# Patient Record
Sex: Male | Born: 1950 | Race: Black or African American | Hispanic: No | Marital: Single | Smoking: Current every day smoker
Health system: Southern US, Community
[De-identification: ages and names within clinical notes are randomized; demographics above are authoritative.]

## PROBLEM LIST (undated history)

## (undated) DIAGNOSIS — J189 Pneumonia, unspecified organism: Secondary | ICD-10-CM

## (undated) DIAGNOSIS — I10 Essential (primary) hypertension: Secondary | ICD-10-CM

## (undated) DIAGNOSIS — N400 Enlarged prostate without lower urinary tract symptoms: Secondary | ICD-10-CM

## (undated) DIAGNOSIS — F1010 Alcohol abuse, uncomplicated: Secondary | ICD-10-CM

## (undated) DIAGNOSIS — B351 Tinea unguium: Secondary | ICD-10-CM

## (undated) DIAGNOSIS — E782 Mixed hyperlipidemia: Secondary | ICD-10-CM

## (undated) HISTORY — DX: Alcohol abuse, uncomplicated: F10.10

## (undated) HISTORY — PX: TUBAL LIGATION: SHX77

---

## 2004-04-02 ENCOUNTER — Encounter: Payer: Self-pay | Admitting: Emergency Medicine

## 2004-04-03 ENCOUNTER — Inpatient Hospital Stay (HOSPITAL_COMMUNITY): Admission: EM | Admit: 2004-04-03 | Discharge: 2004-04-06 | Payer: Self-pay | Admitting: Emergency Medicine

## 2004-04-03 ENCOUNTER — Ambulatory Visit: Payer: Self-pay | Admitting: Internal Medicine

## 2004-04-21 ENCOUNTER — Ambulatory Visit (HOSPITAL_COMMUNITY): Admission: RE | Admit: 2004-04-21 | Discharge: 2004-04-21 | Payer: Self-pay | Admitting: Internal Medicine

## 2004-04-21 ENCOUNTER — Ambulatory Visit: Payer: Self-pay | Admitting: Internal Medicine

## 2004-05-10 ENCOUNTER — Emergency Department (HOSPITAL_COMMUNITY): Admission: EM | Admit: 2004-05-10 | Discharge: 2004-05-10 | Payer: Self-pay | Admitting: Emergency Medicine

## 2004-05-14 ENCOUNTER — Emergency Department (HOSPITAL_COMMUNITY): Admission: EM | Admit: 2004-05-14 | Discharge: 2004-05-14 | Payer: Self-pay | Admitting: Emergency Medicine

## 2004-06-09 ENCOUNTER — Ambulatory Visit: Payer: Self-pay | Admitting: Internal Medicine

## 2004-06-26 ENCOUNTER — Ambulatory Visit (HOSPITAL_COMMUNITY): Admission: RE | Admit: 2004-06-26 | Discharge: 2004-06-26 | Payer: Self-pay | Admitting: Internal Medicine

## 2004-06-26 ENCOUNTER — Ambulatory Visit: Payer: Self-pay | Admitting: Internal Medicine

## 2013-04-10 ENCOUNTER — Emergency Department (HOSPITAL_COMMUNITY): Payer: Self-pay

## 2013-04-10 ENCOUNTER — Emergency Department (HOSPITAL_COMMUNITY)
Admission: EM | Admit: 2013-04-10 | Discharge: 2013-04-10 | Disposition: A | Discharge status: Home / Self Care | Payer: Self-pay | Attending: Emergency Medicine | Admitting: Emergency Medicine

## 2013-04-10 ENCOUNTER — Encounter (HOSPITAL_COMMUNITY): Payer: Self-pay | Admitting: Emergency Medicine

## 2013-04-10 DIAGNOSIS — K118 Other diseases of salivary glands: Secondary | ICD-10-CM | POA: Insufficient documentation

## 2013-04-10 DIAGNOSIS — R911 Solitary pulmonary nodule: Secondary | ICD-10-CM | POA: Insufficient documentation

## 2013-04-10 DIAGNOSIS — F17200 Nicotine dependence, unspecified, uncomplicated: Secondary | ICD-10-CM | POA: Insufficient documentation

## 2013-04-10 LAB — COMPREHENSIVE METABOLIC PANEL
ALT: 8 U/L (ref 0–53)
AST: 15 U/L (ref 0–37)
Albumin: 3.8 g/dL (ref 3.5–5.2)
Alkaline Phosphatase: 74 U/L (ref 39–117)
BUN: 13 mg/dL (ref 6–23)
CO2: 24 mEq/L (ref 19–32)
CREATININE: 0.84 mg/dL (ref 0.50–1.35)
Calcium: 8.7 mg/dL (ref 8.4–10.5)
Chloride: 104 mEq/L (ref 96–112)
GFR calc Af Amer: >90 mL/min (ref >90)
Glucose, Bld: 75 mg/dL (ref 70–99)
Potassium: 4.1 mEq/L (ref 3.7–5.3)
Sodium: 143 mEq/L (ref 137–147)
Total Bilirubin: 0.3 mg/dL (ref 0.3–1.2)
Total Protein: 7.9 g/dL (ref 6.0–8.3)

## 2013-04-10 LAB — CBC WITH DIFFERENTIAL/PLATELET
BASOS PCT: 0 % (ref 0–1)
Basophils Absolute: 0 10*3/uL (ref 0.0–0.1)
Eosinophils Absolute: 0.2 10*3/uL (ref 0.0–0.7)
Eosinophils Relative: 3 % (ref 0–5)
HCT: 40 % (ref 39.0–52.0)
Hemoglobin: 13.6 g/dL (ref 13.0–17.0)
Lymphocytes Relative: 32 % (ref 12–46)
Lymphs Abs: 2.1 10*3/uL (ref 0.7–4.0)
MCH: 30.7 pg (ref 26.0–34.0)
MCHC: 34 g/dL (ref 30.0–36.0)
MCV: 90.3 fL (ref 78.0–100.0)
Monocytes Absolute: 0.7 10*3/uL (ref 0.1–1.0)
Monocytes Relative: 10 % (ref 3–12)
Neutro Abs: 3.6 10*3/uL (ref 1.7–7.7)
Neutrophils Relative %: 55 % (ref 43–77)
Platelets: 201 10*3/uL (ref 150–400)
RBC: 4.43 MIL/uL (ref 4.22–5.81)
RDW: 14.8 % (ref 11.5–15.5)
WBC: 6.5 10*3/uL (ref 4.0–10.5)

## 2013-04-10 MED ORDER — IOHEXOL 300 MG/ML  SOLN
75.0000 mL | Freq: Once | INTRAMUSCULAR | Status: AC | PRN
  Administered 2013-04-10: 75 mL via INTRAVENOUS

## 2013-04-10 NOTE — ED Provider Notes (Signed)
Medical screening examination/treatment/procedure(s) were conducted as a shared visit with non-physician practitioner(s) and myself.  I personally evaluated the patient during the encounter.  EKG Interpretation   None       Patient seen by me. Patient with mass inferior and anterior to the right ear. Not painful nontender no redness concerning for tumor. We'll get CT scan with contrast to further evaluate. Patient states it's been there for only a couple months but is suspicious for having been there for a longer period of time. Patient nontoxic no acute distress. Vital signs normal except for a mild tachycardia.  Mervin Kung, MD 04/10/13 1247

## 2013-04-10 NOTE — ED Provider Notes (Signed)
CSN: [id number]     Arrival date & time 04/10/13  1119 History  This chart was scribed for non-physician practitioner Margarita Mail, PA-C  working with Mervin Kung, MD by Mercy Moore, ED Scribe. This patient was seen in room TR09C/TR09C and the patient's care was started at 12:17 AM.    Chief Complaint  Patient presents with  . Cyst    The history is provided by the patient. No language interpreter was used.   HPI Comments: Robert Molina is a 63 y.o. male who presents to the Emergency Department complaining of constant mass inferior to the right ear that began about 3 months ago. Pt states the area is not painful but has gradually increased in size with time. He denies fever, chills, diaphoresis, sudden weight loss, or sore throat. Pt is a current everyday smoker and occasional alcohol user.   History reviewed. No pertinent past medical history. History reviewed. No pertinent past surgical history. History reviewed. No pertinent family history. History  Substance Use Topics  . Smoking status: Current Every Day Smoker  . Smokeless tobacco: Not on file  . Alcohol Use: Yes    Review of Systems  Constitutional: Negative for fever, chills, diaphoresis and unexpected weight change.  HENT: Negative for sore throat.     Allergies  Review of patient's allergies indicates no known allergies.  Home Medications  No current outpatient prescriptions on file.  Triage Vitals: BP 130/73  Pulse 105  Temp(Src) 98.9 F (37.2 C) (Oral)  Resp 20  Ht 5\' 11"  (1.803 m)  Wt 141 lb 9 oz (64.212 kg)  BMI 19.75 kg/m2  SpO2 96% Physical Exam  Nursing note and vitals reviewed. Constitutional: He is oriented to person, place, and time. He appears well-developed and well-nourished. No distress.  HENT:  Head: Normocephalic and atraumatic.  Approximately 7 cm firm mass inferior to the right ear. It is non-fluctant.   Eyes: Conjunctivae are normal. Right eye exhibits no discharge. Left eye  exhibits no discharge.  Neck: Normal range of motion.  Cardiovascular: Normal rate, regular rhythm and normal heart sounds.   Pulmonary/Chest: Effort normal and breath sounds normal. No respiratory distress. He has no wheezes. He has no rales.  Musculoskeletal: Normal range of motion. He exhibits no edema.  Lymphadenopathy:       Right: Supraclavicular adenopathy present.  Neurological: He is alert and oriented to person, place, and time.  Skin: Skin is warm and dry.  Psychiatric: He has a normal mood and affect. Thought content normal.    ED Course  Procedures (including critical care time) DIAGNOSTIC STUDIES: Oxygen Saturation is 96% on room air, adequate by my interpretation.    COORDINATION OF CARE: 12:21 AM- Pt advised of plan for treatment and pt agrees.   Labs Review Labs Reviewed - No data to display Imaging Review Dg Chest 2 View  04/10/2013   CLINICAL DATA:  Swelling to the right side of the neck, hypertension, history of smoking  EXAM: CHEST  2 VIEW  COMPARISON:  04/21/2004; 04/05/2004; 04/02/2004  FINDINGS: Grossly unchanged cardiac silhouette and mediastinal contours. The lungs are hyperexpanded with flattening of the bilateral hemidiaphragms. Interval resolution of previously noted heterogeneous airspace opacities within the peripheral aspect of the right mid lung. No new focal airspace opacities. No pleural effusion or pneumothorax. No definite evidence of edema. Grossly unchanged bones.  IMPRESSION: Hyperexpanded lungs without acute cardiopulmonary disease.   Electronically Signed   By: Sandi Mariscal M.D.   On: 04/10/2013 15:02  Ct Soft Tissue Neck W Contrast  04/10/2013   CLINICAL DATA:  Patient complains of increasing swelling below right ear that began 3 months ago.  EXAM: CT NECK WITH CONTRAST  TECHNIQUE: Multidetector CT imaging of the neck was performed using the standard protocol following the bolus administration of intravenous contrast.  CONTRAST:  48mL OMNIPAQUE  IOHEXOL 300 MG/ML  SOLN  COMPARISON:  None.  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 Hounsfield attenuation approximately 30 HU. There is nodular enhancement at the inferior and medial aspect of the mass seen best on coronal series 6 images 43-47. There is stranding of the subcutaneous fat inferior to the lesion. This appears to represent edema rather than an infiltrating type tumor. Parapharyngeal fat is preserved. No visible skull base lesion or stylomastoid foramen enlargement. No other similar lesions in the right parotid gland or on the left. Normal-appearing submandibular glands.  Pathologic adenopathy seen in the right neck including, see for instance series 3 enlarged level IIA (12 mm image 40), non-enlarged but enhancing level IIB and enlarged, right level IV supraclavicular nodes (17 mm image 94, 15 mm image 84). Concern raised for regional metastatic disease.  Moderate atheromatous change at the great vessel origins and carotid bifurcations. Cervical spondylosis. No thyroid lesion. Severe COPD.  4 mm noncalcified pulmonary nodule right lung apex unlikely to represent a metastatic lesion; given the patient is at high risk for bronchogenic carcinoma, follow-up chest CT at 1 year is recommended. This recommendation follows the consensus statement: Guidelines for Management of Small Pulmonary Nodules Detected on CT Scans: A Statement from the [employer] as published in Radiology 2005; 237:395-400.  IMPRESSION: 2.3 x 2.4 x 3.3 cm tail of parotid lesion on the right. Leading differential considerations in this older male include mucoepidermoid carcinoma versus solitary Warthin tumor. Given the regional adenopathy and inferior stranding, malignancy is favored. Less likely considerations would include malignant mixed tumor or adenoid cystic carcinoma.  If further investigation is desired, elective MRI of the Face/Neck  without and with contrast could provide additional information, with particular regard to detection of perineural tumor spread.  4 mm noncalcified nodule right lung apex.  See discussion above.  ENT consultation with tissue sampling is warranted.   Electronically Signed   By: Rolla Flatten M.D.   On: 04/10/2013 15:35    EKG Interpretation   None       MDM   1. Mass of parotid gland   2. Lung nodule    This patient comes in with complaint of neck mass. Palpable supraclavicular nodes. Patient is seen in shared visit with Dr. Rogene Houston. Awaiting imaging of neck mass as it is concerning for possible cancer.  CT shows parotid mass. Extension into lymphatic chain. I have discussed the findings with Dr. Rogene Houston, and Dr. Loura Pardon who is on for ENT>  The patient is to follow up with [facility]NT for film review and biopsy. I have discussed the findings of the CT scan with the patient and the differential ranging form possible cancer to benign lesion. Biopsy will  Be done to discern the mass more clearly. i have also discussed the lung nodule and need for follow up CT scan in 1 yr, Patient expresses understanding and agrees with poc. I personally performed the services described in this documentation, which was scribed in my presence. The recorded information has been reviewed and is accurate.  Margarita Mail, PA-C 04/11/13 1815

## 2013-04-10 NOTE — ED Notes (Signed)
Pt c/o right ear cyst x several months that is not painful per pt; pt sts has gotten some larger

## 2013-04-10 NOTE — Discharge Instructions (Signed)
Please follow up closely with ENT for a biopsy. Regarding your lung nodule, you will need to have a repeat chest CT in one year to monitor this and make sure it is not growing. If your have any difficulty getting your appointment, please call back and speak with the flow manager in the ED. Pulmonary Nodule A pulmonary nodule is a small, round growth of tissue in the lung. Pulmonary nodules can range in size from less than 1/5 inch (4 mm) to a little bigger than an inch (25 mm). Most pulmonary nodules are detected when imaging tests of the lung are being performed for a different problem. Pulmonary nodules are usually not cancerous (benign). However, some pulmonary nodules are cancerous (malignant). Follow-up treatment or testing is based on the size of the pulmonary nodule and your risk of getting lung cancer.  CAUSES Benign pulmonary nodules can be caused by various things. Some of the causes include:   Bacterial, fungal, or viral infections. This is usually an old infection that is no longer active, but it can sometimes be a current, active infection.  A benign mass of tissue.  Inflammation from conditions such as rheumatoid arthritis.   Abnormal blood vessels in the lungs. Malignant pulmonary nodules can result from lung cancer or from cancers that spread to the lung from other places in the body. SIGNS AND SYMPTOMS Pulmonary nodules usually do not cause symptoms. DIAGNOSIS Most often, pulmonary nodules are found incidentally when an X-ray or CT scan is performed to look for some other problem in the lung area. To help determine whether a pulmonary nodule is benign or malignant, your health care provider will take a medical history and order a variety of tests. Tests done may include:   Blood tests.  A skin test called a tuberculin test. This test is used to determine if you have been exposed to the germ that causes tuberculosis.   Chest X-rays. If possible, a new X-ray may be compared  with X-rays you have had in the past.   CT scan. This test shows smaller pulmonary nodules more clearly than an X-ray.   Positron emission tomography (PET) scan. In this test, a safe amount of a radioactive substance is injected into the bloodstream. Then, the scan takes a picture of the pulmonary nodule. The radioactive substance is eliminated from your body in your urine.   Biopsy. A tiny piece of the pulmonary nodule is removed so it can be checked under a microscope. TREATMENT  Pulmonary nodules that are benign normally do not require any treatment because they usually do not cause symptoms or breathing problems. Your health care provider may want to monitor the pulmonary nodule through follow-up CT scans. The frequency of these CT scans will vary based on the size of the nodule and the risk factors for lung cancer. For example, CT scans will need to be done more frequently if the pulmonary nodule is larger and if you have a history of smoking and a family history of cancer. Further testing or biopsies may be done if any follow-up CT scan shows that the size of the pulmonary nodule has increased. HOME CARE INSTRUCTIONS  Only take over-the-counter or prescription medicines as directed by your health care provider.  Keep all follow-up appointments with your health care provider. SEEK MEDICAL CARE IF:  You have trouble breathing when you are active.   You feel sick or unusually tired.   You do not feel like eating.   You lose weight without  trying to.   You develop chills or night sweats.  SEEK IMMEDIATE MEDICAL CARE IF:  You cannot catch your breath, or you begin wheezing.   You cannot stop coughing.   You cough up blood.   You become dizzy or feel like you are going to pass out.   You have sudden chest pain.   You have a fever or persistent symptoms for more than 2 3 days.   You have a fever and your symptoms suddenly get worse. MAKE SURE YOU:  Understand  these instructions.  Will watch your condition.  Will get help right away if you are not doing well or get worse. Document Released: 01/21/2009 Document Revised: 11/26/2012 Document Reviewed: 09/15/2012 [facility] Patient Information 2014 [place].

## 2013-04-16 ENCOUNTER — Other Ambulatory Visit (HOSPITAL_COMMUNITY)
Admission: RE | Admit: 2013-04-16 | Discharge: 2013-04-16 | Disposition: A | Discharge status: Home / Self Care | Payer: Self-pay | Source: Ambulatory Visit | Attending: Otolaryngology | Admitting: Otolaryngology

## 2013-04-16 ENCOUNTER — Other Ambulatory Visit: Payer: Self-pay | Admitting: Otolaryngology

## 2013-04-16 ENCOUNTER — Other Ambulatory Visit (HOSPITAL_COMMUNITY): Payer: Self-pay | Admitting: Otolaryngology

## 2013-04-16 DIAGNOSIS — K119 Disease of salivary gland, unspecified: Secondary | ICD-10-CM | POA: Insufficient documentation

## 2013-04-30 ENCOUNTER — Encounter (HOSPITAL_COMMUNITY)
Admission: RE | Admit: 2013-04-30 | Discharge: 2013-04-30 | Disposition: A | Discharge status: Home / Self Care | Payer: Self-pay | Source: Ambulatory Visit | Attending: Otolaryngology | Admitting: Otolaryngology

## 2013-04-30 ENCOUNTER — Encounter (HOSPITAL_COMMUNITY): Payer: Self-pay

## 2013-04-30 DIAGNOSIS — Z01818 Encounter for other preprocedural examination: Secondary | ICD-10-CM | POA: Insufficient documentation

## 2013-04-30 DIAGNOSIS — Z01812 Encounter for preprocedural laboratory examination: Secondary | ICD-10-CM | POA: Insufficient documentation

## 2013-04-30 HISTORY — DX: Pneumonia, unspecified organism: J18.9

## 2013-04-30 LAB — CBC
HCT: 41 % (ref 39.0–52.0)
Hemoglobin: 13.8 g/dL (ref 13.0–17.0)
MCH: 30.5 pg (ref 26.0–34.0)
MCHC: 33.7 g/dL (ref 30.0–36.0)
MCV: 90.7 fL (ref 78.0–100.0)
Platelets: 305 10*3/uL (ref 150–400)
RBC: 4.52 MIL/uL (ref 4.22–5.81)
RDW: 14.9 % (ref 11.5–15.5)
WBC: 3.9 10*3/uL — ABNORMAL LOW (ref 4.0–10.5)

## 2013-04-30 NOTE — Progress Notes (Addendum)
Pt doesn't have a cardiologist  Denies ever having an echo/stress test/heart cath  Pt doesn't have a medical md   CXR in epic from 04-10-13

## 2013-04-30 NOTE — Pre-Procedure Instructions (Signed)
Robert Molina  04/30/2013   Your procedure is scheduled on:  Wed, Jan 28 @ 1:00 PM  Report to [facility]  at 11:00 AM.  Call this number if you have problems the morning of surgery: [phone]   Remember:   Do not eat food or drink liquids after midnight.      Do not wear jewelry  Do not wear lotions, powders, or colognes. You may wear deodorant.  Men may shave face and neck.  Do not bring valuables to the hospital.  [facility] is not responsible                  for any belongings or valuables.               Contacts, dentures or bridgework may not be worn into surgery.  Leave suitcase in the car. After surgery it may be brought to your room.  For patients admitted to the hospital, discharge time is determined by your                treatment team.               Patients discharged the day of surgery will not be allowed to drive  home.    Special Instructions: Shower using CHG 2 nights before surgery and the night before surgery.  If you shower the day of surgery use CHG.  Use special wash - you have one bottle of CHG for all showers.  You should use approximately 1/3 of the bottle for each shower.   Please read over the following fact sheets that you were given: Pain Booklet, Coughing and Deep Breathing and Surgical Site Infection Prevention

## 2013-05-02 NOTE — H&P (Signed)
05/06/13 12:37 PM  Ruby Cola  PREOPERATIVE HISTORY AND PHYSICAL  CHIEF COMPLAINT: right parotid mass, lymphadenopathy, laryngeal masses  HISTORY: This is a 63yo AAM who presented with a right parotid mass that had been enlarging for several weeks. CT neck showed a right ~ 3cm tail of parotid mass and right cervical adenopathy. FNA was inconclusive and showed cyst contents.  He now presents for right superficial parotidectomy with facial nerve monitoring, and possible right selective neck dissection and direct laryngoscopy with possible biopsy.  Dr. Simeon Craft, Alroy Dust has discussed the risks (bleeding, infection, injury to CN VII, IX, X, XI, XII, facial paralysis or weakness, dental injury, need for revision surgery, mass recurrence, etc. Risks of general anesthesia), benefits, and alternatives of this procedure. The patient understands the risks and would like to proceed with the procedure. The chances of success of the procedure are >50% and the patient understands this. I personally performed an examination of the patient within 24 hours of the procedure.  PAST MEDICAL HISTORY: Past Medical History  Diagnosis Date  . Pneumonia     hx of about  41yrs ago    PAST SURGICAL HISTORY: No past surgical history on file.  MEDICATIONS: No current facility-administered medications on file prior to encounter.   No current outpatient prescriptions on file prior to encounter.    ALLERGIES: No Known Allergies  SOCIAL HISTORY: History   Social History  . Marital Status: Single    Spouse Name: N/A    Number of Children: N/A  . Years of Education: N/A   Occupational History  . Not on file.   Social History Main Topics  . Smoking status: Current Every Day Smoker -- 1.00 packs/day for 44 years    Types: Cigarettes  . Smokeless tobacco: Not on file  . Alcohol Use: Yes     Comment: daily  1-2 beers  . Drug Use: No  . Sexual Activity: Not on file   Other Topics Concern  . Not on file    Social History Narrative  . No narrative on file   FAMILY HISTORY: No family history on file.  REVIEW OF SYSTEMS:  HEENT: right neck mass/swelling, occasional hoarseness, otherwise negative x 12 systems except per HPI.   PHYSICAL EXAM:  GENERAL:  NAD VITAL SIGNS:  There were no vitals filed for this visit. SKIN:  Warm, dry HEENT:  Laryngoscopy in office showed laryngeal mucosal changes NECK/LYMPH: palpable right tail of parotid mass with possible right lymphadenopathy  LUNGS:  Grossly clear CARDIOVASCULAR:  RRR ABDOMEN:  Soft, NT MUSCULOSKELETAL: normal strength PSYCH:  Normal affect NEUROLOGIC: CN 2-12 intact and symmetric, facial nerve House-Brackman 1/6 bilaterally in all divisions.   DIAGNOSTIC STUDIES: CT shows right parotid mass with possible right cervical adenopathy  ASSESSMENT AND PLAN: Plan to proceed with right superficial parotidectomy with facial nerve dissection and monitoring, possible right selective neck dissection, possible direct laryngoscopy with biopsy. Right neck marked by Dr. Simeon Craft. Patient understands the risks, benefits, and alternatives.  Ruby Cola 05/06/2013 12:38 PM

## 2013-05-06 ENCOUNTER — Encounter (HOSPITAL_COMMUNITY): Payer: Self-pay | Admitting: Certified Registered Nurse Anesthetist

## 2013-05-06 ENCOUNTER — Encounter (HOSPITAL_COMMUNITY)
Admission: RE | Disposition: A | Discharge status: Home / Self Care | Payer: Self-pay | Source: Ambulatory Visit | Attending: Otolaryngology

## 2013-05-06 ENCOUNTER — Inpatient Hospital Stay (HOSPITAL_COMMUNITY): Payer: Self-pay | Admitting: Certified Registered Nurse Anesthetist

## 2013-05-06 ENCOUNTER — Inpatient Hospital Stay (HOSPITAL_COMMUNITY)
Admission: RE | Admit: 2013-05-06 | Discharge: 2013-05-08 | DRG: 134 | Disposition: A | Discharge status: Home / Self Care | Payer: Self-pay | Source: Ambulatory Visit | Attending: Otolaryngology | Admitting: Otolaryngology

## 2013-05-06 ENCOUNTER — Encounter (HOSPITAL_COMMUNITY): Payer: Self-pay | Admitting: *Deleted

## 2013-05-06 DIAGNOSIS — Z23 Encounter for immunization: Secondary | ICD-10-CM

## 2013-05-06 DIAGNOSIS — F17200 Nicotine dependence, unspecified, uncomplicated: Secondary | ICD-10-CM | POA: Diagnosis present

## 2013-05-06 DIAGNOSIS — D141 Benign neoplasm of larynx: Secondary | ICD-10-CM | POA: Diagnosis present

## 2013-05-06 DIAGNOSIS — D119 Benign neoplasm of major salivary gland, unspecified: Principal | ICD-10-CM | POA: Diagnosis present

## 2013-05-06 DIAGNOSIS — R599 Enlarged lymph nodes, unspecified: Secondary | ICD-10-CM | POA: Diagnosis present

## 2013-05-06 DIAGNOSIS — K118 Other diseases of salivary glands: Secondary | ICD-10-CM | POA: Diagnosis present

## 2013-05-06 HISTORY — PX: PAROTIDECTOMY: SHX2163

## 2013-05-06 HISTORY — PX: RADICAL NECK DISSECTION: SHX2284

## 2013-05-06 SURGERY — EXCISION, PAROTID GLAND
Anesthesia: General | Site: Neck | Laterality: Right

## 2013-05-06 MED ORDER — FENTANYL CITRATE 0.05 MG/ML IJ SOLN
INTRAMUSCULAR | Status: DC | PRN
  Administered 2013-05-06 (×4): 50 ug via INTRAVENOUS
  Administered 2013-05-06: 100 ug via INTRAVENOUS
  Administered 2013-05-06: 50 ug via INTRAVENOUS

## 2013-05-06 MED ORDER — HYDROMORPHONE HCL PF 1 MG/ML IJ SOLN: 0.2500 mg | INTRAMUSCULAR | Status: DC | PRN

## 2013-05-06 MED ORDER — BACITRACIN ZINC 500 UNIT/GM EX OINT
TOPICAL_OINTMENT | CUTANEOUS | Status: AC
  Filled 2013-05-06: qty 15

## 2013-05-06 MED ORDER — DIPHENHYDRAMINE HCL 50 MG/ML IJ SOLN: 12.5000 mg | Freq: Four times a day (QID) | INTRAMUSCULAR | Status: DC | PRN

## 2013-05-06 MED ORDER — GLYCOPYRROLATE 0.2 MG/ML IJ SOLN
INTRAMUSCULAR | Status: AC
  Filled 2013-05-06: qty 1

## 2013-05-06 MED ORDER — DEXAMETHASONE SODIUM PHOSPHATE 10 MG/ML IJ SOLN
INTRAMUSCULAR | Status: AC
  Filled 2013-05-06: qty 1

## 2013-05-06 MED ORDER — ACETAMINOPHEN 650 MG RE SUPP: 650.0000 mg | Freq: Four times a day (QID) | RECTAL | Status: DC | PRN

## 2013-05-06 MED ORDER — CEFAZOLIN SODIUM 1-5 GM-% IV SOLN
1.0000 g | Freq: Three times a day (TID) | INTRAVENOUS | Status: DC
  Administered 2013-05-06 – 2013-05-08 (×5): 1 g via INTRAVENOUS
  Filled 2013-05-06 (×7): qty 50

## 2013-05-06 MED ORDER — LIDOCAINE-EPINEPHRINE 1 %-1:100000 IJ SOLN
INTRAMUSCULAR | Status: DC | PRN
  Administered 2013-05-06: 10 mL

## 2013-05-06 MED ORDER — DOCUSATE SODIUM 100 MG PO CAPS: 100.0000 mg | ORAL_CAPSULE | Freq: Two times a day (BID) | ORAL | Status: DC | PRN

## 2013-05-06 MED ORDER — OXYCODONE HCL 5 MG PO TABS: 5.0000 mg | ORAL_TABLET | Freq: Once | ORAL | Status: DC | PRN

## 2013-05-06 MED ORDER — DIPHENHYDRAMINE HCL 12.5 MG/5ML PO ELIX: 12.5000 mg | ORAL_SOLUTION | Freq: Four times a day (QID) | ORAL | Status: DC | PRN

## 2013-05-06 MED ORDER — PROPOFOL 10 MG/ML IV BOLUS
INTRAVENOUS | Status: DC | PRN
  Administered 2013-05-06: 40 mg via INTRAVENOUS
  Administered 2013-05-06: 170 mg via INTRAVENOUS

## 2013-05-06 MED ORDER — FENTANYL CITRATE 0.05 MG/ML IJ SOLN
INTRAMUSCULAR | Status: AC
  Filled 2013-05-06: qty 5

## 2013-05-06 MED ORDER — DEXTROSE 5 % IV SOLN
10.0000 mg | INTRAVENOUS | Status: DC | PRN
  Administered 2013-05-06: 15 ug/min via INTRAVENOUS

## 2013-05-06 MED ORDER — PROPOFOL 10 MG/ML IV BOLUS
INTRAVENOUS | Status: AC
  Filled 2013-05-06: qty 20

## 2013-05-06 MED ORDER — SUCCINYLCHOLINE CHLORIDE 20 MG/ML IJ SOLN
INTRAMUSCULAR | Status: DC | PRN
  Administered 2013-05-06: 100 mg via INTRAVENOUS
  Administered 2013-05-06: 60 mg via INTRAVENOUS

## 2013-05-06 MED ORDER — LIDOCAINE HCL (CARDIAC) 20 MG/ML IV SOLN
INTRAVENOUS | Status: AC
  Filled 2013-05-06: qty 5

## 2013-05-06 MED ORDER — 0.9 % SODIUM CHLORIDE (POUR BTL) OPTIME
TOPICAL | Status: DC | PRN
  Administered 2013-05-06: 1000 mL

## 2013-05-06 MED ORDER — LIDOCAINE HCL (CARDIAC) 20 MG/ML IV SOLN
INTRAVENOUS | Status: DC | PRN
  Administered 2013-05-06: 40 mg via INTRAVENOUS

## 2013-05-06 MED ORDER — DEXAMETHASONE SODIUM PHOSPHATE 4 MG/ML IJ SOLN
INTRAMUSCULAR | Status: DC | PRN
  Administered 2013-05-06: 10 mg via INTRAVENOUS

## 2013-05-06 MED ORDER — ONDANSETRON HCL 4 MG/2ML IJ SOLN
INTRAMUSCULAR | Status: AC
Start: 2013-05-06 — End: 2013-05-06
  Filled 2013-05-06: qty 2

## 2013-05-06 MED ORDER — CEFAZOLIN SODIUM 1-5 GM-% IV SOLN
1.0000 g | Freq: Once | INTRAVENOUS | Status: AC
  Administered 2013-05-06: 1 g via INTRAVENOUS

## 2013-05-06 MED ORDER — SUCCINYLCHOLINE CHLORIDE 20 MG/ML IJ SOLN
INTRAMUSCULAR | Status: AC
  Filled 2013-05-06: qty 1

## 2013-05-06 MED ORDER — HYDROCODONE-ACETAMINOPHEN 5-325 MG PO TABS
1.0000 | ORAL_TABLET | ORAL | Status: DC | PRN
  Administered 2013-05-07: 2 via ORAL
  Filled 2013-05-06: qty 2

## 2013-05-06 MED ORDER — DEXAMETHASONE SODIUM PHOSPHATE 10 MG/ML IJ SOLN: 10.0000 mg | Freq: Once | INTRAMUSCULAR | Status: DC

## 2013-05-06 MED ORDER — OXYMETAZOLINE HCL 0.05 % NA SOLN
NASAL | Status: AC
  Filled 2013-05-06: qty 15

## 2013-05-06 MED ORDER — MIDAZOLAM HCL 5 MG/5ML IJ SOLN
INTRAMUSCULAR | Status: DC | PRN
  Administered 2013-05-06: 2 mg via INTRAVENOUS

## 2013-05-06 MED ORDER — MIDAZOLAM HCL 2 MG/2ML IJ SOLN
INTRAMUSCULAR | Status: AC
  Filled 2013-05-06: qty 2

## 2013-05-06 MED ORDER — EPHEDRINE SULFATE 50 MG/ML IJ SOLN
INTRAMUSCULAR | Status: DC | PRN
  Administered 2013-05-06 (×2): 5 mg via INTRAVENOUS

## 2013-05-06 MED ORDER — EPINEPHRINE HCL (NASAL) 0.1 % NA SOLN
NASAL | Status: AC
  Filled 2013-05-06: qty 30

## 2013-05-06 MED ORDER — OXYCODONE HCL 5 MG/5ML PO SOLN: 5.0000 mg | Freq: Once | ORAL | Status: DC | PRN

## 2013-05-06 MED ORDER — KCL IN DEXTROSE-NACL 10-5-0.45 MEQ/L-%-% IV SOLN
INTRAVENOUS | Status: DC
  Administered 2013-05-06 – 2013-05-08 (×3): via INTRAVENOUS
  Filled 2013-05-06 (×5): qty 1000

## 2013-05-06 MED ORDER — MORPHINE SULFATE 2 MG/ML IJ SOLN: 1.0000 mg | INTRAMUSCULAR | Status: DC | PRN

## 2013-05-06 MED ORDER — LACTATED RINGERS IV SOLN
INTRAVENOUS | Status: DC
  Administered 2013-05-06 (×2): via INTRAVENOUS

## 2013-05-06 MED ORDER — PROPOFOL 10 MG/ML IV BOLUS
INTRAVENOUS | Status: AC
Start: 2013-05-06 — End: 2013-05-06
  Filled 2013-05-06: qty 20

## 2013-05-06 MED ORDER — ONDANSETRON HCL 4 MG/2ML IJ SOLN: 4.0000 mg | Freq: Four times a day (QID) | INTRAMUSCULAR | Status: DC | PRN

## 2013-05-06 MED ORDER — ROCURONIUM BROMIDE 50 MG/5ML IV SOLN
INTRAVENOUS | Status: AC
  Filled 2013-05-06: qty 1

## 2013-05-06 MED ORDER — ONDANSETRON HCL 4 MG/2ML IJ SOLN: 4.0000 mg | Freq: Once | INTRAMUSCULAR | Status: DC | PRN

## 2013-05-06 MED ORDER — LIDOCAINE-EPINEPHRINE 1 %-1:100000 IJ SOLN
INTRAMUSCULAR | Status: AC
  Filled 2013-05-06: qty 1

## 2013-05-06 MED ORDER — PHENOL 1.4 % MT LIQD: 2.0000 | Freq: Three times a day (TID) | OROMUCOSAL | Status: DC | PRN

## 2013-05-06 MED ORDER — GLYCOPYRROLATE 0.2 MG/ML IJ SOLN
INTRAMUSCULAR | Status: DC | PRN
  Administered 2013-05-06: 0.2 mg via INTRAVENOUS

## 2013-05-06 MED ORDER — ACETAMINOPHEN 325 MG PO TABS: 650.0000 mg | ORAL_TABLET | Freq: Four times a day (QID) | ORAL | Status: DC | PRN

## 2013-05-06 MED ORDER — LIDOCAINE-EPINEPHRINE 1 %-1:100000 IJ SOLN
INTRAMUSCULAR | Status: AC
  Filled 2013-05-06: qty 2

## 2013-05-06 MED ORDER — CEFAZOLIN SODIUM 1-5 GM-% IV SOLN
INTRAVENOUS | Status: AC
  Filled 2013-05-06: qty 50

## 2013-05-06 SURGICAL SUPPLY — 75 items
ADH SKN CLS APL DERMABOND .7 (GAUZE/BANDAGES/DRESSINGS) ×2
APPLIER CLIP 9.375 SM OPEN (CLIP) ×8
APR CLP SM 9.3 20 MLT OPN (CLIP) ×4
ATTRACTOMAT 16X20 MAGNETIC DRP (DRAPES) IMPLANT
BLADE SURG 12 STRL SS (BLADE) IMPLANT
BLADE SURG 15 STRL LF DISP TIS (BLADE) ×2 IMPLANT
BLADE SURG 15 STRL SS (BLADE) ×4
BLADE SURG ROTATE 9660 (MISCELLANEOUS) IMPLANT
CANISTER SUCTION 2500CC (MISCELLANEOUS) ×4 IMPLANT
CLEANER TIP ELECTROSURG 2X2 (MISCELLANEOUS) ×4 IMPLANT
CLIP APPLIE 9.375 SM OPEN (CLIP) ×4 IMPLANT
CLOTH BEACON ORANGE TIMEOUT ST (SAFETY) ×2 IMPLANT
CONT SPEC 4OZ CLIKSEAL STRL BL (MISCELLANEOUS) ×8 IMPLANT
CONT SPEC STER OR (MISCELLANEOUS) ×2 IMPLANT
CORDS BIPOLAR (ELECTRODE) ×4 IMPLANT
COVER SURGICAL LIGHT HANDLE (MISCELLANEOUS) ×4 IMPLANT
CRADLE DONUT ADULT HEAD (MISCELLANEOUS) IMPLANT
DERMABOND ADVANCED (GAUZE/BANDAGES/DRESSINGS) ×2
DERMABOND ADVANCED .7 DNX12 (GAUZE/BANDAGES/DRESSINGS) IMPLANT
DRAIN CHANNEL 15F RND FF W/TCR (WOUND CARE) IMPLANT
DRAIN HEMOVAC 1/8 X 5 (WOUND CARE) IMPLANT
DRAIN JACKSON PRATT 10MM FLAT (MISCELLANEOUS) ×2 IMPLANT
DRAIN JACKSON RD 7FR 3/32 (WOUND CARE) IMPLANT
DRAIN PENROSE 1/4X12 LTX STRL (WOUND CARE) IMPLANT
DRAIN SNY 10 ROU (WOUND CARE) IMPLANT
ELECT COATED BLADE 2.86 ST (ELECTRODE) ×4 IMPLANT
ELECT REM PT RETURN 9FT ADLT (ELECTROSURGICAL) ×4
ELECTRODE REM PT RTRN 9FT ADLT (ELECTROSURGICAL) ×2 IMPLANT
EVACUATOR SILICONE 100CC (DRAIN) ×4 IMPLANT
GAUZE SPONGE 4X4 16PLY XRAY LF (GAUZE/BANDAGES/DRESSINGS) ×6 IMPLANT
GLOVE BIO SURGEON STRL SZ 6.5 (GLOVE) ×1 IMPLANT
GLOVE BIO SURGEONS STRL SZ 6.5 (GLOVE) ×1
GLOVE SURG SS PI 7.5 STRL IVOR (GLOVE) ×4 IMPLANT
GLOVE SURG SS PI 8.0 STRL IVOR (GLOVE) ×2 IMPLANT
GOWN STRL NON-REIN LRG LVL3 (GOWN DISPOSABLE) ×8 IMPLANT
KIT BASIN OR (CUSTOM PROCEDURE TRAY) ×4 IMPLANT
KIT ROOM TURNOVER OR (KITS) ×4 IMPLANT
LOCATOR NERVE 3 VOLT (DISPOSABLE) IMPLANT
NDL HYPO 25GX1X1/2 BEV (NEEDLE) ×2 IMPLANT
NEEDLE HYPO 25GX1X1/2 BEV (NEEDLE) ×4 IMPLANT
NS IRRIG 1000ML POUR BTL (IV SOLUTION) ×4 IMPLANT
PAD ARMBOARD 7.5X6 YLW CONV (MISCELLANEOUS) ×8 IMPLANT
PENCIL BUTTON HOLSTER BLD 10FT (ELECTRODE) ×4 IMPLANT
PROBE NERVBE PRASS .33 (MISCELLANEOUS) ×2 IMPLANT
SHEARS HARMONIC 9CM CVD (BLADE) ×4 IMPLANT
SPONGE INTESTINAL PEANUT (DISPOSABLE) ×2 IMPLANT
STAPLER VISISTAT 35W (STAPLE) ×2 IMPLANT
SUT CHROMIC 3 0 SH 27 (SUTURE) IMPLANT
SUT CHROMIC 4 0 PS 2 18 (SUTURE) ×6 IMPLANT
SUT ETHILON 2 0 FS 18 (SUTURE) ×2 IMPLANT
SUT ETHILON 3 0 PS 1 (SUTURE) ×2 IMPLANT
SUT MNCRL AB 4-0 PS2 18 (SUTURE) IMPLANT
SUT PLAIN 5 0 P 3 18 (SUTURE) ×2 IMPLANT
SUT SILK 2 0 (SUTURE) ×4
SUT SILK 2 0 FS (SUTURE) IMPLANT
SUT SILK 2 0 SH CR/8 (SUTURE) ×4 IMPLANT
SUT SILK 2-0 18XBRD TIE 12 (SUTURE) ×2 IMPLANT
SUT SILK 3 0 SH CR/8 (SUTURE) IMPLANT
SUT VIC AB 3-0 SH 18 (SUTURE) IMPLANT
SUT VIC AB 3-0 SH 8-18 (SUTURE) ×2 IMPLANT
SUT VICRYL 3 0 8 18 UR 6 D8304 (SUTURE) IMPLANT
SUT VICRYL 4 0 EN S 48 D7796 (SUTURE) IMPLANT
SYR BULB 3OZ (MISCELLANEOUS) IMPLANT
SYRINGE 10CC LL (SYRINGE) ×4 IMPLANT
TOWEL OR 17X24 6PK STRL BLUE (TOWEL DISPOSABLE) ×4 IMPLANT
TOWEL OR 17X26 10 PK STRL BLUE (TOWEL DISPOSABLE) ×6 IMPLANT
TRAY ENT MC OR (CUSTOM PROCEDURE TRAY) ×4 IMPLANT
TRAY FOLEY CATH 14FRSI W/METER (CATHETERS) ×2 IMPLANT
TRAY FOLEY CATH 16FR SILVER (SET/KITS/TRAYS/PACK) ×2 IMPLANT
TUBE ENDOTRAC EMG 7X10.2 (MISCELLANEOUS) IMPLANT
TUBE ENDOTRAC EMG 8X11.3 (MISCELLANEOUS) IMPLANT
TUBE ENDOTRACH  EMG 6MMTUBE EN (MISCELLANEOUS)
TUBE ENDOTRACH EMG 6MMTUBE EN (MISCELLANEOUS) IMPLANT
TUBING BULK SUCTION (MISCELLANEOUS) ×2 IMPLANT
WATER STERILE IRR 1000ML POUR (IV SOLUTION) ×4 IMPLANT

## 2013-05-06 NOTE — Anesthesia Preprocedure Evaluation (Signed)
Anesthesia Evaluation  Patient identified by MRN, date of birth, ID band Patient awake    Reviewed: Allergy & Precautions, H&P , NPO status , Patient's Chart, lab work & pertinent test results  Airway Mallampati: II TM Distance: >3 FB     Dental  (+) Poor Dentition, Loose and Dental Advisory Given,    Pulmonary Current Smoker,  + rhonchi         Cardiovascular Rhythm:Regular Rate:Normal     Neuro/Psych    GI/Hepatic   Endo/Other    Renal/GU      Musculoskeletal   Abdominal   Peds  Hematology   Anesthesia Other Findings   Reproductive/Obstetrics                           Anesthesia Physical Anesthesia Plan  ASA: III  Anesthesia Plan: General   Post-op Pain Management:    Induction: Intravenous  Airway Management Planned: Oral ETT  Additional Equipment:   Intra-op Plan:   Post-operative Plan: Extubation in OR  Informed Consent: I have reviewed the patients History and Physical, chart, labs and discussed the procedure including the risks, benefits and alternatives for the proposed anesthesia with the patient or authorized representative who has indicated his/her understanding and acceptance.   Dental advisory given  Plan Discussed with: CRNA and Anesthesiologist  Anesthesia Plan Comments: (R. Parotid mass Smoker/COPD  Plan GA with oral ETT  Roberts Gaudy, MD )        Anesthesia Quick Evaluation

## 2013-05-06 NOTE — Op Note (Signed)
05/06/2013 4:37 PM Surgeon: Ruby Cola Assistant:Louise Sallee Provencal, PA-C Preoperative Diagnosis: right  parotid mass, right cervical lymphadenopathy, right vocal fold mucosal lesion Postoperative Diagnosis: right  parotid mass, right cervical lymphadenopathy, right vocal fold mucosal lesion Procedures Performed: direct laryngoscopy with biopsy 31535, right open cervical lymph node biopsy 38510-right, right superficial parotidectomy with facial nerve dissection and preservation 42415-right EBL: less then 154mL Anesthesia: General Via Endotracheal Complications: none Operative Findings: identified and preserve the right facial nerve in all divisions, enlarged right level II lymph node removed and sent for pathology, right vocal fold mucosal lesion, otherwise normal appearing vocal folds. Specimens: right superficial parotidectomy with parotid mass, right cervical lymph node, right vocal fold biopsy. Drains: right 25mm JP drain  Operative Details: The patient was brought to the operating room, placed supine on the OR table, and intubated without difficulty by anesthesia. The patient was prepped and draped and the facial nerve electrodes and monitor were placed on the right in the standard fashion. A standard modified Blair incision was made on the right side with the incision extending behind the earlobe and down slightly into the neck. Anterior and posterior supraplatysmal skin flaps were developed. The parotidomasseteric fascia overlying the parotid gland was left intact.  Posteriorly, the mastoid process and the anterior edge of the sternocleidomastoid were identified. With blunt dissection, a small curved clamp was used to separate the gland from the mastoid process and the sternocleidomastoid muscle. The cartilage of the external auditory canal and the tragal pointer were identified. Bleeding was treated with bipolar cautery. Using a mosquito clamp the temporoparotid fascia was carefully elevated  and transsected. Then the main trunk of the right facial nerve was visualized coming out of the stylomastoid foramen and dissected out. The frontotemporal and zygomatic branches were dissected out using the McCabe and bipolar. Next the buccal, marginal mandibular, and cervical branches were sequentially dissected out using the McCabe and bipolar through the gland. Once all five branches of the right facial nerve were dissected out and noted to be intact anatomically and with the nerve stimulator, the superficial lobe of the right parotid gland with the inferior right parotid mass was removed. Following removal of the mass the facial nerve branches were tested with the nerve stimulator and noted to be stimulating normally and intact. The nerves were also anatomically intact.  Next, inferiorly, just anterior to the right sternocleidomastoid muscle and adjacent to the right jugular vein, I identified the enlarged right level II node seen on the CT scan. This was enlarged and firm. I dissected the node out circumferentially and removed it and sent it for pathology as a separate specimen.  A 41mm suction drain was inserted into the depth of the wound and brought out through the skin posteriorly. Subcutaneous sutures of 3-0 Vicryl were used for closure of the muscles and subcutaneous layer. The skin was closed with dermabond. Suction was applied to the drain.  Direct laryngoscopy was then performed using the anterior commisure laryngoscope after the teeth were protected with the tooth guard. He was a grade I view. The tongue base and posterior pharynx were normal. 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 This was biopsied using the small upbiting biopsy forceps. Hemostasis was obtained with topical Afrin pledgets which were removed after 2 minutes. Hemostasis was noted.  The patient was turned back to anesthesia and  awakened from anesthesia and extubated without difficulty after the  facial nerve electrodes were removed. The patient tolerated the procedure well with no immediate complications and was taken to the postoperative recovery area in good condition.   Dr. Ruby Cola was present and performed the entire procedure.   Ruby Cola 05/06/2013  4:37 PM

## 2013-05-06 NOTE — Anesthesia Procedure Notes (Signed)
Procedure Name: Intubation Date/Time: 05/06/2013 1:36 PM Performed by: Melina Copa, Alysen Smylie R Pre-anesthesia Checklist: Patient identified, Emergency Drugs available, Suction available, Patient being monitored and Timeout performed Patient Re-evaluated:Patient Re-evaluated prior to inductionOxygen Delivery Method: Circle system utilized Preoxygenation: Pre-oxygenation with 100% oxygen Intubation Type: IV induction Ventilation: Mask ventilation without difficulty Laryngoscope Size: Mac and 4 Grade View: Grade I Tube type: Oral Tube size: 7.5 mm Number of attempts: 1 Airway Equipment and Method: Stylet Placement Confirmation: ETT inserted through vocal cords under direct vision,  breath sounds checked- equal and bilateral and positive ETCO2 Secured at: 22 cm Tube secured with: Tape Dental Injury: Teeth and Oropharynx as per pre-operative assessment

## 2013-05-06 NOTE — Progress Notes (Signed)
Subjective: POD#0 from Dl with biopsy, right superficial parotidectomy, and right cervical node biopsy. Doing well, strong voice, facial nerve intact.  Objective: Vital signs in last 24 hours: Temp:  [98.2 F (36.8 C)-99 F (37.2 C)] 99 F (37.2 C) (01/28 1656) Pulse Rate:  [88] 88 (01/28 1656) Resp:  [10-18] 10 (01/28 1656) BP: (145-158)/(83-91) 145/83 mmHg (01/28 1656) SpO2:  [97 %-100 %] 100 % (01/28 1656)  Facial nerve is House Brackmann 1/6 bilaterally in all divisions, right neck incision clean dry and intact with Dermabond, right JP holding suction with ~31mL of sanguinous drainage, strong voice with no hoarseness or stridor.  @LABLAST2 (wbc:2,hgb:2,hct:2,plt:2) No results found for this basename: NA, K, CL, CO2, GLUCOSE, BUN, CREATININE, CALCIUM,  in the last 72 hours  Medications:  Scheduled Meds: . ceFAZolin      .  ceFAZolin (ANCEF) IV  1 g Intravenous Q8H  . dexamethasone      . dexamethasone  10 mg Intravenous Once   Continuous Infusions: . dextrose 5 % and 0.45 % NaCl with KCl 10 mEq/L    . lactated ringers 20 mL/hr at 05/06/13 1143   PRN Meds:.acetaminophen, acetaminophen, diphenhydrAMINE, diphenhydrAMINE, docusate sodium, HYDROcodone-acetaminophen, HYDROmorphone (DILAUDID) injection, morphine injection, ondansetron, ondansetron (ZOFRAN) IV, oxyCODONE, oxyCODONE, phenol  Assessment/Plan: Doing well s/p DL/Biopsy, right parotidectomy and node biopsy. Will monitor drain output and advance diet as tolerated.   LOS: 0 days   Ruby Cola 05/06/2013, 5:14 PM

## 2013-05-06 NOTE — Anesthesia Postprocedure Evaluation (Signed)
Anesthesia Post Note  Patient: Robert Molina  Procedure(s) Performed: Procedure(s) (LRB): PAROTIDECTOMY (Right) RADICAL NECK DISSECTION (Right)  Anesthesia type: General  Patient location: PACU  Post pain: Pain level controlled and Adequate analgesia  Post assessment: Post-op Vital signs reviewed, Patient's Cardiovascular Status Stable, Respiratory Function Stable, Patent Airway and Pain level controlled  Last Vitals:  Filed Vitals:   05/06/13 1656  BP: 145/83  Pulse: 88  Temp: 37.2 C  Resp: 10    Post vital signs: Reviewed and stable  Level of consciousness: awake, alert  and oriented  Complications: No apparent anesthesia complications

## 2013-05-06 NOTE — Transfer of Care (Signed)
Immediate Anesthesia Transfer of Care Note  Patient: Robert Molina  Procedure(s) Performed: Procedure(s) with comments: PAROTIDECTOMY (Right) - With Nerve Monitor RADICAL NECK DISSECTION (Right)  Patient Location: PACU  Anesthesia Type:General  Level of Consciousness: awake, alert , sedated and patient cooperative  Airway & Oxygen Therapy: Patient Spontanous Breathing and Patient connected to face mask oxygen  Post-op Assessment: Report given to PACU RN, Post -op Vital signs reviewed and stable and Patient moving all extremities  Post vital signs: Reviewed and stable  Complications: No apparent anesthesia complications

## 2013-05-06 NOTE — Discharge Summary (Addendum)
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), benign right cervical adenopathy (785.6)  Labs:see EPIC  Procedure(s) performed: direct laryngoscopy with biopsy, right open cervical node biopsy, right superficial parotidectomy with facial nerve dissection and preservation 05/06/13  Discharge Condition: good  Discharge Exam: right face with some mild/moderate post-op weakness with incomplete eye closure on right, had completely intact right facial nerve post-op so this is likely post-surgical nerve edema which should improve with time as nerve is completely anatomically intact, JP output decreased appropriately so removed on POD#2, right neck incision clean dry and intact with dermabond. Patient with no hoarseness or stridor, strong voice.  Discharge Instructions: Your prescriptions were called in to the [facility] outpatient pharmacy-patient may pick up after discharge. Keep incision dry x 72 hours post-op then may gently clean with soap and water. Up ad lib, no strenuous activity or heavy lifting x 1 week. Regular diet as tolerated. Follow up with Dr. Simeon Craft in one week. You should apply over the counter artificial tears and/or Lacrilube to your right eye every 4 hours and before bed, and you should gently tape your right eyelid closed while you are sleeping until your right facial weakness resolves.  Hospital Course: did well after parotidectomy/node biopsy/DL with biopsy, drain removed on POD#2 and discharged home on POD#2 in good condition.  Ruby Cola 05/08/2013 8:08 AM

## 2013-05-06 NOTE — Discharge Instructions (Addendum)
Your prescriptions were called in to the [facility] outpatient pharmacy-patient may pick up after discharge. Keep incision dry x 72 hours post-op then may gently clean with soap and water. Up ad lib, no strenuous activity or heavy lifting x 1 week. Regular diet as tolerated. Follow up with Dr. Simeon Craft in one week. You should apply over the counter artificial tears and/or Lacrilube to your right eye every 4 hours and before bed, and you should gently tape your right eyelid closed while you are sleeping until your right facial weakness resolves.

## 2013-05-07 ENCOUNTER — Encounter (HOSPITAL_COMMUNITY): Payer: Self-pay | Admitting: *Deleted

## 2013-05-07 LAB — COMPREHENSIVE METABOLIC PANEL
ALT: 7 U/L (ref 0–53)
AST: 14 U/L (ref 0–37)
Albumin: 3.2 g/dL — ABNORMAL LOW (ref 3.5–5.2)
Alkaline Phosphatase: 53 U/L (ref 39–117)
BILIRUBIN TOTAL: 0.5 mg/dL (ref 0.3–1.2)
BUN: 10 mg/dL (ref 6–23)
CHLORIDE: 101 meq/L (ref 96–112)
CO2: 26 meq/L (ref 19–32)
CREATININE: 0.81 mg/dL (ref 0.50–1.35)
Calcium: 8.8 mg/dL (ref 8.4–10.5)
GFR calc Af Amer: >90 mL/min (ref >90)
GFR calc non Af Amer: >90 mL/min (ref >90)
Glucose, Bld: 140 mg/dL — ABNORMAL HIGH (ref 70–99)
Potassium: 4.1 mEq/L (ref 3.7–5.3)
SODIUM: 139 meq/L (ref 137–147)
Total Protein: 6.5 g/dL (ref 6.0–8.3)

## 2013-05-07 LAB — CBC
HEMATOCRIT: 38.5 % — AB (ref 39.0–52.0)
Hemoglobin: 12.9 g/dL — ABNORMAL LOW (ref 13.0–17.0)
MCH: 30.2 pg (ref 26.0–34.0)
MCHC: 33.5 g/dL (ref 30.0–36.0)
MCV: 90.2 fL (ref 78.0–100.0)
Platelets: 268 10*3/uL (ref 150–400)
RBC: 4.27 MIL/uL (ref 4.22–5.81)
RDW: 14.9 % (ref 11.5–15.5)
WBC: 9.8 10*3/uL (ref 4.0–10.5)

## 2013-05-07 LAB — MAGNESIUM: Magnesium: 1.9 mg/dL (ref 1.5–2.5)

## 2013-05-07 LAB — PHOSPHORUS: Phosphorus: 3 mg/dL (ref 2.3–4.6)

## 2013-05-07 MED ORDER — INFLUENZA VAC SPLIT QUAD 0.5 ML IM SUSP
0.5000 mL | INTRAMUSCULAR | Status: AC
  Administered 2013-05-08: 0.5 mL via INTRAMUSCULAR
  Filled 2013-05-07: qty 0.5

## 2013-05-07 MED ORDER — DEXAMETHASONE SODIUM PHOSPHATE 10 MG/ML IJ SOLN
10.0000 mg | Freq: Three times a day (TID) | INTRAMUSCULAR | Status: AC
  Administered 2013-05-07 (×3): 10 mg via INTRAVENOUS
  Filled 2013-05-07 (×4): qty 1

## 2013-05-07 MED ORDER — PNEUMOCOCCAL VAC POLYVALENT 25 MCG/0.5ML IJ INJ
0.5000 mL | INJECTION | INTRAMUSCULAR | Status: AC
  Administered 2013-05-08: 0.5 mL via INTRAMUSCULAR
  Filled 2013-05-07: qty 0.5

## 2013-05-07 NOTE — Progress Notes (Signed)
Subjective: POD#1 from right superficial parotidectomy, direct laryngoscopy with right vocal fold biopsy, and right cervical lymph node biopsy. Did well overnight, has some mild right lower division facial weakness but full eye closure, drain put out 46mL overnight. Pain well controlled.  Objective: Vital signs in last 24 hours: Temp:  [97.6 F (36.4 C)-99 F (37.2 C)] 98.6 F (37 C) (01/29 0601) Pulse Rate:  [80-91] 82 (01/29 0601) Resp:  [10-18] 18 (01/29 0601) BP: (126-158)/(66-91) 150/81 mmHg (01/29 0601) SpO2:  [95 %-100 %] 97 % (01/29 0601) Weight:  [64.4 kg (141 lb 15.6 oz)] 64.4 kg (141 lb 15.6 oz) (01/28 1830)  Right facial nerve is House-Brackmann 2/6 with full right eye closure and some mild right lip and chin weakness with volitional movement, left facial nerve is House-Brackmann 1/6. The right neck JP is holding bulb suction with serosanguinous drainage and a right modified blair incision that is clean, dry, and intact with dermabond and no fluctuance or erythema. Shoulder shrug is 5/5 bilaterally and patient's voice is strong with no hoarseness or stridor.  @LABLAST2 (wbc:2,hgb:2,hct:2,plt:2)  Recent Labs  05/07/13 0620  NA 139  K 4.1  CL 101  CO2 26  GLUCOSE 140*  BUN 10  CREATININE 0.81  CALCIUM 8.8    Medications:  Scheduled Meds: .  ceFAZolin (ANCEF) IV  1 g Intravenous Q8H  . dexamethasone  10 mg Intravenous Q8H   Continuous Infusions: . dextrose 5 % and 0.45 % NaCl with KCl 10 mEq/L 75 mL/hr at 05/06/13 1950  . lactated ringers 20 mL/hr at 05/06/13 1143   PRN Meds:.acetaminophen, acetaminophen, diphenhydrAMINE, diphenhydrAMINE, docusate sodium, HYDROcodone-acetaminophen, morphine injection, ondansetron, phenol  Assessment/Plan: Will monitor the JP at least one more day since the output was 68mL overnight, and will treat with 3 doses of Decadron for the mild right lower division facial weakness. I reassured the patient that the nerve is anatomically  intact and should recover nicely once th acute swelling and post-op changes subside. We should have his pathology results when we see him back next week, and his prescriptions were all called in to [facility] outpatient pharmacy. Will monitor.   LOS: 1 day   Ruby Cola 05/07/2013, 8:10 AM

## 2013-05-07 NOTE — Progress Notes (Signed)
Utilization review completed. Kadrian Partch, RN, BSN. 

## 2013-05-08 NOTE — Plan of Care (Signed)
Problem: Discharge Progression Outcomes Goal: Tubes and drains discontinued if indicated Outcome: Completed/Met Date Met:  05/08/13 Done by MD

## 2013-05-08 NOTE — Progress Notes (Signed)
Discharge instructions reviewed with pt and instructed on where to pick up prescriptions.  Pt verbalized understanding and had no questions.  Pt discharged in stable condition via wheelchair.  Malashia Kamaka Lindsay   

## 2019-08-05 ENCOUNTER — Inpatient Hospital Stay (HOSPITAL_COMMUNITY)
Admission: EM | Admit: 2019-08-05 | Discharge: 2019-08-11 | DRG: 177 | Disposition: A | Discharge status: Skilled Nursing Facility | Payer: Medicare Other | Attending: Internal Medicine | Admitting: Internal Medicine

## 2019-08-05 ENCOUNTER — Other Ambulatory Visit: Payer: Self-pay

## 2019-08-05 ENCOUNTER — Emergency Department (HOSPITAL_COMMUNITY): Payer: Medicare Other

## 2019-08-05 ENCOUNTER — Encounter (HOSPITAL_COMMUNITY): Payer: Self-pay | Admitting: Emergency Medicine

## 2019-08-05 DIAGNOSIS — E871 Hypo-osmolality and hyponatremia: Secondary | ICD-10-CM | POA: Diagnosis present

## 2019-08-05 DIAGNOSIS — G9341 Metabolic encephalopathy: Secondary | ICD-10-CM | POA: Diagnosis present

## 2019-08-05 DIAGNOSIS — R7989 Other specified abnormal findings of blood chemistry: Secondary | ICD-10-CM | POA: Diagnosis not present

## 2019-08-05 DIAGNOSIS — J690 Pneumonitis due to inhalation of food and vomit: Secondary | ICD-10-CM | POA: Diagnosis present

## 2019-08-05 DIAGNOSIS — J9601 Acute respiratory failure with hypoxia: Secondary | ICD-10-CM | POA: Diagnosis present

## 2019-08-05 DIAGNOSIS — R4182 Altered mental status, unspecified: Secondary | ICD-10-CM | POA: Diagnosis present

## 2019-08-05 DIAGNOSIS — U071 COVID-19: Secondary | ICD-10-CM | POA: Diagnosis present

## 2019-08-05 DIAGNOSIS — X58XXXA Exposure to other specified factors, initial encounter: Secondary | ICD-10-CM | POA: Diagnosis present

## 2019-08-05 DIAGNOSIS — R339 Retention of urine, unspecified: Secondary | ICD-10-CM | POA: Diagnosis present

## 2019-08-05 DIAGNOSIS — E860 Dehydration: Secondary | ICD-10-CM | POA: Diagnosis present

## 2019-08-05 DIAGNOSIS — J1282 Pneumonia due to coronavirus disease 2019: Secondary | ICD-10-CM | POA: Diagnosis not present

## 2019-08-05 DIAGNOSIS — R066 Hiccough: Secondary | ICD-10-CM | POA: Diagnosis not present

## 2019-08-05 DIAGNOSIS — J159 Unspecified bacterial pneumonia: Secondary | ICD-10-CM | POA: Diagnosis present

## 2019-08-05 DIAGNOSIS — T17890A Other foreign object in other parts of respiratory tract causing asphyxiation, initial encounter: Secondary | ICD-10-CM | POA: Diagnosis present

## 2019-08-05 DIAGNOSIS — K209 Esophagitis, unspecified without bleeding: Secondary | ICD-10-CM | POA: Diagnosis present

## 2019-08-05 DIAGNOSIS — F1010 Alcohol abuse, uncomplicated: Secondary | ICD-10-CM | POA: Diagnosis present

## 2019-08-05 DIAGNOSIS — R0602 Shortness of breath: Secondary | ICD-10-CM

## 2019-08-05 DIAGNOSIS — F17210 Nicotine dependence, cigarettes, uncomplicated: Secondary | ICD-10-CM | POA: Diagnosis present

## 2019-08-05 LAB — COMPREHENSIVE METABOLIC PANEL
ALT: 21 U/L (ref 0–44)
AST: 36 U/L (ref 15–41)
Albumin: 3.1 g/dL — ABNORMAL LOW (ref 3.5–5.0)
Alkaline Phosphatase: 41 U/L (ref 38–126)
Anion gap: 11 (ref 5–15)
BUN: 10 mg/dL (ref 8–23)
CO2: 23 mmol/L (ref 22–32)
Calcium: 8.4 mg/dL — ABNORMAL LOW (ref 8.9–10.3)
Chloride: 89 mmol/L — ABNORMAL LOW (ref 98–111)
Creatinine, Ser: 0.92 mg/dL (ref 0.61–1.24)
GFR calc Af Amer: >60 mL/min (ref >60)
GFR calc non Af Amer: >60 mL/min (ref >60)
Glucose, Bld: 114 mg/dL — ABNORMAL HIGH (ref 70–99)
Potassium: 4.1 mmol/L (ref 3.5–5.1)
Sodium: 123 mmol/L — ABNORMAL LOW (ref 135–145)
Total Bilirubin: 1 mg/dL (ref 0.3–1.2)
Total Protein: 7 g/dL (ref 6.5–8.1)

## 2019-08-05 LAB — CBC WITH DIFFERENTIAL/PLATELET
Abs Immature Granulocytes: 0.05 10*3/uL (ref 0.00–0.07)
Basophils Absolute: 0 10*3/uL (ref 0.0–0.1)
Basophils Relative: 0 %
Eosinophils Absolute: 0 10*3/uL (ref 0.0–0.5)
Eosinophils Relative: 0 %
HCT: 39.4 % (ref 39.0–52.0)
Hemoglobin: 13.6 g/dL (ref 13.0–17.0)
Immature Granulocytes: 1 %
Lymphocytes Relative: 9 %
Lymphs Abs: 0.5 10*3/uL — ABNORMAL LOW (ref 0.7–4.0)
MCH: 30.8 pg (ref 26.0–34.0)
MCHC: 34.5 g/dL (ref 30.0–36.0)
MCV: 89.3 fL (ref 80.0–100.0)
Monocytes Absolute: 1.6 10*3/uL — ABNORMAL HIGH (ref 0.1–1.0)
Monocytes Relative: 29 %
Neutro Abs: 3.4 10*3/uL (ref 1.7–7.7)
Neutrophils Relative %: 61 %
Platelets: 213 10*3/uL (ref 150–400)
RBC: 4.41 MIL/uL (ref 4.22–5.81)
RDW: 14.6 % (ref 11.5–15.5)
WBC: 5.5 10*3/uL (ref 4.0–10.5)
nRBC: 0 % (ref 0.0–0.2)

## 2019-08-05 LAB — URINALYSIS, ROUTINE W REFLEX MICROSCOPIC
Bilirubin Urine: NEGATIVE
Glucose, UA: NEGATIVE mg/dL
Ketones, ur: 5 mg/dL — AB
Nitrite: NEGATIVE
Protein, ur: 30 mg/dL — AB
Specific Gravity, Urine: 1.024 (ref 1.005–1.030)
pH: 5 (ref 5.0–8.0)

## 2019-08-05 LAB — RESPIRATORY PANEL BY RT PCR (FLU A&B, COVID)
Influenza A by PCR: NEGATIVE
Influenza B by PCR: NEGATIVE
SARS Coronavirus 2 by RT PCR: POSITIVE — AB

## 2019-08-05 LAB — LACTIC ACID, PLASMA: Lactic Acid, Venous: 1.4 mmol/L (ref 0.5–1.9)

## 2019-08-05 LAB — POC SARS CORONAVIRUS 2 AG -  ED: SARS Coronavirus 2 Ag: NEGATIVE

## 2019-08-05 MED ORDER — DEXAMETHASONE SODIUM PHOSPHATE 10 MG/ML IJ SOLN
10.0000 mg | Freq: Once | INTRAMUSCULAR | Status: AC
  Administered 2019-08-05: 10 mg via INTRAVENOUS
  Filled 2019-08-05: qty 1

## 2019-08-05 MED ORDER — IOHEXOL 300 MG/ML  SOLN
75.0000 mL | Freq: Once | INTRAMUSCULAR | Status: AC | PRN
  Administered 2019-08-05: 75 mL via INTRAVENOUS

## 2019-08-05 MED ORDER — ALUM & MAG HYDROXIDE-SIMETH 200-200-20 MG/5ML PO SUSP
30.0000 mL | Freq: Once | ORAL | Status: AC
  Administered 2019-08-05: 30 mL via ORAL
  Filled 2019-08-05: qty 30

## 2019-08-05 MED ORDER — LIDOCAINE VISCOUS HCL 2 % MT SOLN
15.0000 mL | Freq: Once | OROMUCOSAL | Status: AC
  Administered 2019-08-05: 15 mL via ORAL
  Filled 2019-08-05: qty 15

## 2019-08-05 MED ORDER — ACETAMINOPHEN 500 MG PO TABS
1000.0000 mg | ORAL_TABLET | Freq: Once | ORAL | Status: AC
  Administered 2019-08-05: 1000 mg via ORAL
  Filled 2019-08-05: qty 2

## 2019-08-05 MED ORDER — SODIUM CHLORIDE 0.9 % IV SOLN
1.0000 g | Freq: Once | INTRAVENOUS | Status: AC
  Administered 2019-08-05: 1 g via INTRAVENOUS
  Filled 2019-08-05: qty 10

## 2019-08-05 MED ORDER — SODIUM CHLORIDE 0.9 % IV SOLN
500.0000 mg | Freq: Once | INTRAVENOUS | Status: AC
  Administered 2019-08-05: 22:00:00 500 mg via INTRAVENOUS
  Filled 2019-08-05: qty 500

## 2019-08-05 NOTE — ED Provider Notes (Signed)
[facility] EMERGENCY DEPARTMENT Provider Note   CSN: [id number] Arrival date & time: 08/05/19  1929     History Chief Complaint  Patient presents with  . Altered Mental Status    Robert Molina is a 69 y.o. male.  Pt presents to the ED today with confusion and hiccups.  Pt lives alone and neighbors have noticed that he has not been acting right.  They called EMS.  Pt does have a fever.  He has not taken anything for his fever.  He has no known covid contacts.  He has not had the vaccine.  He denies any pain.  He's had hiccups for about a week.  He's been able to swallow and to eat.        Past Medical History:  Diagnosis Date  . Pneumonia    hx of about  59yrs ago    Patient Active Problem List   Diagnosis Date Noted  . 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    Past Surgical History:  Procedure Laterality Date  . PAROTIDECTOMY Right 05/06/2013   Procedure: PAROTIDECTOMY;  Surgeon: Ruby Cola, MD;  Location: [place];  Service: ENT;  Laterality: Right;  With Nerve Monitor  . RADICAL NECK DISSECTION Right 05/06/2013   Procedure: RADICAL NECK DISSECTION;  Surgeon: Ruby Cola, MD;  Location: [place];  Service: ENT;  Laterality: Right;  . TUBAL LIGATION         History reviewed. No pertinent family history.  Social History   Tobacco Use  . Smoking status: Current Every Day Smoker    Packs/day: 1.00    Years: 44.00    Pack years: 44.00    Types: Cigarettes  . Smokeless tobacco: Never Used  Substance Use Topics  . Alcohol use: Yes    Comment: daily  1-2 beers  . Drug use: No    Home Medications Prior to Admission medications   Not on File    Allergies    Patient has no known allergies.  Review of Systems   Review of Systems  Constitutional: Positive for fever.  Gastrointestinal:       Hiccups  All other systems reviewed and are negative.   Physical Exam Updated Vital Signs BP 125/70   Pulse 87   Temp 99.6 F (37.6 C)   Resp 12   SpO2  91%   Physical Exam Vitals and nursing note reviewed.  Constitutional:      Appearance: Normal appearance.  HENT:     Head: Normocephalic and atraumatic.     Right Ear: External ear normal.     Left Ear: External ear normal.     Nose: Nose normal.     Mouth/Throat:     Mouth: Mucous membranes are dry.  Eyes:     Extraocular Movements: Extraocular movements intact.     Conjunctiva/sclera: Conjunctivae normal.     Pupils: Pupils are equal, round, and reactive to light.  Cardiovascular:     Rate and Rhythm: Normal rate and regular rhythm.     Pulses: Normal pulses.     Heart sounds: Normal heart sounds.  Pulmonary:     Effort: Pulmonary effort is normal.     Breath sounds: Normal breath sounds.  Abdominal:     General: Abdomen is flat. Bowel sounds are normal.     Palpations: Abdomen is soft.     Comments: Hiccups on exam  Musculoskeletal:        General: Normal range of motion.     Cervical  back: Normal range of motion and neck supple.  Skin:    General: Skin is warm.     Capillary Refill: Capillary refill takes less than 2 seconds.  Neurological:     General: No focal deficit present.     Mental Status: He is alert and oriented to person, place, and time.  Psychiatric:        Mood and Affect: Mood normal.        Behavior: Behavior normal.     ED Results / Procedures / Treatments   Labs (all labs ordered are listed, but only abnormal results are displayed) Labs Reviewed  RESPIRATORY PANEL BY RT PCR (FLU A&B, COVID) - Abnormal; Notable for the following components:      Result Value   SARS Coronavirus 2 by RT PCR POSITIVE (*)    All other components within normal limits  CBC WITH DIFFERENTIAL/PLATELET - Abnormal; Notable for the following components:   Lymphs Abs 0.5 (*)    Monocytes Absolute 1.6 (*)    All other components within normal limits  COMPREHENSIVE METABOLIC PANEL - Abnormal; Notable for the following components:   Sodium 123 (*)    Chloride 89 (*)     Glucose, Bld 114 (*)    Calcium 8.4 (*)    Albumin 3.1 (*)    All other components within normal limits  URINE CULTURE  CULTURE, BLOOD (ROUTINE X 2)  CULTURE, BLOOD (ROUTINE X 2)  LACTIC ACID, PLASMA  URINALYSIS, ROUTINE W REFLEX MICROSCOPIC  CBG MONITORING, ED  POC SARS CORONAVIRUS 2 AG -  ED    EKG EKG Interpretation  Date/Time:  Wednesday August 05 2019 20:14:36 EDT Ventricular Rate:  95 PR Interval:    QRS Duration: 91 QT Interval:  357 QTC Calculation: 449 R Axis:   79 Text Interpretation: nsr Anteroseptal infarct, age indeterminate Confirmed by Isla Pence [id number]) on 08/05/2019 8:25:14 PM   Radiology CT Chest W Contrast  Result Date: 08/05/2019 CLINICAL DATA:  Shortness of breath. Hiccups. Fever. EXAM: CT CHEST WITH CONTRAST TECHNIQUE: Multidetector CT imaging of the chest was performed during intravenous contrast administration. CONTRAST:  2mL OMNIPAQUE IOHEXOL 300 MG/ML  SOLN COMPARISON:  Radiograph earlier this day. FINDINGS: Cardiovascular: Aortic atherosclerosis. No dissection. No filling defects in the central most pulmonary arteries to the proximal lobar level. Heart is normal in size. There are coronary artery calcifications. No pericardial effusion. Mediastinum/Nodes: Prominent lower paratracheal node measures 9 mm. No hilar adenopathy. Patulous upper esophagus with wall thickening distally. Lower most chest including the gastroesophageal junction are obscured by motion artifact. Lungs/Pleura: Moderate apical predominant emphysema. Bronchial thickening in the mid lower lung zones with areas of mucous plugging involving the right middle and both lower lobes. Breathing motion artifact significantly limits assessment of the lung bases. There is patchy airspace disease in the right middle lobe and lower lobes. Patchy and consolidative opacity at the left lung base, with significant mucous plugging in this region. No significant pleural effusion small perifissural area of  scarring in the right upper lobe. Minimal retained mucus in the dependent trachea. There is a pleural based 7 mm left lower lobe nodule, series 4, image 137. Upper Abdomen: 9 mm low-density indeterminate lesion in the posterior right kidney. Hepatic steatosis. Motion artifact in the upper abdomen. Musculoskeletal: There are no acute or suspicious osseous abnormalities. IMPRESSION: 1. Bronchial thickening with areas of mucous plugging. Patchy consolidation in the right middle and right lower lobe in the areas of mucous plugging which may be  postobstructive atelectasis/pneumonia. 2. Dense consolidation in the left lower lobe posteriorly, with likely significant mucous plugging in this region, however motion artifact limits detailed assessment. Favor pneumonia. Recommend radiographic follow-up. 3. Patulous upper esophagus with wall thickening distally, can be seen with reflux or esophagitis. 4. Emphysema. 5. Coronary artery calcifications. 6. A 9 mm low-density lesion in the posterior right kidney is too small to accurately characterize, likely a minimally complex cyst. Aortic Atherosclerosis (ICD10-I70.0) and Emphysema (ICD10-J43.9). Electronically Signed   By: Keith Rake M.D.   On: 08/05/2019 23:17   DG Chest Portable 1 View  Result Date: 08/05/2019 CLINICAL DATA:  Fever. EXAM: PORTABLE CHEST 1 VIEW COMPARISON:  Chest radiograph 04/10/2013 FINDINGS: Monitoring leads overlie the patient. Normal cardiac and mediastinal contours. Bibasilar heterogeneous opacities, left greater than right. No pleural effusion or pneumothorax. IMPRESSION: Left-greater-than-right basilar heterogeneous opacities may represent pneumonia in the appropriate clinical setting. Followup PA and lateral chest X-ray is recommended in 3-4 weeks following trial of antibiotic therapy to ensure resolution and exclude underlying malignancy. Electronically Signed   By: Lovey Newcomer M.D.   On: 08/05/2019 20:28    Procedures Procedures  (including critical care time)  Medications Ordered in ED Medications  dexamethasone (DECADRON) injection 10 mg (has no administration in time range)  acetaminophen (TYLENOL) tablet 1,000 mg (1,000 mg Oral Given 08/05/19 2000)  alum & mag hydroxide-simeth (MAALOX/MYLANTA) 200-200-20 MG/5ML suspension 30 mL (30 mLs Oral Given 08/05/19 2119)    And  lidocaine (XYLOCAINE) 2 % viscous mouth solution 15 mL (15 mLs Oral Given 08/05/19 2119)  cefTRIAXone (ROCEPHIN) 1 g in sodium chloride 0.9 % 100 mL IVPB (0 g Intravenous Stopped 08/05/19 2150)  azithromycin (ZITHROMAX) 500 mg in sodium chloride 0.9 % 250 mL IVPB (0 mg Intravenous Stopped 08/05/19 2314)  iohexol (OMNIPAQUE) 300 MG/ML solution 75 mL (75 mLs Intravenous Contrast Given 08/05/19 2244)    ED Course  I have reviewed the triage vital signs and the nursing notes.  Pertinent labs & imaging results that were available during my care of the patient were reviewed by me and considered in my medical decision making (see chart for details).    MDM Rules/Calculators/A&P                     Hiccups are gone with gi cocktail.  Pt is confused intermittently.  He tore out his 2 IVs and climbed out of bed.  Pt's Covid POC Ag neg, but PCR is positive.   Pt does have significant pna, but he is oxygenating ok.  He is given rocephin and zithromax.  Pt d/w Dr. Hal Hope (triad) for admission.  Verdon Grimaud was evaluated in Emergency Department on 08/05/2019 for the symptoms described in the history of present illness. He was evaluated in the context of the global COVID-19 pandemic, which necessitated consideration that the patient might be at risk for infection with the SARS-CoV-2 virus that causes COVID-19. Institutional protocols and algorithms that pertain to the evaluation of patients at risk for COVID-19 are in a state of rapid change based on information released by regulatory bodies including the CDC and federal and state organizations. These  policies and algorithms were followed during the patient's care in the ED.  Final Clinical Impression(s) / ED Diagnoses Final diagnoses:  Metabolic encephalopathy  Hyponatremia  Hiccups  Pneumonia due to COVID-19 virus  Esophagitis    Rx / DC Orders ED Discharge Orders    None       Isla Pence,  MD 08/05/19 2327

## 2019-08-05 NOTE — ED Triage Notes (Signed)
Pt BIB [facility] from home. Pt has been confused for x7 days with hiccups for same. Pt has fever 102 other vitals WNL. 18 L forearm. 567mL NS given in route

## 2019-08-06 ENCOUNTER — Encounter (HOSPITAL_COMMUNITY): Payer: Self-pay | Admitting: Internal Medicine

## 2019-08-06 ENCOUNTER — Inpatient Hospital Stay (HOSPITAL_COMMUNITY): Payer: Medicare Other

## 2019-08-06 DIAGNOSIS — E871 Hypo-osmolality and hyponatremia: Secondary | ICD-10-CM

## 2019-08-06 DIAGNOSIS — R066 Hiccough: Secondary | ICD-10-CM | POA: Insufficient documentation

## 2019-08-06 DIAGNOSIS — J1282 Pneumonia due to coronavirus disease 2019: Secondary | ICD-10-CM

## 2019-08-06 DIAGNOSIS — U071 COVID-19: Principal | ICD-10-CM

## 2019-08-06 DIAGNOSIS — G9341 Metabolic encephalopathy: Secondary | ICD-10-CM | POA: Insufficient documentation

## 2019-08-06 DIAGNOSIS — K209 Esophagitis, unspecified without bleeding: Secondary | ICD-10-CM

## 2019-08-06 LAB — BLOOD CULTURE ID PANEL (REFLEXED)

## 2019-08-06 LAB — ABO/RH: ABO/RH(D): O POS

## 2019-08-06 LAB — BASIC METABOLIC PANEL
Anion gap: 11 (ref 5–15)
Anion gap: 12 (ref 5–15)
BUN: 9 mg/dL (ref 8–23)
BUN: 12 mg/dL (ref 8–23)
CO2: 24 mmol/L (ref 22–32)
CO2: 25 mmol/L (ref 22–32)
Calcium: 8.5 mg/dL — ABNORMAL LOW (ref 8.9–10.3)
Calcium: 8.5 mg/dL — ABNORMAL LOW (ref 8.9–10.3)
Chloride: 93 mmol/L — ABNORMAL LOW (ref 98–111)
Chloride: 93 mmol/L — ABNORMAL LOW (ref 98–111)
Creatinine, Ser: 0.85 mg/dL (ref 0.61–1.24)
Creatinine, Ser: 0.87 mg/dL (ref 0.61–1.24)
GFR calc Af Amer: >60 mL/min (ref >60)
GFR calc Af Amer: >60 mL/min (ref >60)
GFR calc non Af Amer: >60 mL/min (ref >60)
GFR calc non Af Amer: >60 mL/min (ref >60)
Glucose, Bld: 153 mg/dL — ABNORMAL HIGH (ref 70–99)
Glucose, Bld: 147 mg/dL — ABNORMAL HIGH (ref 70–99)
Potassium: 4.3 mmol/L (ref 3.5–5.1)
Potassium: 4.4 mmol/L (ref 3.5–5.1)
Sodium: 128 mmol/L — ABNORMAL LOW (ref 135–145)
Sodium: 130 mmol/L — ABNORMAL LOW (ref 135–145)

## 2019-08-06 LAB — RETICULOCYTES
Immature Retic Fract: 8.1 % (ref 2.3–15.9)
RBC.: 4.69 MIL/uL (ref 4.22–5.81)
Retic Count, Absolute: 33.8 10*3/uL (ref 19.0–186.0)
Retic Ct Pct: 0.7 % (ref 0.4–3.1)

## 2019-08-06 LAB — CREATININE, SERUM
Creatinine, Ser: 0.91 mg/dL (ref 0.61–1.24)
GFR calc Af Amer: >60 mL/min (ref >60)
GFR calc non Af Amer: >60 mL/min (ref >60)

## 2019-08-06 LAB — CBC
HCT: 38.9 % — ABNORMAL LOW (ref 39.0–52.0)
HCT: 41.6 % (ref 39.0–52.0)
Hemoglobin: 13.2 g/dL (ref 13.0–17.0)
Hemoglobin: 14.2 g/dL (ref 13.0–17.0)
MCH: 30.3 pg (ref 26.0–34.0)
MCH: 30.2 pg (ref 26.0–34.0)
MCHC: 33.9 g/dL (ref 30.0–36.0)
MCHC: 34.1 g/dL (ref 30.0–36.0)
MCV: 89.2 fL (ref 80.0–100.0)
MCV: 88.5 fL (ref 80.0–100.0)
Platelets: 204 10*3/uL (ref 150–400)
Platelets: 219 10*3/uL (ref 150–400)
RBC: 4.36 MIL/uL (ref 4.22–5.81)
RBC: 4.7 MIL/uL (ref 4.22–5.81)
RDW: 14.7 % (ref 11.5–15.5)
RDW: 14.6 % (ref 11.5–15.5)
WBC: 5 10*3/uL (ref 4.0–10.5)
WBC: 4.9 10*3/uL (ref 4.0–10.5)
nRBC: 0 % (ref 0.0–0.2)
nRBC: 0 % (ref 0.0–0.2)

## 2019-08-06 LAB — HEPATIC FUNCTION PANEL
ALT: 20 U/L (ref 0–44)
AST: 30 U/L (ref 15–41)
Albumin: 2.7 g/dL — ABNORMAL LOW (ref 3.5–5.0)
Alkaline Phosphatase: 37 U/L — ABNORMAL LOW (ref 38–126)
Bilirubin, Direct: 0.1 mg/dL (ref 0.0–0.2)
Indirect Bilirubin: 0.4 mg/dL (ref 0.3–0.9)
Total Bilirubin: 0.5 mg/dL (ref 0.3–1.2)
Total Protein: 6.5 g/dL (ref 6.5–8.1)

## 2019-08-06 LAB — SODIUM, URINE, RANDOM: Sodium, Ur: 12 mmol/L

## 2019-08-06 LAB — MAGNESIUM
Magnesium: 1.9 mg/dL (ref 1.7–2.4)
Magnesium: 1.9 mg/dL (ref 1.7–2.4)

## 2019-08-06 LAB — HIV ANTIBODY (ROUTINE TESTING W REFLEX): HIV Screen 4th Generation wRfx: NONREACTIVE

## 2019-08-06 LAB — IRON AND TIBC
Iron: 16 ug/dL — ABNORMAL LOW (ref 45–182)
Saturation Ratios: 6 % — ABNORMAL LOW (ref 17.9–39.5)
TIBC: 266 ug/dL (ref 250–450)
UIBC: 250 ug/dL

## 2019-08-06 LAB — PROCALCITONIN: Procalcitonin: 1.14 ng/mL

## 2019-08-06 LAB — BRAIN NATRIURETIC PEPTIDE: B Natriuretic Peptide: 134.8 pg/mL — ABNORMAL HIGH (ref 0.0–100.0)

## 2019-08-06 LAB — FOLATE: Folate: 18.3 ng/mL (ref >5.9)

## 2019-08-06 LAB — OSMOLALITY, URINE: Osmolality, Ur: 272 mOsm/kg — ABNORMAL LOW (ref 300–900)

## 2019-08-06 LAB — CORTISOL: Cortisol, Plasma: 12.3 ug/dL

## 2019-08-06 LAB — AMMONIA: Ammonia: 25 umol/L (ref 9–35)

## 2019-08-06 LAB — C-REACTIVE PROTEIN
CRP: 11.1 mg/dL — ABNORMAL HIGH (ref <1.0)
CRP: 12.2 mg/dL — ABNORMAL HIGH (ref <1.0)

## 2019-08-06 LAB — D-DIMER, QUANTITATIVE
D-Dimer, Quant: 2.89 ug/mL-FEU — ABNORMAL HIGH (ref 0.00–0.50)
D-Dimer, Quant: 2.88 ug/mL-FEU — ABNORMAL HIGH (ref 0.00–0.50)

## 2019-08-06 LAB — TROPONIN I (HIGH SENSITIVITY)
Troponin I (High Sensitivity): 11 ng/L (ref <18)
Troponin I (High Sensitivity): 11 ng/L (ref <18)

## 2019-08-06 LAB — TSH: TSH: 0.168 u[IU]/mL — ABNORMAL LOW (ref 0.350–4.500)

## 2019-08-06 LAB — FERRITIN: Ferritin: 45 ng/mL (ref 24–336)

## 2019-08-06 LAB — OSMOLALITY: Osmolality: 270 mOsm/kg — ABNORMAL LOW (ref 275–295)

## 2019-08-06 LAB — VITAMIN B12: Vitamin B-12: 1108 pg/mL — ABNORMAL HIGH (ref 180–914)

## 2019-08-06 MED ORDER — SODIUM CHLORIDE 0.9 % IV SOLN
500.0000 mg | INTRAVENOUS | Status: DC
  Filled 2019-08-06: qty 500

## 2019-08-06 MED ORDER — THIAMINE HCL 100 MG/ML IJ SOLN: 100.0000 mg | Freq: Every day | INTRAMUSCULAR | Status: DC

## 2019-08-06 MED ORDER — THIAMINE HCL 100 MG PO TABS
100.0000 mg | ORAL_TABLET | Freq: Every day | ORAL | Status: DC
  Administered 2019-08-06 – 2019-08-11 (×6): 100 mg via ORAL
  Filled 2019-08-06 (×6): qty 1

## 2019-08-06 MED ORDER — LORAZEPAM 1 MG PO TABS: 1.0000 mg | ORAL_TABLET | ORAL | Status: AC | PRN

## 2019-08-06 MED ORDER — SODIUM CHLORIDE 0.9 % IV SOLN
2.0000 g | INTRAVENOUS | Status: AC
  Administered 2019-08-06 – 2019-08-10 (×5): 2 g via INTRAVENOUS
  Filled 2019-08-06 (×5): qty 20

## 2019-08-06 MED ORDER — ONDANSETRON HCL 4 MG/2ML IJ SOLN: 4.0000 mg | Freq: Four times a day (QID) | INTRAMUSCULAR | Status: DC | PRN

## 2019-08-06 MED ORDER — ADULT MULTIVITAMIN W/MINERALS CH
1.0000 | ORAL_TABLET | Freq: Every day | ORAL | Status: DC
  Administered 2019-08-06 – 2019-08-11 (×6): 1 via ORAL
  Filled 2019-08-06 (×6): qty 1

## 2019-08-06 MED ORDER — PANTOPRAZOLE SODIUM 40 MG PO TBEC
40.0000 mg | DELAYED_RELEASE_TABLET | Freq: Every day | ORAL | Status: DC
  Administered 2019-08-06 – 2019-08-11 (×6): 40 mg via ORAL
  Filled 2019-08-06 (×5): qty 1

## 2019-08-06 MED ORDER — SODIUM CHLORIDE 0.9 % IV SOLN
100.0000 mg | Freq: Every day | INTRAVENOUS | Status: AC
  Administered 2019-08-07 – 2019-08-10 (×4): 100 mg via INTRAVENOUS
  Filled 2019-08-06 (×4): qty 20

## 2019-08-06 MED ORDER — DEXAMETHASONE SODIUM PHOSPHATE 10 MG/ML IJ SOLN
6.0000 mg | INTRAMUSCULAR | Status: DC
  Administered 2019-08-06 – 2019-08-07 (×2): 6 mg via INTRAVENOUS
  Filled 2019-08-06 (×3): qty 1

## 2019-08-06 MED ORDER — SODIUM CHLORIDE 0.9 % IV SOLN
25.0000 mg | Freq: Three times a day (TID) | INTRAVENOUS | Status: DC | PRN
  Filled 2019-08-06: qty 1

## 2019-08-06 MED ORDER — SODIUM CHLORIDE 0.9 % IV SOLN
200.0000 mg | Freq: Once | INTRAVENOUS | Status: AC
  Administered 2019-08-06: 200 mg via INTRAVENOUS
  Filled 2019-08-06: qty 40

## 2019-08-06 MED ORDER — SODIUM CHLORIDE 0.9 % IV SOLN
500.0000 mg | INTRAVENOUS | Status: AC
  Administered 2019-08-06 – 2019-08-10 (×5): 500 mg via INTRAVENOUS
  Filled 2019-08-06 (×6): qty 500

## 2019-08-06 MED ORDER — FOLIC ACID 1 MG PO TABS
1.0000 mg | ORAL_TABLET | Freq: Every day | ORAL | Status: DC
  Administered 2019-08-06 – 2019-08-11 (×6): 1 mg via ORAL
  Filled 2019-08-06 (×6): qty 1

## 2019-08-06 MED ORDER — SODIUM CHLORIDE 0.9 % IV SOLN: INTRAVENOUS | Status: AC

## 2019-08-06 MED ORDER — TAMSULOSIN HCL 0.4 MG PO CAPS
0.4000 mg | ORAL_CAPSULE | Freq: Every day | ORAL | Status: DC
  Administered 2019-08-06 – 2019-08-11 (×6): 0.4 mg via ORAL
  Filled 2019-08-06 (×6): qty 1

## 2019-08-06 MED ORDER — ENOXAPARIN SODIUM 40 MG/0.4ML ~~LOC~~ SOLN
40.0000 mg | Freq: Every day | SUBCUTANEOUS | Status: DC
  Administered 2019-08-06 – 2019-08-09 (×4): 40 mg via SUBCUTANEOUS
  Filled 2019-08-06 (×4): qty 0.4

## 2019-08-06 MED ORDER — ONDANSETRON HCL 4 MG PO TABS: 4.0000 mg | ORAL_TABLET | Freq: Four times a day (QID) | ORAL | Status: DC | PRN

## 2019-08-06 MED ORDER — LORAZEPAM 2 MG/ML IJ SOLN
1.0000 mg | INTRAMUSCULAR | Status: AC | PRN
  Administered 2019-08-06 – 2019-08-07 (×2): 2 mg via INTRAVENOUS
  Filled 2019-08-06 (×2): qty 1

## 2019-08-06 NOTE — Progress Notes (Addendum)
PROGRESS NOTE                                                                                                                                                                                                             Patient Demographics:    Robert Molina, is a 69 y.o. male, DOB - 1951-02-15, PJK:[id number]  Outpatient Primary MD for the patient is Patient, No Pcp Per    LOS - 1  Admit date - 08/05/2019    Chief Complaint  Patient presents with  . Altered Mental Status       Brief Narrative - Robert Molina is a 69 y.o. male with no significant past medical history with chronic tobacco and alcohol abuse was found to be increasingly confused by patient's neighbors and EMS was alerted and was brought to the ER.  Patient states he has been having hiccups for the last few days denies any chest pain shortness of breath headache nausea vomiting or diarrhea.  Denies any productive cough.  Admits to drinking alcohol about 2 beers every day, in the ER he was diagnosed with COVID-19 pneumonia along with signs of bacterial pneumonia versus chronic aspiration.  Admitted to the hospital.   Subjective:    Robert Molina today has, No headache, No chest pain, No abdominal pain - No Nausea, No new weakness tingling or numbness, mild cough but denies any shortness of breath.   Assessment  & Plan :     1. Acute Hypoxic Resp. Failure due to Acute Covid 19 Viral Pneumonitis during the ongoing 2020 Covid 19 Pandemic - he has mild to moderate disease, suspicion for having both COVID-19 pneumonia along with possible aspiration pneumonia.  New combination of steroids, remdesivir and IV antibiotics.  Will have speech evaluate and continue aspiration precautions, flutter valve and I-S added for pulmonary toiletry.  Encouraged the patient to sit up in chair in the daytime use I-S and flutter valve for pulmonary toiletry and then prone in bed when  at night.  Will advance activity and titrate down oxygen as possible.    SpO2: 91 % O2 Flow Rate (L/min): 0 L/min FiO2 (%): 21 %  Recent Labs  Lab 08/05/19 2118 08/06/19 0242 08/06/19 0735  CRP  --  11.1* 12.2*  DDIMER  --  2.89* 2.88*  BNP  --   --  134.8*  PROCALCITON  --  1.14  --   SARSCOV2NAA POSITIVE*  --   --     Hepatic Function Latest Ref Rng & Units 08/06/2019 08/05/2019 05/07/2013  Total Protein 6.5 - 8.1 g/dL 6.5 7.0 6.5  Albumin 3.5 - 5.0 g/dL 2.7(L) 3.1(L) 3.2(L)  AST 15 - 41 U/L 30 36 14  ALT 0 - 44 U/L 20 21 7   Alk Phosphatase 38 - 126 U/L 37(L) 41 53  Total Bilirubin 0.3 - 1.2 mg/dL 0.5 1.0 0.5  Bilirubin, Direct 0.0 - 0.2 mg/dL 0.1 - -    2.  Ongoing hiccups.  Thorazine added.  If they continue CT abdomen pelvis will be done to rule out any issues with his diaphragm.  3.  Alcohol intake.  Denies any abuse.  Monitor on CIWA protocol.  On folic acid and thiamine.  4.  Generalized weakness.  PT OT consult and monitor.  5.  Dehydration with hyponatremia.  Check urine electrolytes, serum and urine osmolality, hydrate with normal saline and monitor.  6.  Possible esophagitis.  PPI with outpatient GI follow-up.    Condition - Fair  Family Communication  Annia Friendly [phone] on 08/06/2019 at 11 AM.  No voicemail set.  Code Status :  Full  Diet :   Diet Order            Diet regular Room service appropriate? Yes; Fluid consistency: Thin  Diet effective now               Disposition Plan  :    Status is: Inpatient  Remains inpatient appropriate because:IV treatments appropriate due to intensity of illness or inability to take PO   Dispo: The patient is from: Home              Anticipated d/c is to: Home              Anticipated d/c date is: > 3 days              Patient currently is not medically stable to d/c.  Consults  :  None  Procedures  :    CT - 1. Bronchial thickening with areas of mucous plugging. Patchy consolidation in the  right middle and right lower lobe in the areas of mucous plugging which may be postobstructive atelectasis/pneumonia. 2. Dense consolidation in the left lower lobe posteriorly, with likely significant mucous plugging in this region, however motion artifact limits detailed assessment. Favor pneumonia. Recommend radiographic follow-up. 3. Patulous upper esophagus with wall thickening distally, can be seen with reflux or esophagitis. 4. Emphysema. 5. Coronary artery calcifications. 6. A 9 mm low-density lesion in the posterior right kidney is too small to accurately characterize, likely a minimally complex cyst. Aortic Atherosclerosis (ICD10-I70.0) and Emphysema  PUD Prophylaxis : PPI  DVT Prophylaxis  :  Lovenox   Lab Results  Component Value Date   PLT 219 08/06/2019    Inpatient Medications  Scheduled Meds: . dexamethasone (DECADRON) injection  6 mg Intravenous Q24H  . enoxaparin (LOVENOX) injection  40 mg Subcutaneous Daily  . folic acid  1 mg Oral Daily  . multivitamin with minerals  1 tablet Oral Daily  . tamsulosin  0.4 mg Oral Daily  . thiamine  100 mg Oral Daily   Or  . thiamine  100 mg Intravenous Daily   Continuous Infusions: . sodium chloride    . azithromycin    . cefTRIAXone (ROCEPHIN)  IV    . [  START ON 08/07/2019] remdesivir 100 mg in NS 100 mL     PRN Meds:.LORazepam **OR** LORazepam, [DISCONTINUED] ondansetron **OR** ondansetron (ZOFRAN) IV  Antibiotics  :    Anti-infectives (From admission, onward)   Start     Dose/Rate Route Frequency Ordered Stop   08/07/19 1000  remdesivir 100 mg in sodium chloride 0.9 % 100 mL IVPB     100 mg 200 mL/hr over 30 Minutes Intravenous Daily 08/06/19 0009 08/11/19 0959   08/06/19 2200  azithromycin (ZITHROMAX) 500 mg in sodium chloride 0.9 % 250 mL IVPB  Status:  Discontinued     500 mg 250 mL/hr over 60 Minutes Intravenous Every 24 hours 08/06/19 0636 08/06/19 0734   08/06/19 2100  cefTRIAXone (ROCEPHIN) 2 g in sodium chloride  0.9 % 100 mL IVPB     2 g 200 mL/hr over 30 Minutes Intravenous Every 24 hours 08/06/19 0636 08/11/19 2059   08/06/19 0800  azithromycin (ZITHROMAX) 500 mg in sodium chloride 0.9 % 250 mL IVPB     500 mg 250 mL/hr over 60 Minutes Intravenous Every 24 hours 08/06/19 0737     08/06/19 0100  remdesivir 200 mg in sodium chloride 0.9% 250 mL IVPB     200 mg 580 mL/hr over 30 Minutes Intravenous Once 08/06/19 0009 08/06/19 0241   08/05/19 2100  cefTRIAXone (ROCEPHIN) 1 g in sodium chloride 0.9 % 100 mL IVPB     1 g 200 mL/hr over 30 Minutes Intravenous  Once 08/05/19 2034 08/05/19 2150   08/05/19 2100  azithromycin (ZITHROMAX) 500 mg in sodium chloride 0.9 % 250 mL IVPB     500 mg 250 mL/hr over 60 Minutes Intravenous  Once 08/05/19 2034 08/05/19 2314       Time Spent in minutes  30   Lala Lund M.D on 08/06/2019 at 10:59 AM  To page go to [email or web address] - password [facility]  [facility] -  Office  [phone]     See all Orders from today for further details    Objective:   Vitals:   08/06/19 0004 08/06/19 0100 08/06/19 0245 08/06/19 0400  BP:  115/70 130/74 131/73  Pulse:  81 75 73  Resp:   20 20  Temp:    99.5 F (37.5 C)  TempSrc:    Oral  SpO2: 92% 92% (!) 88% 91%  Weight:    61.6 kg  Height:    5' 11"  (1.803 m)    Wt Readings from Last 3 Encounters:  08/06/19 61.6 kg  05/06/13 64.4 kg  04/30/13 64.4 kg     Intake/Output Summary (Last 24 hours) at 08/06/2019 1059 Last data filed at 08/06/2019 0445 Gross per 24 hour  Intake 353.33 ml  Output 150 ml  Net 203.33 ml     Physical Exam  Awake Alert, No new F.N deficits, Normal affect [place].AT,PERRAL Supple Neck,No JVD, No cervical lymphadenopathy appriciated.  Symmetrical Chest wall movement, Good air movement bilaterally, CTAB RRR,No Gallops,Rubs or new Murmurs, No Parasternal Heave +ve B.Sounds, Abd Soft, No tenderness, No organomegaly appriciated, No rebound - guarding or rigidity. No Cyanosis,  Clubbing or edema, No new Rash or bruise       Data Review:    CBC Recent Labs  Lab 08/05/19 2020 08/06/19 0242 08/06/19 0735  WBC 5.5 5.0 4.9  HGB 13.6 13.2 14.2  HCT 39.4 38.9* 41.6  PLT 213 204 219  MCV 89.3 89.2 88.5  MCH 30.8 30.3 30.2  MCHC 34.5 33.9 34.1  RDW  14.6 14.7 14.6  LYMPHSABS 0.5*  --   --   MONOABS 1.6*  --   --   EOSABS 0.0  --   --   BASOSABS 0.0  --   --     Chemistries  Recent Labs  Lab 08/05/19 2020 08/06/19 0242 08/06/19 0704 08/06/19 0735  NA 123*  --  128*  --   K 4.1  --  4.3  --   CL 89*  --  93*  --   CO2 23  --  24  --   GLUCOSE 114*  --  153*  --   BUN 10  --  9  --   CREATININE 0.92 0.91 0.85  --   CALCIUM 8.4*  --  8.5*  --   AST 36  --  30  --   ALT 21  --  20  --   ALKPHOS 41  --  37*  --   BILITOT 1.0  --  0.5  --   MG  --   --  1.9 1.9  AMMONIA  --   --  25  --   TSH  --   --  0.168*  --      ------------------------------------------------------------------------------------------------------------------ No results for input(s): CHOL, HDL, LDLCALC, TRIG, CHOLHDL, LDLDIRECT in the last 72 hours.  No results found for: HGBA1C ------------------------------------------------------------------------------------------------------------------ Recent Labs    08/06/19 0704  TSH 0.168*    Cardiac Enzymes No results for input(s): CKMB, TROPONINI, MYOGLOBIN in the last 168 hours.  Invalid input(s): CK ------------------------------------------------------------------------------------------------------------------    Component Value Date/Time   BNP 134.8 (H) 08/06/2019 0735    Micro Results Recent Results (from the past 240 hour(s))  Respiratory Panel by RT PCR (Flu A&B, Covid) - Nasopharyngeal Swab     Status: Abnormal   Collection Time: 08/05/19  9:18 PM   Specimen: Nasopharyngeal Swab  Result Value Ref Range Status   SARS Coronavirus 2 by RT PCR POSITIVE (A) NEGATIVE Final    Comment: RESULT CALLED TO, READ  BACK BY AND VERIFIED WITH: O RAGNER RN 08/05/19 2319 JDW (NOTE) SARS-CoV-2 target nucleic acids are DETECTED. SARS-CoV-2 RNA is generally detectable in upper respiratory specimens  during the acute phase of infection. Positive results are indicative of the presence of the identified virus, but do not rule out bacterial infection or co-infection with other pathogens not detected by the test. Clinical correlation with patient history and other diagnostic information is necessary to determine patient infection status. The expected result is Negative. Fact Sheet for Patients:  PinkCheek.be Fact Sheet for Healthcare Providers: GravelBags.it This test is not yet approved or cleared by the [place] FDA and  has been authorized for detection and/or diagnosis of SARS-CoV-2 by FDA under an Emergency Use Authorization (EUA).  This EUA will remain in effect (meaning this test can be used) for the  duration of  the COVID-19 declaration under Section 564(b)(1) of the Act, 21 U.S.C. section 360bbb-3(b)(1), unless the authorization is terminated or revoked sooner.    Influenza A by PCR NEGATIVE NEGATIVE Final   Influenza B by PCR NEGATIVE NEGATIVE Final    Comment: (NOTE) The Xpert Xpress SARS-CoV-2/FLU/RSV assay is intended as an aid in  the diagnosis of influenza from Nasopharyngeal swab specimens and  should not be used as a sole basis for treatment. Nasal washings and  aspirates are unacceptable for Xpert Xpress SARS-CoV-2/FLU/RSV  testing. Fact Sheet for Patients: PinkCheek.be Fact Sheet for Healthcare Providers: GravelBags.it This test is  not yet approved or cleared by the [place] and  has been authorized for detection and/or diagnosis of SARS-CoV-2 by  FDA under an Emergency Use Authorization (EUA). This EUA will remain  in effect (meaning this test can be used)  for the duration of the  Covid-19 declaration under Section 564(b)(1) of the Act, 21  U.S.C. section 360bbb-3(b)(1), unless the authorization is  terminated or revoked. Performed at [facility], [place] [address], [place], [place] [address]   Culture, blood (routine x 2)     Status: None (Preliminary result)   Collection Time: 08/05/19  9:32 PM   Specimen: BLOOD RIGHT FOREARM  Result Value Ref Range Status   Specimen Description BLOOD RIGHT FOREARM  Final   Special Requests   Final    BOTTLES DRAWN AEROBIC AND ANAEROBIC Blood Culture adequate volume   Culture   Final    NO GROWTH < 12 HOURS Performed at [facility], [place] [address] [place], [place] [address]    Report Status PENDING  Incomplete  Culture, blood (routine x 2)     Status: None (Preliminary result)   Collection Time: 08/05/19  9:33 PM   Specimen: BLOOD LEFT FOREARM  Result Value Ref Range Status   Specimen Description BLOOD LEFT FOREARM  Final   Special Requests   Final    BOTTLES DRAWN AEROBIC AND ANAEROBIC Blood Culture adequate volume   Culture   Final    NO GROWTH < 12 HOURS Performed at [facility], [place] [address] [place], [place] [address]    Report Status PENDING  Incomplete    Radiology Reports CT Chest W Contrast  Result Date: 08/05/2019 CLINICAL DATA:  Shortness of breath. Hiccups. Fever. EXAM: CT CHEST WITH CONTRAST TECHNIQUE: Multidetector CT imaging of the chest was performed during intravenous contrast administration. CONTRAST:  2m OMNIPAQUE IOHEXOL 300 MG/ML  SOLN COMPARISON:  Radiograph earlier this day. FINDINGS: Cardiovascular: Aortic atherosclerosis. No dissection. No filling defects in the central most pulmonary arteries to the proximal lobar level. Heart is normal in size. There are coronary artery calcifications. No pericardial effusion. Mediastinum/Nodes: Prominent lower paratracheal node measures 9 mm. No hilar adenopathy. Patulous upper esophagus with wall thickening  distally. Lower most chest including the gastroesophageal junction are obscured by motion artifact. Lungs/Pleura: Moderate apical predominant emphysema. Bronchial thickening in the mid lower lung zones with areas of mucous plugging involving the right middle and both lower lobes. Breathing motion artifact significantly limits assessment of the lung bases. There is patchy airspace disease in the right middle lobe and lower lobes. Patchy and consolidative opacity at the left lung base, with significant mucous plugging in this region. No significant pleural effusion small perifissural area of scarring in the right upper lobe. Minimal retained mucus in the dependent trachea. There is a pleural based 7 mm left lower lobe nodule, series 4, image 137. Upper Abdomen: 9 mm low-density indeterminate lesion in the posterior right kidney. Hepatic steatosis. Motion artifact in the upper abdomen. Musculoskeletal: There are no acute or suspicious osseous abnormalities. IMPRESSION: 1. Bronchial thickening with areas of mucous plugging. Patchy consolidation in the right middle and right lower lobe in the areas of mucous plugging which may be postobstructive atelectasis/pneumonia. 2. Dense consolidation in the left lower lobe posteriorly, with likely significant mucous plugging in this region, however motion artifact limits detailed assessment. Favor pneumonia. Recommend radiographic follow-up. 3. Patulous upper esophagus with wall thickening distally, can be seen with reflux or esophagitis.  4. Emphysema. 5. Coronary artery calcifications. 6. A 9 mm low-density lesion in the posterior right kidney is too small to accurately characterize, likely a minimally complex cyst. Aortic Atherosclerosis (ICD10-I70.0) and Emphysema (ICD10-J43.9). Electronically Signed   By: Keith Rake M.D.   On: 08/05/2019 23:17   DG Chest Port 1 View  Result Date: 08/06/2019 CLINICAL DATA:  Shortness of breath. EXAM: PORTABLE CHEST 1 VIEW COMPARISON:   08/05/2019 and CT 08/05/2018 FINDINGS: Lungs are adequately inflated demonstrate stable hazy opacification over the left base/retrocardiac region and medial right base. No definite effusion. Cardiomediastinal silhouette and remainder the exam is unchanged. IMPRESSION: Persistent mild bibasilar opacification most prominent in the left base/retrocardiac region compatible recent CT findings suggesting pneumonia. Electronically Signed   By: Marin Olp M.D.   On: 08/06/2019 08:20   DG Chest Portable 1 View  Result Date: 08/05/2019 CLINICAL DATA:  Fever. EXAM: PORTABLE CHEST 1 VIEW COMPARISON:  Chest radiograph 04/10/2013 FINDINGS: Monitoring leads overlie the patient. Normal cardiac and mediastinal contours. Bibasilar heterogeneous opacities, left greater than right. No pleural effusion or pneumothorax. IMPRESSION: Left-greater-than-right basilar heterogeneous opacities may represent pneumonia in the appropriate clinical setting. Followup PA and lateral chest X-ray is recommended in 3-4 weeks following trial of antibiotic therapy to ensure resolution and exclude underlying malignancy. Electronically Signed   By: Lovey Newcomer M.D.   On: 08/05/2019 20:28

## 2019-08-06 NOTE — H&P (Signed)
History and Physical    Robert Molina [id number] DOB: 07-29-1950 DOA: 08/05/2019  PCP: Patient, No Pcp Per  Patient coming from: Home.  Chief Complaint: Confusion.  HPI: Robert Molina is a 69 y.o. male with no significant past medical history with chronic tobacco and alcohol abuse was found to be increasingly confused by patient's neighbors and EMS was alerted and was brought to the ER.  Patient states he has been having hiccups for the last few days denies any chest pain shortness of breath headache nausea vomiting or diarrhea.  Denies any productive cough.  Admits to drinking alcohol about 2 beers every day.  ED Course: In the ER patient was confused initially with fever of 102.7 F tachycardic and chest x-ray showing features concerning for pneumonia.  Patient's confusion improved after fevers improved with Tylenol.  CT chest with contrast shows bilateral consolidation with mucous plugging and postobstructive pneumonia features.  Patient was started on empiric antibiotics initially for community-acquired pneumonia.  Later when patient's Covid test came back positive was started on Decadron by ER physician along with remdesivir.  Lactic acid was normal.  On exam patient is alert awake oriented to his name and place moving all extremities.  EKG shows normal sinus rhythm with nonspecific ST changes in anteroseptal leads.  UA was concerning for UTI.  Review of Systems: As per HPI, rest all negative.   Past Medical History:  Diagnosis Date  . Pneumonia    hx of about  72yrs ago    Past Surgical History:  Procedure Laterality Date  . PAROTIDECTOMY Right 05/06/2013   Procedure: PAROTIDECTOMY;  Surgeon: Ruby Cola, MD;  Location: [facility];  Service: ENT;  Laterality: Right;  With Nerve Monitor  . RADICAL NECK DISSECTION Right 05/06/2013   Procedure: RADICAL NECK DISSECTION;  Surgeon: Ruby Cola, MD;  Location: [facility];  Service: ENT;  Laterality: Right;     reports that he has been  smoking cigarettes. He has a 44.00 pack-year smoking history. He has never used smokeless tobacco. He reports current alcohol use. He reports that he does not use drugs.  No Known Allergies  Family History  Problem Relation Age of Onset  . Diabetes type II Neg Hx     Prior to Admission medications   Not on File    Physical Exam: Constitutional: Moderately built and nourished. Vitals:   08/05/19 2111 08/05/19 2113 08/05/19 2200 08/05/19 2300  BP: 125/70  125/70 126/78  Pulse:  95 87 86  Resp:      Temp:      TempSrc:      SpO2:  91% 91% 94%   Eyes: Anicteric no pallor. ENMT: No discharge from the ears eyes nose or mouth. Neck: No mass felt.  No neck rigidity. Respiratory: No rhonchi or crepitations. Cardiovascular: S1-S2 heard. Abdomen: Soft nontender bowel sounds present. Musculoskeletal: No edema. Skin: No rash. Neurologic: Alert awake oriented to place and person moves all extremities. Psychiatric: Oriented to place and person.   Labs on Admission: I have personally reviewed following labs and imaging studies  CBC: Recent Labs  Lab 08/05/19 2020  WBC 5.5  NEUTROABS 3.4  HGB 13.6  HCT 39.4  MCV 89.3  PLT 123456   Basic Metabolic Panel: Recent Labs  Lab 08/05/19 2020  NA 123*  K 4.1  CL 89*  CO2 23  GLUCOSE 114*  BUN 10  CREATININE 0.92  CALCIUM 8.4*   GFR: CrCl cannot be calculated (Unknown ideal weight.). Liver Function Tests:  Recent Labs  Lab 08/05/19 2020  AST 36  ALT 21  ALKPHOS 41  BILITOT 1.0  PROT 7.0  ALBUMIN 3.1*   No results for input(s): LIPASE, AMYLASE in the last 168 hours. No results for input(s): AMMONIA in the last 168 hours. Coagulation Profile: No results for input(s): INR, PROTIME in the last 168 hours. Cardiac Enzymes: No results for input(s): CKTOTAL, CKMB, CKMBINDEX, TROPONINI in the last 168 hours. BNP (last 3 results) No results for input(s): PROBNP in the last 8760 hours. HbA1C: No results for input(s): HGBA1C  in the last 72 hours. CBG: No results for input(s): GLUCAP in the last 168 hours. Lipid Profile: No results for input(s): CHOL, HDL, LDLCALC, TRIG, CHOLHDL, LDLDIRECT in the last 72 hours. Thyroid Function Tests: No results for input(s): TSH, T4TOTAL, FREET4, T3FREE, THYROIDAB in the last 72 hours. Anemia Panel: No results for input(s): VITAMINB12, FOLATE, FERRITIN, TIBC, IRON, RETICCTPCT in the last 72 hours. Urine analysis:    Component Value Date/Time   COLORURINE YELLOW 08/05/2019 2330   APPEARANCEUR HAZY (A) 08/05/2019 2330   LABSPEC 1.024 08/05/2019 2330   PHURINE 5.0 08/05/2019 2330   GLUCOSEU NEGATIVE 08/05/2019 2330   HGBUR SMALL (A) 08/05/2019 2330   BILIRUBINUR NEGATIVE 08/05/2019 2330   KETONESUR 5 (A) 08/05/2019 2330   PROTEINUR 30 (A) 08/05/2019 2330   NITRITE NEGATIVE 08/05/2019 2330   LEUKOCYTESUR SMALL (A) 08/05/2019 2330   Sepsis Labs: @LABRCNTIP (procalcitonin:4,lacticidven:4) ) Recent Results (from the past 240 hour(s))  Respiratory Panel by RT PCR (Flu A&B, Covid) - Nasopharyngeal Swab     Status: Abnormal   Collection Time: 08/05/19  9:18 PM   Specimen: Nasopharyngeal Swab  Result Value Ref Range Status   SARS Coronavirus 2 by RT PCR POSITIVE (A) NEGATIVE Final    Comment: RESULT CALLED TO, READ BACK BY AND VERIFIED WITH: O RAGNER RN 08/05/19 2319 JDW (NOTE) SARS-CoV-2 target nucleic acids are DETECTED. SARS-CoV-2 RNA is generally detectable in upper respiratory specimens  during the acute phase of infection. Positive results are indicative of the presence of the identified virus, but do not rule out bacterial infection or co-infection with other pathogens not detected by the test. Clinical correlation with patient history and other diagnostic information is necessary to determine patient infection status. The expected result is Negative. Fact Sheet for Patients:  PinkCheek.be Fact Sheet for Healthcare  Providers: GravelBags.it This test is not yet approved or cleared by the [place] FDA and  has been authorized for detection and/or diagnosis of SARS-CoV-2 by FDA under an Emergency Use Authorization (EUA).  This EUA will remain in effect (meaning this test can be used) for the  duration of  the COVID-19 declaration under Section 564(b)(1) of the Act, 21 U.S.C. section 360bbb-3(b)(1), unless the authorization is terminated or revoked sooner.    Influenza A by PCR NEGATIVE NEGATIVE Final   Influenza B by PCR NEGATIVE NEGATIVE Final    Comment: (NOTE) The Xpert Xpress SARS-CoV-2/FLU/RSV assay is intended as an aid in  the diagnosis of influenza from Nasopharyngeal swab specimens and  should not be used as a sole basis for treatment. Nasal washings and  aspirates are unacceptable for Xpert Xpress SARS-CoV-2/FLU/RSV  testing. Fact Sheet for Patients: PinkCheek.be Fact Sheet for Healthcare Providers: GravelBags.it This test is not yet approved or cleared by the [place] FDA and  has been authorized for detection and/or diagnosis of SARS-CoV-2 by  FDA under an Emergency Use Authorization (EUA). This EUA will remain  in effect (  meaning this test can be used) for the duration of the  Covid-19 declaration under Section 564(b)(1) of the Act, 21  U.S.C. section 360bbb-3(b)(1), unless the authorization is  terminated or revoked. Performed at [facility], [place] [address] [place], [place] [address]      Radiological Exams on Admission: CT Chest W Contrast  Result Date: 08/05/2019 CLINICAL DATA:  Shortness of breath. Hiccups. Fever. EXAM: CT CHEST WITH CONTRAST TECHNIQUE: Multidetector CT imaging of the chest was performed during intravenous contrast administration. CONTRAST:  37mL OMNIPAQUE IOHEXOL 300 MG/ML  SOLN COMPARISON:  Radiograph earlier this day. FINDINGS: Cardiovascular: Aortic  atherosclerosis. No dissection. No filling defects in the central most pulmonary arteries to the proximal lobar level. Heart is normal in size. There are coronary artery calcifications. No pericardial effusion. Mediastinum/Nodes: Prominent lower paratracheal node measures 9 mm. No hilar adenopathy. Patulous upper esophagus with wall thickening distally. Lower most chest including the gastroesophageal junction are obscured by motion artifact. Lungs/Pleura: Moderate apical predominant emphysema. Bronchial thickening in the mid lower lung zones with areas of mucous plugging involving the right middle and both lower lobes. Breathing motion artifact significantly limits assessment of the lung bases. There is patchy airspace disease in the right middle lobe and lower lobes. Patchy and consolidative opacity at the left lung base, with significant mucous plugging in this region. No significant pleural effusion small perifissural area of scarring in the right upper lobe. Minimal retained mucus in the dependent trachea. There is a pleural based 7 mm left lower lobe nodule, series 4, image 137. Upper Abdomen: 9 mm low-density indeterminate lesion in the posterior right kidney. Hepatic steatosis. Motion artifact in the upper abdomen. Musculoskeletal: There are no acute or suspicious osseous abnormalities. IMPRESSION: 1. Bronchial thickening with areas of mucous plugging. Patchy consolidation in the right middle and right lower lobe in the areas of mucous plugging which may be postobstructive atelectasis/pneumonia. 2. Dense consolidation in the left lower lobe posteriorly, with likely significant mucous plugging in this region, however motion artifact limits detailed assessment. Favor pneumonia. Recommend radiographic follow-up. 3. Patulous upper esophagus with wall thickening distally, can be seen with reflux or esophagitis. 4. Emphysema. 5. Coronary artery calcifications. 6. A 9 mm low-density lesion in the posterior right  kidney is too small to accurately characterize, likely a minimally complex cyst. Aortic Atherosclerosis (ICD10-I70.0) and Emphysema (ICD10-J43.9). Electronically Signed   By: Keith Rake M.D.   On: 08/05/2019 23:17   DG Chest Portable 1 View  Result Date: 08/05/2019 CLINICAL DATA:  Fever. EXAM: PORTABLE CHEST 1 VIEW COMPARISON:  Chest radiograph 04/10/2013 FINDINGS: Monitoring leads overlie the patient. Normal cardiac and mediastinal contours. Bibasilar heterogeneous opacities, left greater than right. No pleural effusion or pneumothorax. IMPRESSION: Left-greater-than-right basilar heterogeneous opacities may represent pneumonia in the appropriate clinical setting. Followup PA and lateral chest X-ray is recommended in 3-4 weeks following trial of antibiotic therapy to ensure resolution and exclude underlying malignancy. Electronically Signed   By: Lovey Newcomer M.D.   On: 08/05/2019 20:28    EKG: Independently reviewed.  Normal sinus rhythm.  Nonspecific ST changes in anteroseptal leads.  Assessment/Plan Active Problems:   Pneumonia due to COVID-19 virus    1. Pneumonia due to Covid and possible postobstructive which changes for which patient will be on remdesivir and Decadron was already started in the ER.  Check CRP D-dimer ferritin.  Follow cultures.  For now I will continue empiric antibiotics until get procalcitonin levels. 2. Acute encephalopathy likely from fever  essentially resolved at this time.  Always in the patient has history of alcoholism we will get a CT head.  Check ammonia levels. 3. Possible UTI on empiric antibiotics follow urine cultures. 4. Possible postobstructive pneumonia with history of tobacco abuse eventually will need bronchoscopy and also close follow-up with repeat scanning to rule out any eventual mass.  5. Hyponatremia could be from alcohol abuse.  Follow metabolic panel closely check serum osmolality urine osmolality urine sodium TSH and cortisol levels.  Based  on which we will have further plans.  Does not look fluid overloaded or dehydrated. 6. History of alcohol abuse and tobacco abuse advised about quitting social work consult. 7. Hiccups -cause not clear.  Closely observe.  Given that patient has pneumonia with Covid infection and postobstructive features will need close monitoring for any further deterioration in inpatient status.    DVT prophylaxis: Lovenox. Code Status: Full code. Family Communication: I tried to reach patient's significant other than mentioned in the chart was unable to reach. Disposition Plan: To be determined. Consults called: None. Admission status: Inpatient.   Rise Patience MD [facility] Pager [phone].  If 7PM-7AM, please contact night-coverage [email or web address] Password [facility]  08/06/2019, 12:04 AM

## 2019-08-06 NOTE — Plan of Care (Signed)
Patient admitted for Covid=19 Pneumoinia. Arrived otp the imot at 0350 . Patient is alert and disoriented to time, able to answer questions appropriately.  Skin assessment done with second RN. Skin intact with dry flaky legs/feet.  Vital sigs stable. Patient on room air saturating in 88-90.  Patient was placed on 2L O2 nasal canula. Telemetry set in place and verified.  Patient was oriented to room and equipment. Will continue to monitor

## 2019-08-06 NOTE — Progress Notes (Signed)
PHARMACY - PHYSICIAN COMMUNICATION CRITICAL VALUE ALERT - BLOOD CULTURE IDENTIFICATION (BCID)  Robert Molina is an 69 y.o. male who presented to [facility] on 08/05/2019 with a chief complaint of altered mental status and COVID-19 pneumonia.   Assessment:  Patient with known COVID19 pneumonia. 1 out of 4 blood cultures (anaerobic bottle only) growing GPC clusters. BCID showing Staph species, mec a not detected. No history of resistant organisms found. NKDA. WBC within normal limits. PCT 1.14. CRP 14.4. LA within normal limits. Tmax 99.6.   Name of physician (or Provider) ContactedAlger Memos, NP  Current antibiotics:  Ceftriaxone, Azithromycin  Changes to prescribed antibiotics recommended:  Patient is on recommended antibiotics - No changes needed. Blood culture likely contaminant.   No results found for this or any previous visit.  Sloan Leiter, PharmD, BCPS, BCCCP Clinical Pharmacist Please refer to [facility] for [place] numbers 08/06/2019  7:57 PM

## 2019-08-07 ENCOUNTER — Inpatient Hospital Stay (HOSPITAL_COMMUNITY): Payer: Medicare Other

## 2019-08-07 LAB — BASIC METABOLIC PANEL
Anion gap: 11 (ref 5–15)
BUN: 13 mg/dL (ref 8–23)
CO2: 23 mmol/L (ref 22–32)
Calcium: 8.4 mg/dL — ABNORMAL LOW (ref 8.9–10.3)
Chloride: 97 mmol/L — ABNORMAL LOW (ref 98–111)
Creatinine, Ser: 0.79 mg/dL (ref 0.61–1.24)
GFR calc Af Amer: >60 mL/min (ref >60)
GFR calc non Af Amer: >60 mL/min (ref >60)
Glucose, Bld: 151 mg/dL — ABNORMAL HIGH (ref 70–99)
Potassium: 4.1 mmol/L (ref 3.5–5.1)
Sodium: 131 mmol/L — ABNORMAL LOW (ref 135–145)

## 2019-08-07 LAB — COMPREHENSIVE METABOLIC PANEL
ALT: 16 U/L (ref 0–44)
AST: 33 U/L (ref 15–41)
Albumin: 2.6 g/dL — ABNORMAL LOW (ref 3.5–5.0)
Alkaline Phosphatase: 36 U/L — ABNORMAL LOW (ref 38–126)
Anion gap: 9 (ref 5–15)
BUN: 13 mg/dL (ref 8–23)
CO2: 25 mmol/L (ref 22–32)
Calcium: 8.5 mg/dL — ABNORMAL LOW (ref 8.9–10.3)
Chloride: 97 mmol/L — ABNORMAL LOW (ref 98–111)
Creatinine, Ser: 0.81 mg/dL (ref 0.61–1.24)
GFR calc Af Amer: >60 mL/min (ref >60)
GFR calc non Af Amer: >60 mL/min (ref >60)
Glucose, Bld: 142 mg/dL — ABNORMAL HIGH (ref 70–99)
Potassium: 4.4 mmol/L (ref 3.5–5.1)
Sodium: 131 mmol/L — ABNORMAL LOW (ref 135–145)
Total Bilirubin: 1 mg/dL (ref 0.3–1.2)
Total Protein: 6.3 g/dL — ABNORMAL LOW (ref 6.5–8.1)

## 2019-08-07 LAB — CBC WITH DIFFERENTIAL/PLATELET
Abs Immature Granulocytes: 0.14 10*3/uL — ABNORMAL HIGH (ref 0.00–0.07)
Basophils Absolute: 0 10*3/uL (ref 0.0–0.1)
Basophils Relative: 1 %
Eosinophils Absolute: 0 10*3/uL (ref 0.0–0.5)
Eosinophils Relative: 0 %
HCT: 44.5 % (ref 39.0–52.0)
Hemoglobin: 15 g/dL (ref 13.0–17.0)
Immature Granulocytes: 2 %
Lymphocytes Relative: 10 %
Lymphs Abs: 0.6 10*3/uL — ABNORMAL LOW (ref 0.7–4.0)
MCH: 30.7 pg (ref 26.0–34.0)
MCHC: 33.7 g/dL (ref 30.0–36.0)
MCV: 91.2 fL (ref 80.0–100.0)
Monocytes Absolute: 1.2 10*3/uL — ABNORMAL HIGH (ref 0.1–1.0)
Monocytes Relative: 18 %
Neutro Abs: 4.6 10*3/uL (ref 1.7–7.7)
Neutrophils Relative %: 69 %
Platelets: 241 10*3/uL (ref 150–400)
RBC: 4.88 MIL/uL (ref 4.22–5.81)
RDW: 15.1 % (ref 11.5–15.5)
WBC: 6.6 10*3/uL (ref 4.0–10.5)
nRBC: 0 % (ref 0.0–0.2)

## 2019-08-07 LAB — C-REACTIVE PROTEIN: CRP: 9.7 mg/dL — ABNORMAL HIGH (ref <1.0)

## 2019-08-07 LAB — PROCALCITONIN: Procalcitonin: 1.07 ng/mL

## 2019-08-07 LAB — BRAIN NATRIURETIC PEPTIDE: B Natriuretic Peptide: 186.4 pg/mL — ABNORMAL HIGH (ref 0.0–100.0)

## 2019-08-07 LAB — D-DIMER, QUANTITATIVE: D-Dimer, Quant: 2.14 ug/mL-FEU — ABNORMAL HIGH (ref 0.00–0.50)

## 2019-08-07 LAB — MAGNESIUM: Magnesium: 2 mg/dL (ref 1.7–2.4)

## 2019-08-07 LAB — URIC ACID: Uric Acid, Serum: 4.6 mg/dL (ref 3.7–8.6)

## 2019-08-07 MED ORDER — SODIUM CHLORIDE 0.9 % IV SOLN: INTRAVENOUS | Status: DC

## 2019-08-07 MED ORDER — CHLORHEXIDINE GLUCONATE CLOTH 2 % EX PADS
6.0000 | MEDICATED_PAD | Freq: Every day | CUTANEOUS | Status: DC
  Administered 2019-08-07 – 2019-08-09 (×2): 6 via TOPICAL

## 2019-08-07 NOTE — Progress Notes (Signed)
PROGRESS NOTE                                                                                                                                                                                                             Patient Demographics:    Robert Molina, is a 69 y.o. male, DOB - [birth year], TGY:[id number]  Outpatient Primary MD for the patient is Patient, No Pcp Per    LOS - 2  Admit date - 08/05/2019    Chief Complaint  Patient presents with  . Altered Mental Status       Brief Narrative - Robert Molina is a 69 y.o. male with no significant past medical history with chronic tobacco and alcohol abuse was found to be increasingly confused by patient's neighbors and EMS was alerted and was brought to the ER.  Patient states he has been having hiccups for the last few days denies any chest pain shortness of breath headache nausea vomiting or diarrhea.  Denies any productive cough.  Admits to drinking alcohol about 2 beers every day, in the ER he was diagnosed with COVID-19 pneumonia along with signs of bacterial pneumonia versus chronic aspiration.  Admitted to the hospital.   Subjective:   Patient in bed, appears comfortable, denies any headache, no fever, no chest pain or pressure, no shortness of breath , no abdominal pain. No focal weakness.   Assessment  & Plan :     1. Acute Hypoxic Resp. Failure due to Acute Covid 19 Viral Pneumonitis during the ongoing 2020 Covid 19 Pandemic - he has mild to moderate disease, suspicion for having both COVID-19 pneumonia along with possible aspiration pneumonia.  New combination of steroids, remdesivir and IV antibiotics.  Will have speech evaluate and continue aspiration precautions, flutter valve and I-S added for pulmonary toiletry.  Encouraged the patient to sit up in chair in the daytime use I-S and flutter valve for pulmonary toiletry and then prone in bed when at night.  Will  advance activity and titrate down oxygen as possible.    SpO2: 90 % O2 Flow Rate (L/min): 0 L/min FiO2 (%): 21 %  Recent Labs  Lab 08/05/19 2118 08/06/19 0242 08/06/19 0735 08/07/19 0514  CRP  --  11.1* 12.2* 9.7*  DDIMER  --  2.89* 2.88* 2.14*  BNP  --   --  134.8* 186.4*  PROCALCITON  --  1.14  --  1.07  SARSCOV2NAA POSITIVE*  --   --   --     Hepatic Function Latest Ref Rng & Units 08/07/2019 08/06/2019 08/05/2019  Total Protein 6.5 - 8.1 g/dL 6.3(L) 6.5 7.0  Albumin 3.5 - 5.0 g/dL 2.6(L) 2.7(L) 3.1(L)  AST 15 - 41 U/L 33 30 36  ALT 0 - 44 U/L _0 Alk Phosphatase 38 - 126 U/L 36(L) 37(L) 41  Total Bilirubin 0.3 - 1.2 mg/dL 1.0 0.5 1.0  Bilirubin, Direct 0.0 - 0.2 mg/dL - 0.1 -    2.  Ongoing hiccups.  Thorazine added.  If they continue CT abdomen pelvis will be done to rule out any issues with his diaphragm.  3.  Alcohol intake.  Denies any abuse.  Monitor on CIWA protocol.  On folic acid and thiamine.  4.  Generalized weakness.  PT OT consult and monitor.  5.  Dehydration with hyponatremia.  Improving with IV fluids continue.  6.  Possible esophagitis.  PPI with outpatient GI follow-up.  7.  Urinary retention.  Placed on Flomax, continues to retain, Foley will be placed on 08/07/2019.  Outpatient urology follow-up post discharge.    Condition - Fair  Family Communication  Annia Friendly [phone] on 08/06/2019 at 11 AM.  No voicemail set.  Code Status :  Full  Diet :   Diet Order            Diet regular Room service appropriate? Yes; Fluid consistency: Thin  Diet effective now               Disposition Plan  :    Status is: Inpatient  Remains inpatient appropriate because:IV treatments appropriate due to intensity of illness or inability to take PO   Dispo: The patient is from: Home              Anticipated d/c is to: Home              Anticipated d/c date is: > 3 days              Patient currently is not medically stable to  d/c.  Consults  :  None  Procedures  :    CT - 1. Bronchial thickening with areas of mucous plugging. Patchy consolidation in the right middle and right lower lobe in the areas of mucous plugging which may be postobstructive atelectasis/pneumonia. 2. Dense consolidation in the left lower lobe posteriorly, with likely significant mucous plugging in this region, however motion artifact limits detailed assessment. Favor pneumonia. Recommend radiographic follow-up. 3. Patulous upper esophagus with wall thickening distally, can be seen with reflux or esophagitis. 4. Emphysema. 5. Coronary artery calcifications. 6. A 9 mm low-density lesion in the posterior right kidney is too small to accurately characterize, likely a minimally complex cyst. Aortic Atherosclerosis (ICD10-I70.0) and Emphysema  PUD Prophylaxis : PPI  DVT Prophylaxis  :  Lovenox   Lab Results  Component Value Date   PLT 241 08/07/2019    Inpatient Medications  Scheduled Meds: . dexamethasone (DECADRON) injection  6 mg Intravenous Q24H  . enoxaparin (LOVENOX) injection  40 mg Subcutaneous Daily  . folic acid  1 mg Oral Daily  . multivitamin with minerals  1 tablet Oral Daily  . pantoprazole  40 mg Oral Daily  . tamsulosin  0.4 mg Oral Daily  . thiamine  100 mg Oral Daily   Or  .  thiamine  100 mg Intravenous Daily   Continuous Infusions: . sodium chloride    . azithromycin 500 mg (08/07/19 1000)  . cefTRIAXone (ROCEPHIN)  IV 2 g (08/06/19 2122)  . chlorproMAZINE (THORAZINE) IV    . remdesivir 100 mg in NS 100 mL 100 mg (08/07/19 1001)   PRN Meds:.chlorproMAZINE (THORAZINE) IV, LORazepam **OR** LORazepam, [DISCONTINUED] ondansetron **OR** ondansetron (ZOFRAN) IV  Antibiotics  :    Anti-infectives (From admission, onward)   Start     Dose/Rate Route Frequency Ordered Stop   08/07/19 1000  remdesivir 100 mg in sodium chloride 0.9 % 100 mL IVPB     100 mg 200 mL/hr over 30 Minutes Intravenous Daily 08/06/19 0009  08/11/19 0959   08/06/19 2200  azithromycin (ZITHROMAX) 500 mg in sodium chloride 0.9 % 250 mL IVPB  Status:  Discontinued     500 mg 250 mL/hr over 60 Minutes Intravenous Every 24 hours 08/06/19 0636 08/06/19 0734   08/06/19 2100  cefTRIAXone (ROCEPHIN) 2 g in sodium chloride 0.9 % 100 mL IVPB     2 g 200 mL/hr over 30 Minutes Intravenous Every 24 hours 08/06/19 0636 08/11/19 2059   08/06/19 0800  azithromycin (ZITHROMAX) 500 mg in sodium chloride 0.9 % 250 mL IVPB     500 mg 250 mL/hr over 60 Minutes Intravenous Every 24 hours 08/06/19 0737     08/06/19 0100  remdesivir 200 mg in sodium chloride 0.9% 250 mL IVPB     200 mg 580 mL/hr over 30 Minutes Intravenous Once 08/06/19 0009 08/06/19 0241   08/05/19 2100  cefTRIAXone (ROCEPHIN) 1 g in sodium chloride 0.9 % 100 mL IVPB     1 g 200 mL/hr over 30 Minutes Intravenous  Once 08/05/19 2034 08/05/19 2150   08/05/19 2100  azithromycin (ZITHROMAX) 500 mg in sodium chloride 0.9 % 250 mL IVPB     500 mg 250 mL/hr over 60 Minutes Intravenous  Once 08/05/19 2034 08/05/19 2314       Time Spent in minutes  30   Lala Lund M.D on 08/07/2019 at 11:17 AM  To page go to [email or web address] - password [email or web address]  [facility] -  Office  [phone]     See all Orders from today for further details    Objective:   Vitals:   08/06/19 2200 08/07/19 0322 08/07/19 0523 08/07/19 0938  BP:  (!) 156/79 (!) 126/93 (!) 154/91  Pulse: 80 62 69 74  Resp:  19 20 (!) 22  Temp:  98.5 F (36.9 C) 98.7 F (37.1 C) 98.2 F (36.8 C)  TempSrc:  Oral Oral Oral  SpO2:  95% 93% 90%  Weight:      Height:        Wt Readings from Last 3 Encounters:  08/06/19 61.6 kg  05/06/13 64.4 kg  04/30/13 64.4 kg     Intake/Output Summary (Last 24 hours) at 08/07/2019 1117 Last data filed at 08/06/2019 1500 Gross per 24 hour  Intake 273.63 ml  Output --  Net 273.63 ml     Physical Exam  Awake Alert, No new F.N deficits, Normal  affect [place].AT,PERRAL Supple Neck,No JVD, No cervical lymphadenopathy appriciated.  Symmetrical Chest wall movement, Good air movement bilaterally, CTAB RRR,No Gallops, Rubs or new Murmurs, No Parasternal Heave +ve B.Sounds, Abd Soft, No tenderness, No organomegaly appriciated, No rebound - guarding or rigidity. No Cyanosis, Clubbing or edema, No new Rash or bruise    Data Review:    CBC Recent Labs  Lab 08/05/19 2020 08/06/19 0242 08/06/19 0735 08/07/19 0514  WBC 5.5 5.0 4.9 6.6  HGB 13.6 13.2 14.2 15.0  HCT 39.4 38.9* 41.6 44.5  PLT 213 204 219 241  MCV 89.3 89.2 88.5 91.2  MCH 30.8 30.3 30.2 30.7  MCHC 34.5 33.9 34.1 33.7  RDW 14.6 14.7 14.6 15.1  LYMPHSABS 0.5*  --   --  0.6*  MONOABS 1.6*  --   --  1.2*  EOSABS 0.0  --   --  0.0  BASOSABS 0.0  --   --  0.0    Chemistries  Recent Labs  Lab 08/05/19 2020 08/05/19 2020 08/06/19 0242 08/06/19 0704 08/06/19 0735 08/06/19 1633 08/07/19 0043 08/07/19 0514  NA 123*  --   --  128*  --  130* 131* 131*  K 4.1  --   --  4.3  --  4.4 4.1 4.4  CL 89*  --   --  93*  --  93* 97* 97*  CO2 23  --   --  24  --  _0 GLUCOSE 114*  --   --  153*  --  147* 151* 142*  BUN 10  --   --  9  --  _1 CREATININE 0.92   < > 0.91 0.85  --  0.87 0.79 0.81  CALCIUM 8.4*  --   --  8.5*  --  8.5* 8.4* 8.5*  AST 36  --   --  30  --   --   --  33  ALT 21  --   --  20  --   --   --  16  ALKPHOS 41  --   --  37*  --   --   --  36*  BILITOT 1.0  --   --  0.5  --   --   --  1.0  MG  --   --   --  1.9 1.9  --   --  2.0  AMMONIA  --   --   --  25  --   --   --   --   TSH  --   --   --  0.168*  --   --   --   --    < > = values in this interval not displayed.     ------------------------------------------------------------------------------------------------------------------ No results for input(s): CHOL, HDL, LDLCALC, TRIG, CHOLHDL, LDLDIRECT in the last 72 hours.  No results found for:  HGBA1C ------------------------------------------------------------------------------------------------------------------ Recent Labs    08/06/19 0704  TSH 0.168*    Cardiac Enzymes No results for input(s): CKMB, TROPONINI, MYOGLOBIN in the last 168 hours.  Invalid input(s): CK ------------------------------------------------------------------------------------------------------------------    Component Value Date/Time   BNP 186.4 (H) 08/07/2019 9629    Micro Results Recent Results (from the past 240 hour(s))  Respiratory Panel by RT PCR (Flu A&B, Covid) - Nasopharyngeal Swab     Status: Abnormal   Collection Time: 08/05/19  9:18 PM   Specimen: Nasopharyngeal Swab  Result Value Ref Range Status   SARS Coronavirus 2 by RT PCR POSITIVE (A) NEGATIVE Final    Comment: RESULT CALLED TO, READ BACK BY AND VERIFIED WITH: O RAGNER RN 08/05/19 2319 JDW (NOTE) SARS-CoV-2 target nucleic acids are DETECTED. SARS-CoV-2 RNA is generally detectable in upper respiratory specimens  during the acute phase of infection. Positive results are indicative of the presence of the identified virus, but do not rule out  bacterial infection or co-infection with other pathogens not detected by the test. Clinical correlation with patient history and other diagnostic information is necessary to determine patient infection status. The expected result is Negative. Fact Sheet for Patients:  PinkCheek.be Fact Sheet for Healthcare Providers: GravelBags.it This test is not yet approved or cleared by the [place] FDA and  has been authorized for detection and/or diagnosis of SARS-CoV-2 by FDA under an Emergency Use Authorization (EUA).  This EUA will remain in effect (meaning this test can be used) for the  duration of  the COVID-19 declaration under Section 564(b)(1) of the Act, 21 U.S.C. section 360bbb-3(b)(1), unless the authorization  is terminated or revoked sooner.    Influenza A by PCR NEGATIVE NEGATIVE Final   Influenza B by PCR NEGATIVE NEGATIVE Final    Comment: (NOTE) The Xpert Xpress SARS-CoV-2/FLU/RSV assay is intended as an aid in  the diagnosis of influenza from Nasopharyngeal swab specimens and  should not be used as a sole basis for treatment. Nasal washings and  aspirates are unacceptable for Xpert Xpress SARS-CoV-2/FLU/RSV  testing. Fact Sheet for Patients: PinkCheek.be Fact Sheet for Healthcare Providers: GravelBags.it This test is not yet approved or cleared by the [place] FDA and  has been authorized for detection and/or diagnosis of SARS-CoV-2 by  FDA under an Emergency Use Authorization (EUA). This EUA will remain  in effect (meaning this test can be used) for the duration of the  Covid-19 declaration under Section 564(b)(1) of the Act, 21  U.S.C. section 360bbb-3(b)(1), unless the authorization is  terminated or revoked. Performed at [facility], [place] [address], [place], [place] [address]   Culture, blood (routine x 2)     Status: None (Preliminary result)   Collection Time: 08/05/19  9:32 PM   Specimen: BLOOD RIGHT FOREARM  Result Value Ref Range Status   Specimen Description BLOOD RIGHT FOREARM  Final   Special Requests   Final    BOTTLES DRAWN AEROBIC AND ANAEROBIC Blood Culture adequate volume   Culture  Setup Time   Final    ANAEROBIC BOTTLE ONLY GRAM POSITIVE COCCI IN CLUSTERS CRITICAL RESULT CALLED TO, READ BACK BY AND VERIFIED WITHDion Body [facility] 08/06/19 1956 JDW Performed at [facility], [place] [address], [place], [place] [address]    Culture GRAM POSITIVE COCCI  Final   Report Status PENDING  Incomplete  Blood Culture ID Panel (Reflexed)     Status: Abnormal   Collection Time: 08/05/19  9:32 PM  Result Value Ref Range Status   Enterococcus species NOT DETECTED NOT DETECTED Final   Listeria  monocytogenes NOT DETECTED NOT DETECTED Final   Staphylococcus species DETECTED (A) NOT DETECTED Final    Comment: Methicillin (oxacillin) susceptible coagulase negative staphylococcus. Possible blood culture contaminant (unless isolated from more than one blood culture draw or clinical case suggests pathogenicity). No antibiotic treatment is indicated for blood  culture contaminants. CRITICAL RESULT CALLED TO, READ BACK BY AND VERIFIED WITH: Dion Body [facility] 08/06/19 1956 JDW    Staphylococcus aureus (BCID) NOT DETECTED NOT DETECTED Final   Methicillin resistance NOT DETECTED NOT DETECTED Final   Streptococcus species NOT DETECTED NOT DETECTED Final   Streptococcus agalactiae NOT DETECTED NOT DETECTED Final   Streptococcus pneumoniae NOT DETECTED NOT DETECTED Final   Streptococcus pyogenes NOT DETECTED NOT DETECTED Final   Acinetobacter baumannii NOT DETECTED NOT DETECTED Final   Enterobacteriaceae species NOT DETECTED NOT DETECTED Final   Enterobacter cloacae complex NOT  DETECTED NOT DETECTED Final   Escherichia coli NOT DETECTED NOT DETECTED Final   Klebsiella oxytoca NOT DETECTED NOT DETECTED Final   Klebsiella pneumoniae NOT DETECTED NOT DETECTED Final   Proteus species NOT DETECTED NOT DETECTED Final   Serratia marcescens NOT DETECTED NOT DETECTED Final   Haemophilus influenzae NOT DETECTED NOT DETECTED Final   Neisseria meningitidis NOT DETECTED NOT DETECTED Final   Pseudomonas aeruginosa NOT DETECTED NOT DETECTED Final   Candida albicans NOT DETECTED NOT DETECTED Final   Candida glabrata NOT DETECTED NOT DETECTED Final   Candida krusei NOT DETECTED NOT DETECTED Final   Candida parapsilosis NOT DETECTED NOT DETECTED Final   Candida tropicalis NOT DETECTED NOT DETECTED Final    Comment: Performed at [facility], [place] [address] [place], [place] [address]  Culture, blood (routine x 2)     Status: None (Preliminary result)   Collection Time: 08/05/19  9:33 PM   Specimen:  BLOOD LEFT FOREARM  Result Value Ref Range Status   Specimen Description BLOOD LEFT FOREARM  Final   Special Requests   Final    BOTTLES DRAWN AEROBIC AND ANAEROBIC Blood Culture adequate volume   Culture   Final    NO GROWTH < 12 HOURS Performed at [facility], [place] [address], [place], [place] [address]    Report Status PENDING  Incomplete    Radiology Reports CT Chest W Contrast  Result Date: 08/05/2019 CLINICAL DATA:  Shortness of breath. Hiccups. Fever. EXAM: CT CHEST WITH CONTRAST TECHNIQUE: Multidetector CT imaging of the chest was performed during intravenous contrast administration. CONTRAST:  40m OMNIPAQUE IOHEXOL 300 MG/ML  SOLN COMPARISON:  Radiograph earlier this day. FINDINGS: Cardiovascular: Aortic atherosclerosis. No dissection. No filling defects in the central most pulmonary arteries to the proximal lobar level. Heart is normal in size. There are coronary artery calcifications. No pericardial effusion. Mediastinum/Nodes: Prominent lower paratracheal node measures 9 mm. No hilar adenopathy. Patulous upper esophagus with wall thickening distally. Lower most chest including the gastroesophageal junction are obscured by motion artifact. Lungs/Pleura: Moderate apical predominant emphysema. Bronchial thickening in the mid lower lung zones with areas of mucous plugging involving the right middle and both lower lobes. Breathing motion artifact significantly limits assessment of the lung bases. There is patchy airspace disease in the right middle lobe and lower lobes. Patchy and consolidative opacity at the left lung base, with significant mucous plugging in this region. No significant pleural effusion small perifissural area of scarring in the right upper lobe. Minimal retained mucus in the dependent trachea. There is a pleural based 7 mm left lower lobe nodule, series 4, image 137. Upper Abdomen: 9 mm low-density indeterminate lesion in the posterior right kidney. Hepatic steatosis.  Motion artifact in the upper abdomen. Musculoskeletal: There are no acute or suspicious osseous abnormalities. IMPRESSION: 1. Bronchial thickening with areas of mucous plugging. Patchy consolidation in the right middle and right lower lobe in the areas of mucous plugging which may be postobstructive atelectasis/pneumonia. 2. Dense consolidation in the left lower lobe posteriorly, with likely significant mucous plugging in this region, however motion artifact limits detailed assessment. Favor pneumonia. Recommend radiographic follow-up. 3. Patulous upper esophagus with wall thickening distally, can be seen with reflux or esophagitis. 4. Emphysema. 5. Coronary artery calcifications. 6. A 9 mm low-density lesion in the posterior right kidney is too small to accurately characterize, likely a minimally complex cyst. Aortic Atherosclerosis (ICD10-I70.0) and Emphysema (ICD10-J43.9). Electronically Signed   By: MAurther LoftD.  On: 08/05/2019 23:17   DG Chest Port 1 View  Result Date: 08/07/2019 CLINICAL DATA:  Shortness of breath, COVID EXAM: PORTABLE CHEST 1 VIEW COMPARISON:  08/06/2019 FINDINGS: The heart size and mediastinal contours are within normal limits. Both lungs are clear. The visualized skeletal structures are unremarkable. IMPRESSION: No acute abnormality of the lungs in AP portable projection. Electronically Signed   By: Eddie Candle M.D.   On: 08/07/2019 08:55   DG Chest Port 1 View  Result Date: 08/06/2019 CLINICAL DATA:  Shortness of breath. EXAM: PORTABLE CHEST 1 VIEW COMPARISON:  08/05/2019 and CT 08/05/2018 FINDINGS: Lungs are adequately inflated demonstrate stable hazy opacification over the left base/retrocardiac region and medial right base. No definite effusion. Cardiomediastinal silhouette and remainder the exam is unchanged. IMPRESSION: Persistent mild bibasilar opacification most prominent in the left base/retrocardiac region compatible recent CT findings suggesting pneumonia.  Electronically Signed   By: Marin Olp M.D.   On: 08/06/2019 08:20   DG Chest Portable 1 View  Result Date: 08/05/2019 CLINICAL DATA:  Fever. EXAM: PORTABLE CHEST 1 VIEW COMPARISON:  Chest radiograph 04/10/2013 FINDINGS: Monitoring leads overlie the patient. Normal cardiac and mediastinal contours. Bibasilar heterogeneous opacities, left greater than right. No pleural effusion or pneumothorax. IMPRESSION: Left-greater-than-right basilar heterogeneous opacities may represent pneumonia in the appropriate clinical setting. Followup PA and lateral chest X-ray is recommended in 3-4 weeks following trial of antibiotic therapy to ensure resolution and exclude underlying malignancy. Electronically Signed   By: Lovey Newcomer M.D.   On: 08/05/2019 20:28

## 2019-08-07 NOTE — Evaluation (Signed)
Clinical/Bedside Swallow Evaluation Patient Details  Name: Robert Molina MRN: [id number] Date of Birth: 07/24/1950  Today's Date: 08/07/2019 Time: SLP Start Time (ACUTE ONLY): 1500 SLP Stop Time (ACUTE ONLY): 1510 SLP Time Calculation (min) (ACUTE ONLY): 10 min  Past Medical History:  Past Medical History:  Diagnosis Date  . Pneumonia    hx of about  51yrs ago   Past Surgical History:  Past Surgical History:  Procedure Laterality Date  . PAROTIDECTOMY Right 05/06/2013   Procedure: PAROTIDECTOMY;  Surgeon: Ruby Cola, MD;  Location: [facility];  Service: ENT;  Laterality: Right;  With Nerve Monitor  . RADICAL NECK DISSECTION Right 05/06/2013   Procedure: RADICAL NECK DISSECTION;  Surgeon: Ruby Cola, MD;  Location: [facility];  Service: ENT;  Laterality: Right;   HPI:  Robert Molina is a 69 y.o. male with a history of parotidectomy with chronic tobacco and alcohol abuse was found to be increasingly confused by patient's neighbors and EMS was alerted and was brought to the ER.  Patient states he has been having hiccups for the last few days denies any chest pain shortness of breath headache nausea vomiting or diarrhea.  Denies any productive cough.  Admits to drinking alcohol about 2 beers every day, in the ER he was diagnosed with COVID-19 pneumonia along with signs of bacterial pneumonia versus chronic aspiration.  CT chest with contrast shows bilateral consolidation with mucous plugging and postobstructive pneumonia features. Prominent lower paratracheal node measures 9 mm. Patulous upper esophagus with wall thickening distally, can be seen with reflux or esophagitis.   Assessment / Plan / Recommendation Clinical Impression  Pt demosntrates no signs of dysphagia or aspiration. He denies any history of acid reflux or any trouble eating or drinking. Recommend pt continue current diet, no SLP f/u needed will sign off.  SLP Visit Diagnosis: Dysphagia, unspecified (R13.10)    Aspiration Risk  Mild aspiration risk    Diet Recommendation Regular;Thin liquid   Liquid Administration via: Cup;Straw Medication Administration: Whole meds with liquid Supervision: Patient able to self feed    Other  Recommendations     Follow up Recommendations None      Frequency and Duration            Prognosis        Swallow Study   General HPI: Robert Molina is a 69 y.o. male with no significant past medical history with chronic tobacco and alcohol abuse was found to be increasingly confused by patient's neighbors and EMS was alerted and was brought to the ER.  Patient states he has been having hiccups for the last few days denies any chest pain shortness of breath headache nausea vomiting or diarrhea.  Denies any productive cough.  Admits to drinking alcohol about 2 beers every day, in the ER he was diagnosed with COVID-19 pneumonia along with signs of bacterial pneumonia versus chronic aspiration.  CT chest with contrast shows bilateral consolidation with mucous plugging and postobstructive pneumonia features. Prominent lower paratracheal node measures 9 mm. Patulous upper esophagus with wall thickening distally, can be seen with reflux or esophagitis. Type of Study: Bedside Swallow Evaluation Previous Swallow Assessment: none Diet Prior to this Study: Regular;Thin liquids Temperature Spikes Noted: No Respiratory Status: Nasal cannula History of Recent Intubation: No Behavior/Cognition: Alert;Cooperative;Pleasant mood Oral Cavity Assessment: Within Functional Limits Oral Care Completed by SLP: No Oral Cavity - Dentition: Adequate natural dentition Vision: Functional for self-feeding Self-Feeding Abilities: Able to feed self Patient Positioning: Upright in bed Baseline Vocal Quality:  Normal Volitional Cough: Strong Volitional Swallow: Able to elicit    Oral/Motor/Sensory Function Overall Oral Motor/Sensory Function: Within functional limits   Ice Chips     Thin Liquid Thin  Liquid: Within functional limits Presentation: Cup;Straw;Self Fed    Nectar Thick Nectar Thick Liquid: Not tested   Honey Thick Honey Thick Liquid: Not tested   Puree Puree: Within functional limits   Solid     Solid: Within functional limits     Robert Baltimore, MA CCC-SLP  Acute Rehabilitation Services Pager [phone] Office [phone]  Robert Molina 08/07/2019,3:28 PM

## 2019-08-07 NOTE — Plan of Care (Signed)
  Problem: Respiratory: Goal: Will maintain a patent airway Outcome: Progressing Goal: Complications related to the disease process, condition or treatment will be avoided or minimized Outcome: Progressing   Problem: Coping: Goal: Level of anxiety will decrease Outcome: Not Progressing    Patient continuous anxiety with tremors, signs and symptpms of withdrawal noted, patient not aware of his surrounding CIWA protocol initiated per order at 2100.

## 2019-08-08 LAB — COMPREHENSIVE METABOLIC PANEL
ALT: 15 U/L (ref 0–44)
AST: 19 U/L (ref 15–41)
Albumin: 2.4 g/dL — ABNORMAL LOW (ref 3.5–5.0)
Alkaline Phosphatase: 37 U/L — ABNORMAL LOW (ref 38–126)
Anion gap: 10 (ref 5–15)
BUN: 8 mg/dL (ref 8–23)
CO2: 25 mmol/L (ref 22–32)
Calcium: 8.4 mg/dL — ABNORMAL LOW (ref 8.9–10.3)
Chloride: 101 mmol/L (ref 98–111)
Creatinine, Ser: 0.77 mg/dL (ref 0.61–1.24)
GFR calc Af Amer: >60 mL/min (ref >60)
GFR calc non Af Amer: >60 mL/min (ref >60)
Glucose, Bld: 114 mg/dL — ABNORMAL HIGH (ref 70–99)
Potassium: 4.3 mmol/L (ref 3.5–5.1)
Sodium: 136 mmol/L (ref 135–145)
Total Bilirubin: 0.6 mg/dL (ref 0.3–1.2)
Total Protein: 6 g/dL — ABNORMAL LOW (ref 6.5–8.1)

## 2019-08-08 LAB — CBC WITH DIFFERENTIAL/PLATELET
Abs Immature Granulocytes: 0.12 10*3/uL — ABNORMAL HIGH (ref 0.00–0.07)
Basophils Absolute: 0 10*3/uL (ref 0.0–0.1)
Basophils Relative: 0 %
Eosinophils Absolute: 0 10*3/uL (ref 0.0–0.5)
Eosinophils Relative: 0 %
HCT: 39.9 % (ref 39.0–52.0)
Hemoglobin: 13.4 g/dL (ref 13.0–17.0)
Immature Granulocytes: 2 %
Lymphocytes Relative: 10 %
Lymphs Abs: 0.7 10*3/uL (ref 0.7–4.0)
MCH: 30 pg (ref 26.0–34.0)
MCHC: 33.6 g/dL (ref 30.0–36.0)
MCV: 89.5 fL (ref 80.0–100.0)
Monocytes Absolute: 1 10*3/uL (ref 0.1–1.0)
Monocytes Relative: 14 %
Neutro Abs: 5.6 10*3/uL (ref 1.7–7.7)
Neutrophils Relative %: 74 %
Platelets: 325 10*3/uL (ref 150–400)
RBC: 4.46 MIL/uL (ref 4.22–5.81)
RDW: 15.1 % (ref 11.5–15.5)
WBC: 7.6 10*3/uL (ref 4.0–10.5)
nRBC: 0 % (ref 0.0–0.2)

## 2019-08-08 LAB — CULTURE, BLOOD (ROUTINE X 2): Special Requests: ADEQUATE

## 2019-08-08 LAB — PROCALCITONIN: Procalcitonin: 0.53 ng/mL

## 2019-08-08 LAB — C-REACTIVE PROTEIN: CRP: 4 mg/dL — ABNORMAL HIGH (ref <1.0)

## 2019-08-08 LAB — BRAIN NATRIURETIC PEPTIDE: B Natriuretic Peptide: 193.1 pg/mL — ABNORMAL HIGH (ref 0.0–100.0)

## 2019-08-08 LAB — MAGNESIUM: Magnesium: 1.9 mg/dL (ref 1.7–2.4)

## 2019-08-08 LAB — D-DIMER, QUANTITATIVE: D-Dimer, Quant: 1.73 ug/mL-FEU — ABNORMAL HIGH (ref 0.00–0.50)

## 2019-08-08 MED ORDER — DEXAMETHASONE SODIUM PHOSPHATE 4 MG/ML IJ SOLN
4.0000 mg | INTRAMUSCULAR | Status: DC
  Administered 2019-08-09: 4 mg via INTRAVENOUS
  Filled 2019-08-08: qty 1

## 2019-08-08 NOTE — Evaluation (Signed)
Physical Therapy Evaluation Patient Details Name: Robert Molina MRN: [id number] DOB: [birth year] Today's Date: 08/08/2019   History of Present Illness  69 y.o. male with no significant past medical history with chronic tobacco and alcohol abuse was found to be increasingly confused by patient's neighbors and EMS was alerted and was brought to the ER. Found to be COVID positive and admitted 08/05/19 for COVID PNA as well as possible aspiration PNA.   Clinical Impression  PTA pt living with girlfriend in second story apartment with 16 steps to enter. Pt reports independence in ambulation, ADLs and iADLs. Pt is limited in safe mobility by poor safety awareness, poor insight into his deficits in presence of decreased strength, balance and endurance. Pt is min A for bed mobility, transfers and ambulation of 20 feet without AD but use of furniture in room to steady. Pt with increased instability with ambulation across open areas. Pt reports his girlfriend is not COVID positive, so she would not be able to assist. PT recommending SNF level rehab due to second story apartment and decreased caregiver support. PT will continue to follow acutely.    Follow Up Recommendations SNF;Supervision/Assistance - 24 hour    Equipment Recommendations  None recommended by PT       Precautions / Restrictions Precautions Precautions: Fall Restrictions Weight Bearing Restrictions: No      Mobility  Bed Mobility Overal bed mobility: Needs Assistance Bed Mobility: Supine to Sit;Sit to Supine     Supine to sit: Min assist Sit to supine: Min guard   General bed mobility comments: min A for scooting hips to EoB, heavy use of bedrail to pull to upright, with sitting down on bed pt with hips very close to EoB, increased effot to get LE back into bed  Transfers Overall transfer level: Needs assistance Equipment used: None Transfers: Sit to/from Stand Sit to Stand: Min assist         General transfer  comment: good power up, min A required for steadying in standing   Ambulation/Gait Ambulation/Gait assistance: Min assist Gait Distance (Feet): 20 Feet Assistive device: None Gait Pattern/deviations: Step-through pattern;Decreased step length - right;Decreased step length - left;Shuffle;Narrow base of support Gait velocity: slowed Gait velocity interpretation: <1.31 ft/sec, indicative of household ambulator General Gait Details: min A for steadying, pt reaching out for furniture in room to self steady, refuses RW despite multiple suggestions to use an AD,      Balance Overall balance assessment: Needs assistance Sitting-balance support: Feet supported;No upper extremity supported Sitting balance-Leahy Scale: Fair     Standing balance support: No upper extremity supported;During functional activity Standing balance-Leahy Scale: Poor Standing balance comment: requires min A for steadying after standing and reaches out for support with ambulation                              Pertinent Vitals/Pain Pain Assessment: No/denies pain    Home Living Family/patient expects to be discharged to:: Private residence Living Arrangements: Spouse/significant other Available Help at Discharge: Family;Available PRN/intermittently Type of Home: Apartment Home Access: Stairs to enter Entrance Stairs-Rails: Right;Left Entrance Stairs-Number of Steps: 16 Home Layout: One level Home Equipment: Grab bars - tub/shower;Shower seat - built in;Hand held shower head      Prior Function Level of Independence: Independent                  Extremity/Trunk Assessment   Upper Extremity Assessment Upper Extremity Assessment: Generalized  weakness    Lower Extremity Assessment Lower Extremity Assessment: Generalized weakness       Communication   Communication: No difficulties  Cognition Arousal/Alertness: Awake/alert Behavior During Therapy: Agitated(upset about being woken up  ) Overall Cognitive Status: Impaired/Different from baseline Area of Impairment: Safety/judgement;Problem solving                         Safety/Judgement: Decreased awareness of safety;Decreased awareness of deficits   Problem Solving: Difficulty sequencing;Slow processing;Requires verbal cues;Requires tactile cues General Comments: pt asleep on entry and agitated about working with therapy, eventually ambulated with therapy, noted his own unsteadiness but refused to utilize [employer] Comments General comments (skin integrity, edema, etc.): VSS on RA        Assessment/Plan    PT Assessment Patient needs continued PT services  PT Problem List Decreased activity tolerance;Decreased balance;Decreased mobility;Decreased coordination;Decreased safety awareness       PT Treatment Interventions DME instruction;Gait training;Functional mobility training;Therapeutic activities;Therapeutic exercise;Balance training;Cognitive remediation;Patient/family education    PT Goals (Current goals can be found in the Care Plan section)  Acute Rehab PT Goals Patient Stated Goal: get some rest PT Goal Formulation: With patient Time For Goal Achievement: 08/22/19 Potential to Achieve Goals: Good    Frequency Min 2X/week   Barriers to discharge Inaccessible home environment         AM-PAC PT "6 Clicks" Mobility  Outcome Measure Help needed turning from your back to your side while in a flat bed without using bedrails?: None Help needed moving from lying on your back to sitting on the side of a flat bed without using bedrails?: A Little Help needed moving to and from a bed to a chair (including a wheelchair)?: A Little Help needed standing up from a chair using your arms (e.g., wheelchair or bedside chair)?: A Little Help needed to walk in hospital room?: A Little Help needed climbing 3-5 steps with a railing? : A Lot 6 Click Score: 18    End of Session   Activity  Tolerance: Patient tolerated treatment well Patient left: in bed;with call bell/phone within reach;with bed alarm set Nurse Communication: Mobility status PT Visit Diagnosis: Unsteadiness on feet (R26.81);Other abnormalities of gait and mobility (R26.89);Muscle weakness (generalized) (M62.81);Difficulty in walking, not elsewhere classified (R26.2)    Time: [id number] PT Time Calculation (min) (ACUTE ONLY): 24 min   Charges:   PT Evaluation $PT Eval Moderate Complexity: 1 Mod PT Treatments $Gait Training: 8-22 mins        Kinser Fellman B. Migdalia Dk PT, DPT Acute Rehabilitation Services Pager [phone] Office [phone]   [place] 08/08/2019, 1:17 PM

## 2019-08-08 NOTE — Progress Notes (Signed)
PROGRESS NOTE                                                                                                                                                                                                             Patient Demographics:    Robert Molina, is a 69 y.o. male, DOB - 26-Oct-1950, JSR:[id number]  Outpatient Primary MD for the patient is Patient, No Pcp Per    LOS - 3  Admit date - 08/05/2019    Chief Complaint  Patient presents with   Altered Mental Status       Brief Narrative - Robert Molina is a 69 y.o. male with no significant past medical history with chronic tobacco and alcohol abuse was found to be increasingly confused by patient's neighbors and EMS was alerted and was brought to the ER.  Patient states he has been having hiccups for the last few days denies any chest pain shortness of breath headache nausea vomiting or diarrhea.  Denies any productive cough.  Admits to drinking alcohol about 2 beers every day, in the ER he was diagnosed with COVID-19 pneumonia along with signs of bacterial pneumonia versus chronic aspiration.  Admitted to the hospital.   Subjective:   Patient in bed, appears comfortable, denies any headache, no fever, no chest pain or pressure, no shortness of breath , no abdominal pain. No focal weakness.   Assessment  & Plan :     1. Acute Hypoxic Resp. Failure due to Acute Covid 19 Viral Pneumonitis during the ongoing 2020 Covid 19 Pandemic with possible aspiration bacterial pneumonia - he has mild to moderate disease, suspicion for having both COVID-19 pneumonia along with possible aspiration pneumonia.  New combination of steroids, remdesivir and IV antibiotics.  Will have speech evaluate and continue aspiration precautions, flutter valve and I-S added for pulmonary toiletry.  Cleared by speech, much improved likely home in the next 1 to 2 days.  Encouraged the patient to sit up  in chair in the daytime use I-S and flutter valve for pulmonary toiletry and then prone in bed when at night.  Will advance activity and titrate down oxygen as possible.    Recent Labs  Lab 08/05/19 2118 08/06/19 0242 08/06/19 0735 08/07/19 0514 08/08/19 0700  CRP  --  11.1* 12.2* 9.7* 4.0*  DDIMER  --  2.89* 2.88*  2.14* 1.73*  BNP  --   --  134.8* 186.4* 193.1*  PROCALCITON  --  1.14  --  1.07 0.53  SARSCOV2NAA POSITIVE*  --   --   --   --     Hepatic Function Latest Ref Rng & Units 08/08/2019 08/07/2019 08/06/2019  Total Protein 6.5 - 8.1 g/dL 6.0(L) 6.3(L) 6.5  Albumin 3.5 - 5.0 g/dL 2.4(L) 2.6(L) 2.7(L)  AST 15 - 41 U/L 19 33 30  ALT 0 - 44 U/L 15 16 20   Alk Phosphatase 38 - 126 U/L 37(L) 36(L) 37(L)  Total Bilirubin 0.3 - 1.2 mg/dL 0.6 1.0 0.5  Bilirubin, Direct 0.0 - 0.2 mg/dL - - 0.1    2.  Ongoing hiccups.  Thorazine added.  If they continue CT abdomen pelvis will be done to rule out any issues with his diaphragm.  3.  Alcohol intake.  Denies any abuse.  Monitor on CIWA protocol.  On folic acid and thiamine.  4.  Generalized weakness.  PT OT consult and monitor.  5.  Dehydration with hyponatremia.  Improving with IV fluids continue.  6.  Possible esophagitis.  PPI with outpatient GI follow-up.  7.  Urinary retention.  Placed on Flomax, continues to retain, Foley will be placed on 08/07/2019.  Outpatient urology follow-up post discharge.    Condition - Fair  Family Communication  Annia Friendly [phone] on 08/06/2019 at 11 AM.  No voicemail set.  Code Status :  Full  Diet :   Diet Order            Diet regular Room service appropriate? Yes; Fluid consistency: Thin  Diet effective now               Disposition Plan  :    Status is: Inpatient  Remains inpatient appropriate because:IV treatments appropriate due to intensity of illness or inability to take PO   Dispo: The patient is from: Home              Anticipated d/c is to: Home               Anticipated d/c date is: > 3 days              Patient currently is not medically stable to d/c.  Consults  :  None  Procedures  :    CT - 1. Bronchial thickening with areas of mucous plugging. Patchy consolidation in the right middle and right lower lobe in the areas of mucous plugging which may be postobstructive atelectasis/pneumonia. 2. Dense consolidation in the left lower lobe posteriorly, with likely significant mucous plugging in this region, however motion artifact limits detailed assessment. Favor pneumonia. Recommend radiographic follow-up. 3. Patulous upper esophagus with wall thickening distally, can be seen with reflux or esophagitis. 4. Emphysema. 5. Coronary artery calcifications. 6. A 9 mm low-density lesion in the posterior right kidney is too small to accurately characterize, likely a minimally complex cyst. Aortic Atherosclerosis (ICD10-I70.0) and Emphysema  PUD Prophylaxis : PPI  DVT Prophylaxis  :  Lovenox   Lab Results  Component Value Date   PLT 325 08/08/2019    Inpatient Medications  Scheduled Meds:  Chlorhexidine Gluconate Cloth  6 each Topical Daily   dexamethasone (DECADRON) injection  6 mg Intravenous Q24H   enoxaparin (LOVENOX) injection  40 mg Subcutaneous Daily   folic acid  1 mg Oral Daily   multivitamin with minerals  1 tablet Oral Daily   pantoprazole  40 mg Oral Daily   tamsulosin  0.4 mg Oral Daily   thiamine  100 mg Oral Daily   Or   thiamine  100 mg Intravenous Daily   Continuous Infusions:  azithromycin 500 mg (08/08/19 0845)   cefTRIAXone (ROCEPHIN)  IV 2 g (08/07/19 2109)   chlorproMAZINE (THORAZINE) IV     remdesivir 100 mg in NS 100 mL 100 mg (08/07/19 1001)   PRN Meds:.chlorproMAZINE (THORAZINE) IV, LORazepam **OR** LORazepam, [DISCONTINUED] ondansetron **OR** ondansetron (ZOFRAN) IV  Antibiotics  :    Anti-infectives (From admission, onward)   Start     Dose/Rate Route Frequency Ordered Stop   08/07/19 1000   remdesivir 100 mg in sodium chloride 0.9 % 100 mL IVPB     100 mg 200 mL/hr over 30 Minutes Intravenous Daily 08/06/19 0009 08/11/19 0959   08/06/19 2200  azithromycin (ZITHROMAX) 500 mg in sodium chloride 0.9 % 250 mL IVPB  Status:  Discontinued     500 mg 250 mL/hr over 60 Minutes Intravenous Every 24 hours 08/06/19 0636 08/06/19 0734   08/06/19 2100  cefTRIAXone (ROCEPHIN) 2 g in sodium chloride 0.9 % 100 mL IVPB     2 g 200 mL/hr over 30 Minutes Intravenous Every 24 hours 08/06/19 0636 08/11/19 2059   08/06/19 0800  azithromycin (ZITHROMAX) 500 mg in sodium chloride 0.9 % 250 mL IVPB     500 mg 250 mL/hr over 60 Minutes Intravenous Every 24 hours 08/06/19 0737     08/06/19 0100  remdesivir 200 mg in sodium chloride 0.9% 250 mL IVPB     200 mg 580 mL/hr over 30 Minutes Intravenous Once 08/06/19 0009 08/06/19 0241   08/05/19 2100  cefTRIAXone (ROCEPHIN) 1 g in sodium chloride 0.9 % 100 mL IVPB     1 g 200 mL/hr over 30 Minutes Intravenous  Once 08/05/19 2034 08/05/19 2150   08/05/19 2100  azithromycin (ZITHROMAX) 500 mg in sodium chloride 0.9 % 250 mL IVPB     500 mg 250 mL/hr over 60 Minutes Intravenous  Once 08/05/19 2034 08/05/19 2314       Time Spent in minutes  30   Lala Lund M.D on 08/08/2019 at 11:31 AM  To page go to [email or web address] - password [facility]  [facility] -  Office  [phone]     See all Orders from today for further details    Objective:   Vitals:   08/07/19 1300 08/07/19 1351 08/07/19 2201 08/08/19 0500  BP: 128/90 110/90 (!) 147/92 108/64  Pulse:  87 65 62  Resp:  15 18 16   Temp:  98.1 F (36.7 C) 98.5 F (36.9 C) 98.6 F (37 C)  TempSrc:  Oral Oral Oral  SpO2:  93% 98% 93%  Weight:      Height:        Wt Readings from Last 3 Encounters:  08/06/19 61.6 kg  05/06/13 64.4 kg  04/30/13 64.4 kg     Intake/Output Summary (Last 24 hours) at 08/08/2019 1131 Last data filed at 08/08/2019 0900 Gross per 24 hour  Intake 570 ml  Output  1700 ml  Net -1130 ml     Physical Exam  Awake Alert, No new F.N deficits, Normal affect [place].AT,PERRAL Supple Neck,No JVD, No cervical lymphadenopathy appriciated.  Symmetrical Chest wall movement, Good air movement bilaterally, CTAB RRR,No Gallops, Rubs or new Murmurs, No Parasternal Heave +ve B.Sounds, Abd Soft, No tenderness, No organomegaly appriciated, No rebound - guarding or rigidity. Foley in place No  Cyanosis, Clubbing or edema, No new Rash or bruise    Data Review:    CBC Recent Labs  Lab 08/05/19 2020 08/06/19 0242 08/06/19 0735 08/07/19 0514 08/08/19 0700  WBC 5.5 5.0 4.9 6.6 7.6  HGB 13.6 13.2 14.2 15.0 13.4  HCT 39.4 38.9* 41.6 44.5 39.9  PLT 213 204 219 241 325  MCV 89.3 89.2 88.5 91.2 89.5  MCH 30.8 30.3 30.2 30.7 30.0  MCHC 34.5 33.9 34.1 33.7 33.6  RDW 14.6 14.7 14.6 15.1 15.1  LYMPHSABS 0.5*  --   --  0.6* 0.7  MONOABS 1.6*  --   --  1.2* 1.0  EOSABS 0.0  --   --  0.0 0.0  BASOSABS 0.0  --   --  0.0 0.0    Chemistries  Recent Labs  Lab 08/05/19 2020 08/06/19 0242 08/06/19 0704 08/06/19 0735 08/06/19 1633 08/07/19 0043 08/07/19 0514 08/08/19 0700  NA 123*  --  128*  --  130* 131* 131* 136  K 4.1  --  4.3  --  4.4 4.1 4.4 4.3  CL 89*  --  93*  --  93* 97* 97* 101  CO2 23  --  24  --  25 23 25 25   GLUCOSE 114*  --  153*  --  147* 151* 142* 114*  BUN 10  --  9  --  12 13 13 8   CREATININE 0.92   < > 0.85  --  0.87 0.79 0.81 0.77  CALCIUM 8.4*  --  8.5*  --  8.5* 8.4* 8.5* 8.4*  AST 36  --  30  --   --   --  33 19  ALT 21  --  20  --   --   --  16 15  ALKPHOS 41  --  37*  --   --   --  36* 37*  BILITOT 1.0  --  0.5  --   --   --  1.0 0.6  MG  --   --  1.9 1.9  --   --  2.0 1.9  AMMONIA  --   --  25  --   --   --   --   --   TSH  --   --  0.168*  --   --   --   --   --    < > = values in this interval not displayed.     ------------------------------------------------------------------------------------------------------------------ No  results for input(s): CHOL, HDL, LDLCALC, TRIG, CHOLHDL, LDLDIRECT in the last 72 hours.  No results found for: HGBA1C ------------------------------------------------------------------------------------------------------------------ Recent Labs    08/06/19 0704  TSH 0.168*    Cardiac Enzymes No results for input(s): CKMB, TROPONINI, MYOGLOBIN in the last 168 hours.  Invalid input(s): CK ------------------------------------------------------------------------------------------------------------------    Component Value Date/Time   BNP 193.1 (H) 08/08/2019 0700    Micro Results Recent Results (from the past 240 hour(s))  Respiratory Panel by RT PCR (Flu A&B, Covid) - Nasopharyngeal Swab     Status: Abnormal   Collection Time: 08/05/19  9:18 PM   Specimen: Nasopharyngeal Swab  Result Value Ref Range Status   SARS Coronavirus 2 by RT PCR POSITIVE (A) NEGATIVE Final    Comment: RESULT CALLED TO, READ BACK BY AND VERIFIED WITH: O RAGNER RN 08/05/19 2319 JDW (NOTE) SARS-CoV-2 target nucleic acids are DETECTED. SARS-CoV-2 RNA is generally detectable in upper respiratory specimens  during the acute phase of infection. Positive results are indicative of  the presence of the identified virus, but do not rule out bacterial infection or co-infection with other pathogens not detected by the test. Clinical correlation with patient history and other diagnostic information is necessary to determine patient infection status. The expected result is Negative. Fact Sheet for Patients:  PinkCheek.be Fact Sheet for Healthcare Providers: GravelBags.it This test is not yet approved or cleared by the [place] FDA and  has been authorized for detection and/or diagnosis of SARS-CoV-2 by FDA under an Emergency Use Authorization (EUA).  This EUA will remain in effect (meaning this test can be used) for the  duration of  the COVID-19  declaration under Section 564(b)(1) of the Act, 21 U.S.C. section 360bbb-3(b)(1), unless the authorization is terminated or revoked sooner.    Influenza A by PCR NEGATIVE NEGATIVE Final   Influenza B by PCR NEGATIVE NEGATIVE Final    Comment: (NOTE) The Xpert Xpress SARS-CoV-2/FLU/RSV assay is intended as an aid in  the diagnosis of influenza from Nasopharyngeal swab specimens and  should not be used as a sole basis for treatment. Nasal washings and  aspirates are unacceptable for Xpert Xpress SARS-CoV-2/FLU/RSV  testing. Fact Sheet for Patients: PinkCheek.be Fact Sheet for Healthcare Providers: GravelBags.it This test is not yet approved or cleared by the [place] FDA and  has been authorized for detection and/or diagnosis of SARS-CoV-2 by  FDA under an Emergency Use Authorization (EUA). This EUA will remain  in effect (meaning this test can be used) for the duration of the  Covid-19 declaration under Section 564(b)(1) of the Act, 21  U.S.C. section 360bbb-3(b)(1), unless the authorization is  terminated or revoked. Performed at [facility], [place] [address], [place], [place] [address]   Culture, blood (routine x 2)     Status: Abnormal   Collection Time: 08/05/19  9:32 PM   Specimen: BLOOD RIGHT FOREARM  Result Value Ref Range Status   Specimen Description BLOOD RIGHT FOREARM  Final   Special Requests   Final    BOTTLES DRAWN AEROBIC AND ANAEROBIC Blood Culture adequate volume   Culture  Setup Time   Final    ANAEROBIC BOTTLE ONLY GRAM POSITIVE COCCI IN CLUSTERS CRITICAL RESULT CALLED TO, READ BACK BY AND VERIFIED WITH: J MILLEN PHARMD 08/06/19 1956 JDW    Culture (A)  Final    STAPHYLOCOCCUS SPECIES (COAGULASE NEGATIVE) THE SIGNIFICANCE OF ISOLATING THIS ORGANISM FROM A SINGLE SET OF BLOOD CULTURES WHEN MULTIPLE SETS ARE DRAWN IS UNCERTAIN. PLEASE NOTIFY THE MICROBIOLOGY DEPARTMENT WITHIN ONE WEEK IF  SPECIATION AND SENSITIVITIES ARE REQUIRED. Performed at [facility], [place] [address], [place], [place] [address]    Report Status 08/08/2019 FINAL  Final  Blood Culture ID Panel (Reflexed)     Status: Abnormal   Collection Time: 08/05/19  9:32 PM  Result Value Ref Range Status   Enterococcus species NOT DETECTED NOT DETECTED Final   Listeria monocytogenes NOT DETECTED NOT DETECTED Final   Staphylococcus species DETECTED (A) NOT DETECTED Final    Comment: Methicillin (oxacillin) susceptible coagulase negative staphylococcus. Possible blood culture contaminant (unless isolated from more than one blood culture draw or clinical case suggests pathogenicity). No antibiotic treatment is indicated for blood  culture contaminants. CRITICAL RESULT CALLED TO, READ BACK BY AND VERIFIED WITH: Dion Body [facility] 08/06/19 1956 JDW    Staphylococcus aureus (BCID) NOT DETECTED NOT DETECTED Final   Methicillin resistance NOT DETECTED NOT DETECTED Final   Streptococcus species NOT DETECTED NOT DETECTED Final  Streptococcus agalactiae NOT DETECTED NOT DETECTED Final   Streptococcus pneumoniae NOT DETECTED NOT DETECTED Final   Streptococcus pyogenes NOT DETECTED NOT DETECTED Final   Acinetobacter baumannii NOT DETECTED NOT DETECTED Final   Enterobacteriaceae species NOT DETECTED NOT DETECTED Final   Enterobacter cloacae complex NOT DETECTED NOT DETECTED Final   Escherichia coli NOT DETECTED NOT DETECTED Final   Klebsiella oxytoca NOT DETECTED NOT DETECTED Final   Klebsiella pneumoniae NOT DETECTED NOT DETECTED Final   Proteus species NOT DETECTED NOT DETECTED Final   Serratia marcescens NOT DETECTED NOT DETECTED Final   Haemophilus influenzae NOT DETECTED NOT DETECTED Final   Neisseria meningitidis NOT DETECTED NOT DETECTED Final   Pseudomonas aeruginosa NOT DETECTED NOT DETECTED Final   Candida albicans NOT DETECTED NOT DETECTED Final   Candida glabrata NOT DETECTED NOT DETECTED Final   Candida  krusei NOT DETECTED NOT DETECTED Final   Candida parapsilosis NOT DETECTED NOT DETECTED Final   Candida tropicalis NOT DETECTED NOT DETECTED Final    Comment: Performed at [facility], [place] [address], [place], [place] [address]  Culture, blood (routine x 2)     Status: None (Preliminary result)   Collection Time: 08/05/19  9:33 PM   Specimen: BLOOD LEFT FOREARM  Result Value Ref Range Status   Specimen Description BLOOD LEFT FOREARM  Final   Special Requests   Final    BOTTLES DRAWN AEROBIC AND ANAEROBIC Blood Culture adequate volume   Culture   Final    NO GROWTH 3 DAYS Performed at [facility], [place] [address] [place], [place] [address]    Report Status PENDING  Incomplete    Radiology Reports CT Chest W Contrast  Result Date: 08/05/2019 CLINICAL DATA:  Shortness of breath. Hiccups. Fever. EXAM: CT CHEST WITH CONTRAST TECHNIQUE: Multidetector CT imaging of the chest was performed during intravenous contrast administration. CONTRAST:  49m OMNIPAQUE IOHEXOL 300 MG/ML  SOLN COMPARISON:  Radiograph earlier this day. FINDINGS: Cardiovascular: Aortic atherosclerosis. No dissection. No filling defects in the central most pulmonary arteries to the proximal lobar level. Heart is normal in size. There are coronary artery calcifications. No pericardial effusion. Mediastinum/Nodes: Prominent lower paratracheal node measures 9 mm. No hilar adenopathy. Patulous upper esophagus with wall thickening distally. Lower most chest including the gastroesophageal junction are obscured by motion artifact. Lungs/Pleura: Moderate apical predominant emphysema. Bronchial thickening in the mid lower lung zones with areas of mucous plugging involving the right middle and both lower lobes. Breathing motion artifact significantly limits assessment of the lung bases. There is patchy airspace disease in the right middle lobe and lower lobes. Patchy and consolidative opacity at the left lung base, with  significant mucous plugging in this region. No significant pleural effusion small perifissural area of scarring in the right upper lobe. Minimal retained mucus in the dependent trachea. There is a pleural based 7 mm left lower lobe nodule, series 4, image 137. Upper Abdomen: 9 mm low-density indeterminate lesion in the posterior right kidney. Hepatic steatosis. Motion artifact in the upper abdomen. Musculoskeletal: There are no acute or suspicious osseous abnormalities. IMPRESSION: 1. Bronchial thickening with areas of mucous plugging. Patchy consolidation in the right middle and right lower lobe in the areas of mucous plugging which may be postobstructive atelectasis/pneumonia. 2. Dense consolidation in the left lower lobe posteriorly, with likely significant mucous plugging in this region, however motion artifact limits detailed assessment. Favor pneumonia. Recommend radiographic follow-up. 3. Patulous upper esophagus with wall thickening distally, can be  seen with reflux or esophagitis. 4. Emphysema. 5. Coronary artery calcifications. 6. A 9 mm low-density lesion in the posterior right kidney is too small to accurately characterize, likely a minimally complex cyst. Aortic Atherosclerosis (ICD10-I70.0) and Emphysema (ICD10-J43.9). Electronically Signed   By: Keith Rake M.D.   On: 08/05/2019 23:17   DG Chest Port 1 View  Result Date: 08/07/2019 CLINICAL DATA:  Shortness of breath, COVID EXAM: PORTABLE CHEST 1 VIEW COMPARISON:  08/06/2019 FINDINGS: The heart size and mediastinal contours are within normal limits. Both lungs are clear. The visualized skeletal structures are unremarkable. IMPRESSION: No acute abnormality of the lungs in AP portable projection. Electronically Signed   By: Eddie Candle M.D.   On: 08/07/2019 08:55   DG Chest Port 1 View  Result Date: 08/06/2019 CLINICAL DATA:  Shortness of breath. EXAM: PORTABLE CHEST 1 VIEW COMPARISON:  08/05/2019 and CT 08/05/2018 FINDINGS: Lungs are  adequately inflated demonstrate stable hazy opacification over the left base/retrocardiac region and medial right base. No definite effusion. Cardiomediastinal silhouette and remainder the exam is unchanged. IMPRESSION: Persistent mild bibasilar opacification most prominent in the left base/retrocardiac region compatible recent CT findings suggesting pneumonia. Electronically Signed   By: Marin Olp M.D.   On: 08/06/2019 08:20   DG Chest Portable 1 View  Result Date: 08/05/2019 CLINICAL DATA:  Fever. EXAM: PORTABLE CHEST 1 VIEW COMPARISON:  Chest radiograph 04/10/2013 FINDINGS: Monitoring leads overlie the patient. Normal cardiac and mediastinal contours. Bibasilar heterogeneous opacities, left greater than right. No pleural effusion or pneumothorax. IMPRESSION: Left-greater-than-right basilar heterogeneous opacities may represent pneumonia in the appropriate clinical setting. Followup PA and lateral chest X-ray is recommended in 3-4 weeks following trial of antibiotic therapy to ensure resolution and exclude underlying malignancy. Electronically Signed   By: Lovey Newcomer M.D.   On: 08/05/2019 20:28

## 2019-08-09 LAB — COMPREHENSIVE METABOLIC PANEL
ALT: 15 U/L (ref 0–44)
AST: 19 U/L (ref 15–41)
Albumin: 2.3 g/dL — ABNORMAL LOW (ref 3.5–5.0)
Alkaline Phosphatase: 36 U/L — ABNORMAL LOW (ref 38–126)
Anion gap: 6 (ref 5–15)
BUN: 8 mg/dL (ref 8–23)
CO2: 30 mmol/L (ref 22–32)
Calcium: 8.5 mg/dL — ABNORMAL LOW (ref 8.9–10.3)
Chloride: 100 mmol/L (ref 98–111)
Creatinine, Ser: 0.86 mg/dL (ref 0.61–1.24)
GFR calc Af Amer: >60 mL/min (ref >60)
GFR calc non Af Amer: >60 mL/min (ref >60)
Glucose, Bld: 88 mg/dL (ref 70–99)
Potassium: 3.4 mmol/L — ABNORMAL LOW (ref 3.5–5.1)
Sodium: 136 mmol/L (ref 135–145)
Total Bilirubin: 0.5 mg/dL (ref 0.3–1.2)
Total Protein: 5.6 g/dL — ABNORMAL LOW (ref 6.5–8.1)

## 2019-08-09 LAB — CBC WITH DIFFERENTIAL/PLATELET
Abs Immature Granulocytes: 0.05 10*3/uL (ref 0.00–0.07)
Basophils Absolute: 0 10*3/uL (ref 0.0–0.1)
Basophils Relative: 0 %
Eosinophils Absolute: 0 10*3/uL (ref 0.0–0.5)
Eosinophils Relative: 0 %
HCT: 39.7 % (ref 39.0–52.0)
Hemoglobin: 13.3 g/dL (ref 13.0–17.0)
Immature Granulocytes: 1 %
Lymphocytes Relative: 30 %
Lymphs Abs: 1.2 10*3/uL (ref 0.7–4.0)
MCH: 30 pg (ref 26.0–34.0)
MCHC: 33.5 g/dL (ref 30.0–36.0)
MCV: 89.4 fL (ref 80.0–100.0)
Monocytes Absolute: 0.7 10*3/uL (ref 0.1–1.0)
Monocytes Relative: 16 %
Neutro Abs: 2.2 10*3/uL (ref 1.7–7.7)
Neutrophils Relative %: 53 %
Platelets: 333 10*3/uL (ref 150–400)
RBC: 4.44 MIL/uL (ref 4.22–5.81)
RDW: 14.9 % (ref 11.5–15.5)
WBC: 4.1 10*3/uL (ref 4.0–10.5)
nRBC: 0 % (ref 0.0–0.2)

## 2019-08-09 LAB — C-REACTIVE PROTEIN: CRP: 2.5 mg/dL — ABNORMAL HIGH (ref <1.0)

## 2019-08-09 LAB — MAGNESIUM: Magnesium: 1.7 mg/dL (ref 1.7–2.4)

## 2019-08-09 LAB — D-DIMER, QUANTITATIVE: D-Dimer, Quant: 2.24 ug/mL-FEU — ABNORMAL HIGH (ref 0.00–0.50)

## 2019-08-09 LAB — GLUCOSE, CAPILLARY: Glucose-Capillary: 89 mg/dL (ref 70–99)

## 2019-08-09 LAB — CULTURE, BLOOD (ROUTINE X 2): Culture: NO GROWTH

## 2019-08-09 LAB — PROCALCITONIN: Procalcitonin: <0.1 ng/mL

## 2019-08-09 LAB — BRAIN NATRIURETIC PEPTIDE: B Natriuretic Peptide: 223.3 pg/mL — ABNORMAL HIGH (ref 0.0–100.0)

## 2019-08-09 MED ORDER — POTASSIUM CHLORIDE CRYS ER 20 MEQ PO TBCR
40.0000 meq | EXTENDED_RELEASE_TABLET | Freq: Once | ORAL | Status: AC
  Administered 2019-08-09: 40 meq via ORAL
  Filled 2019-08-09: qty 2

## 2019-08-09 NOTE — Progress Notes (Signed)
PROGRESS NOTE                                                                                                                                                                                                             Patient Demographics:    Robert Molina, is a 70 y.o. male, DOB - [birth year], JTT:[id number]  Outpatient Primary MD for the patient is Patient, No Pcp Per    LOS - 4  Admit date - 08/05/2019    Chief Complaint  Patient presents with  . Altered Mental Status       Brief Narrative - Robert Molina is a 69 y.o. male with no significant past medical history with chronic tobacco and alcohol abuse was found to be increasingly confused by patient's neighbors and EMS was alerted and was brought to the ER.  Patient states he has been having hiccups for the last few days denies any chest pain shortness of breath headache nausea vomiting or diarrhea.  Denies any productive cough.  Admits to drinking alcohol about 2 beers every day, in the ER he was diagnosed with COVID-19 pneumonia along with signs of bacterial pneumonia versus chronic aspiration.  Admitted to the hospital.   Subjective:   Patient in bed, appears comfortable, denies any headache, no fever, no chest pain or pressure, no shortness of breath , no abdominal pain. No focal weakness.    Assessment  & Plan :     1. Acute Hypoxic Resp. Failure due to Acute Covid 19 Viral Pneumonitis during the ongoing 2020 Covid 19 Pandemic with possible aspiration bacterial pneumonia - he has mild to moderate disease, suspicion for having both COVID-19 pneumonia along with possible aspiration pneumonia.  New combination of steroids, remdesivir and IV antibiotics.  Will have speech evaluate and continue aspiration precautions, flutter valve and I-S added for pulmonary toiletry.  Cleared by speech, much improved likely home in the next 1 to 2 days.  Encouraged the patient to sit  up in chair in the daytime use I-S and flutter valve for pulmonary toiletry and then prone in bed when at night.  Will advance activity and titrate down oxygen as possible.    Recent Labs  Lab 08/05/19 2118 08/06/19 0242 08/06/19 0735 08/07/19 0514 08/08/19 0700 08/09/19 0544  CRP  --  11.1* 12.2* 9.7* 4.0* 2.5*  DDIMER  --  2.89* 2.88* 2.14* 1.73* 2.24*  BNP  --   --  134.8* 186.4* 193.1* 223.3*  PROCALCITON  --  1.14  --  1.07 0.53 <0.10  SARSCOV2NAA POSITIVE*  --   --   --   --   --     Hepatic Function Latest Ref Rng & Units 08/09/2019 08/08/2019 08/07/2019  Total Protein 6.5 - 8.1 g/dL 5.6(L) 6.0(L) 6.3(L)  Albumin 3.5 - 5.0 g/dL 2.3(L) 2.4(L) 2.6(L)  AST 15 - 41 U/L 19 19 33  ALT 0 - 44 U/L 15 15 16   Alk Phosphatase 38 - 126 U/L 36(L) 37(L) 36(L)  Total Bilirubin 0.3 - 1.2 mg/dL 0.5 0.6 1.0  Bilirubin, Direct 0.0 - 0.2 mg/dL - - -    2.  Ongoing hiccups.  Thorazine added.  If they continue CT abdomen pelvis will be done to rule out any issues with his diaphragm.  3.  Alcohol intake.  Denies any abuse.  Monitor on CIWA protocol.  On folic acid and thiamine.  No signs of DTs.  4.  Generalized weakness.  PT OT consulted, recommended SNF which we will try if he agrees on his home health with PT.  Case management consulted on 08/09/2019.  5.  Dehydration with hyponatremia.  Improving with IV fluids continue.  6.  Possible esophagitis.  PPI with outpatient GI follow-up.  7.  Urinary retention.  Placed on Flomax, Foley placed for 2 days, will give a trial of removing Foley on 08/09/2019, monitor post void bladder scans.    Condition - Fair  Family Communication  Annia Friendly [phone] on 08/06/2019 at 11 AM.  No voicemail set.  Code Status :  Full  Diet :   Diet Order            Diet regular Room service appropriate? Yes; Fluid consistency: Thin  Diet effective now               Disposition Plan  :    Status is: Inpatient  Remains inpatient appropriate  because:IV treatments appropriate due to intensity of illness or inability to take PO   Dispo: The patient is from: Home              Anticipated d/c is to: Home/ SNF              Anticipated d/c date is: > 3 days              Patient currently is not medically stable to d/c.  Consults  :  None  Procedures  :    CT - 1. Bronchial thickening with areas of mucous plugging. Patchy consolidation in the right middle and right lower lobe in the areas of mucous plugging which may be postobstructive atelectasis/pneumonia. 2. Dense consolidation in the left lower lobe posteriorly, with likely significant mucous plugging in this region, however motion artifact limits detailed assessment. Favor pneumonia. Recommend radiographic follow-up. 3. Patulous upper esophagus with wall thickening distally, can be seen with reflux or esophagitis. 4. Emphysema. 5. Coronary artery calcifications. 6. A 9 mm low-density lesion in the posterior right kidney is too small to accurately characterize, likely a minimally complex cyst. Aortic Atherosclerosis (ICD10-I70.0) and Emphysema  PUD Prophylaxis : PPI  DVT Prophylaxis  :  Lovenox   Lab Results  Component Value Date   PLT 333 08/09/2019    Inpatient Medications  Scheduled Meds: . Chlorhexidine Gluconate Cloth  6 each Topical Daily  . dexamethasone (DECADRON) injection  4 mg Intravenous Q24H  . enoxaparin (LOVENOX) injection  40 mg Subcutaneous Daily  . folic acid  1 mg Oral Daily  . multivitamin with minerals  1 tablet Oral Daily  . pantoprazole  40 mg Oral Daily  . potassium chloride  40 mEq Oral Once  . tamsulosin  0.4 mg Oral Daily  . thiamine  100 mg Oral Daily   Continuous Infusions: . azithromycin 500 mg (08/09/19 0755)  . cefTRIAXone (ROCEPHIN)  IV 2 g (08/08/19 2137)  . chlorproMAZINE (THORAZINE) IV    . remdesivir 100 mg in NS 100 mL 100 mg (08/08/19 1204)   PRN Meds:.chlorproMAZINE (THORAZINE) IV, [DISCONTINUED] ondansetron **OR**  ondansetron (ZOFRAN) IV  Antibiotics  :    Anti-infectives (From admission, onward)   Start     Dose/Rate Route Frequency Ordered Stop   08/07/19 1000  remdesivir 100 mg in sodium chloride 0.9 % 100 mL IVPB     100 mg 200 mL/hr over 30 Minutes Intravenous Daily 08/06/19 0009 08/11/19 0959   08/06/19 2200  azithromycin (ZITHROMAX) 500 mg in sodium chloride 0.9 % 250 mL IVPB  Status:  Discontinued     500 mg 250 mL/hr over 60 Minutes Intravenous Every 24 hours 08/06/19 0636 08/06/19 0734   08/06/19 2100  cefTRIAXone (ROCEPHIN) 2 g in sodium chloride 0.9 % 100 mL IVPB     2 g 200 mL/hr over 30 Minutes Intravenous Every 24 hours 08/06/19 0636 08/11/19 2059   08/06/19 0800  azithromycin (ZITHROMAX) 500 mg in sodium chloride 0.9 % 250 mL IVPB     500 mg 250 mL/hr over 60 Minutes Intravenous Every 24 hours 08/06/19 0737 08/11/19 0759   08/06/19 0100  remdesivir 200 mg in sodium chloride 0.9% 250 mL IVPB     200 mg 580 mL/hr over 30 Minutes Intravenous Once 08/06/19 0009 08/06/19 0241   08/05/19 2100  cefTRIAXone (ROCEPHIN) 1 g in sodium chloride 0.9 % 100 mL IVPB     1 g 200 mL/hr over 30 Minutes Intravenous  Once 08/05/19 2034 08/05/19 2150   08/05/19 2100  azithromycin (ZITHROMAX) 500 mg in sodium chloride 0.9 % 250 mL IVPB     500 mg 250 mL/hr over 60 Minutes Intravenous  Once 08/05/19 2034 08/05/19 2314       Time Spent in minutes  30   Lala Lund M.D on 08/09/2019 at 10:13 AM  To page go to [email or web address] - password [email or web address]  [facility] -  Office  [phone]     See all Orders from today for further details    Objective:   Vitals:   08/08/19 2000 08/09/19 0007 08/09/19 0400 08/09/19 0625  BP: (!) 142/84 (!) 145/93 125/77 (!) 128/96  Pulse: 60 71 84 86  Resp: 17  20   Temp: 98.8 F (37.1 C)  98.7 F (37.1 C)   TempSrc: Oral  Oral   SpO2: 90%  96%   Weight:      Height:        Wt Readings from Last 3 Encounters:  08/06/19 61.6 kg  05/06/13 64.4 kg    04/30/13 64.4 kg     Intake/Output Summary (Last 24 hours) at 08/09/2019 1013 Last data filed at 08/09/2019 0529 Gross per 24 hour  Intake --  Output 1700 ml  Net -1700 ml     Physical Exam  Awake Alert, No new F.N deficits, Normal affect Ione.AT,PERRAL Supple Neck,No JVD, No cervical lymphadenopathy appriciated.  Symmetrical Chest wall movement, Good air  movement bilaterally, CTAB RRR,No Gallops, Rubs or new Murmurs, No Parasternal Heave +ve B.Sounds, Abd Soft, No tenderness, No organomegaly appriciated, No rebound - guarding or rigidity. No Cyanosis, Clubbing or edema, No new Rash or bruise    Data Review:    CBC Recent Labs  Lab 08/05/19 2020 08/05/19 2020 08/06/19 0242 08/06/19 0735 08/07/19 0514 08/08/19 0700 08/09/19 0544  WBC 5.5   < > 5.0 4.9 6.6 7.6 4.1  HGB 13.6   < > 13.2 14.2 15.0 13.4 13.3  HCT 39.4   < > 38.9* 41.6 44.5 39.9 39.7  PLT 213   < > 204 219 241 325 333  MCV 89.3   < > 89.2 88.5 91.2 89.5 89.4  MCH 30.8   < > 30.3 30.2 30.7 30.0 30.0  MCHC 34.5   < > 33.9 34.1 33.7 33.6 33.5  RDW 14.6   < > 14.7 14.6 15.1 15.1 14.9  LYMPHSABS 0.5*  --   --   --  0.6* 0.7 1.2  MONOABS 1.6*  --   --   --  1.2* 1.0 0.7  EOSABS 0.0  --   --   --  0.0 0.0 0.0  BASOSABS 0.0  --   --   --  0.0 0.0 0.0   < > = values in this interval not displayed.    Chemistries  Recent Labs  Lab 08/05/19 2020 08/06/19 0242 08/06/19 0704 08/06/19 0704 08/06/19 0735 08/06/19 1633 08/07/19 0043 08/07/19 0514 08/08/19 0700 08/09/19 0544  NA 123*  --  128*   < >  --  130* 131* 131* 136 136  K 4.1  --  4.3   < >  --  4.4 4.1 4.4 4.3 3.4*  CL 89*  --  93*   < >  --  93* 97* 97* 101 100  CO2 23  --  24   < >  --  25 23 25 25 30   GLUCOSE 114*  --  153*   < >  --  147* 151* 142* 114* 88  BUN 10  --  9   < >  --  12 13 13 8 8   CREATININE 0.92   < > 0.85   < >  --  0.87 0.79 0.81 0.77 0.86  CALCIUM 8.4*  --  8.5*   < >  --  8.5* 8.4* 8.5* 8.4* 8.5*  AST 36  --  30  --   --    --   --  33 19 19  ALT 21  --  20  --   --   --   --  16 15 15   ALKPHOS 41  --  37*  --   --   --   --  36* 37* 36*  BILITOT 1.0  --  0.5  --   --   --   --  1.0 0.6 0.5  MG  --   --  1.9  --  1.9  --   --  2.0 1.9 1.7  AMMONIA  --   --  25  --   --   --   --   --   --   --   TSH  --   --  0.168*  --   --   --   --   --   --   --    < > = values in this interval not displayed.     ------------------------------------------------------------------------------------------------------------------  No results for input(s): CHOL, HDL, LDLCALC, TRIG, CHOLHDL, LDLDIRECT in the last 72 hours.  No results found for: HGBA1C ------------------------------------------------------------------------------------------------------------------ No results for input(s): TSH, T4TOTAL, T3FREE, THYROIDAB in the last 72 hours.  Invalid input(s): FREET3  Cardiac Enzymes No results for input(s): CKMB, TROPONINI, MYOGLOBIN in the last 168 hours.  Invalid input(s): CK ------------------------------------------------------------------------------------------------------------------    Component Value Date/Time   BNP 223.3 (H) 08/09/2019 0544    Micro Results Recent Results (from the past 240 hour(s))  Respiratory Panel by RT PCR (Flu A&B, Covid) - Nasopharyngeal Swab     Status: Abnormal   Collection Time: 08/05/19  9:18 PM   Specimen: Nasopharyngeal Swab  Result Value Ref Range Status   SARS Coronavirus 2 by RT PCR POSITIVE (A) NEGATIVE Final    Comment: RESULT CALLED TO, READ BACK BY AND VERIFIED WITH: O RAGNER RN 08/05/19 2319 JDW (NOTE) SARS-CoV-2 target nucleic acids are DETECTED. SARS-CoV-2 RNA is generally detectable in upper respiratory specimens  during the acute phase of infection. Positive results are indicative of the presence of the identified virus, but do not rule out bacterial infection or co-infection with other pathogens not detected by the test. Clinical correlation with patient  history and other diagnostic information is necessary to determine patient infection status. The expected result is Negative. Fact Sheet for Patients:  PinkCheek.be Fact Sheet for Healthcare Providers: GravelBags.it This test is not yet approved or cleared by the [place] FDA and  has been authorized for detection and/or diagnosis of SARS-CoV-2 by FDA under an Emergency Use Authorization (EUA).  This EUA will remain in effect (meaning this test can be used) for the  duration of  the COVID-19 declaration under Section 564(b)(1) of the Act, 21 U.S.C. section 360bbb-3(b)(1), unless the authorization is terminated or revoked sooner.    Influenza A by PCR NEGATIVE NEGATIVE Final   Influenza B by PCR NEGATIVE NEGATIVE Final    Comment: (NOTE) The Xpert Xpress SARS-CoV-2/FLU/RSV assay is intended as an aid in  the diagnosis of influenza from Nasopharyngeal swab specimens and  should not be used as a sole basis for treatment. Nasal washings and  aspirates are unacceptable for Xpert Xpress SARS-CoV-2/FLU/RSV  testing. Fact Sheet for Patients: PinkCheek.be Fact Sheet for Healthcare Providers: GravelBags.it This test is not yet approved or cleared by the [place] FDA and  has been authorized for detection and/or diagnosis of SARS-CoV-2 by  FDA under an Emergency Use Authorization (EUA). This EUA will remain  in effect (meaning this test can be used) for the duration of the  Covid-19 declaration under Section 564(b)(1) of the Act, 21  U.S.C. section 360bbb-3(b)(1), unless the authorization is  terminated or revoked. Performed at [facility], [place] [address] [place], [place] [address]   Culture, blood (routine x 2)     Status: Abnormal   Collection Time: 08/05/19  9:32 PM   Specimen: BLOOD RIGHT FOREARM  Result Value Ref Range Status   Specimen Description  BLOOD RIGHT FOREARM  Final   Special Requests   Final    BOTTLES DRAWN AEROBIC AND ANAEROBIC Blood Culture adequate volume   Culture  Setup Time   Final    ANAEROBIC BOTTLE ONLY GRAM POSITIVE COCCI IN CLUSTERS CRITICAL RESULT CALLED TO, READ BACK BY AND VERIFIED WITH: J MILLEN PHARMD 08/06/19 1956 JDW    Culture (A)  Final    STAPHYLOCOCCUS SPECIES (COAGULASE NEGATIVE) THE SIGNIFICANCE OF ISOLATING THIS ORGANISM FROM A SINGLE SET OF BLOOD CULTURES  WHEN MULTIPLE SETS ARE DRAWN IS UNCERTAIN. PLEASE NOTIFY THE MICROBIOLOGY DEPARTMENT WITHIN ONE WEEK IF SPECIATION AND SENSITIVITIES ARE REQUIRED. Performed at [facility], [place] [address], [place], [place] [address]    Report Status 08/08/2019 FINAL  Final  Blood Culture ID Panel (Reflexed)     Status: Abnormal   Collection Time: 08/05/19  9:32 PM  Result Value Ref Range Status   Enterococcus species NOT DETECTED NOT DETECTED Final   Listeria monocytogenes NOT DETECTED NOT DETECTED Final   Staphylococcus species DETECTED (A) NOT DETECTED Final    Comment: Methicillin (oxacillin) susceptible coagulase negative staphylococcus. Possible blood culture contaminant (unless isolated from more than one blood culture draw or clinical case suggests pathogenicity). No antibiotic treatment is indicated for blood  culture contaminants. CRITICAL RESULT CALLED TO, READ BACK BY AND VERIFIED WITH: J MILLEN PHARMD 08/06/19 1956 JDW    Staphylococcus aureus (BCID) NOT DETECTED NOT DETECTED Final   Methicillin resistance NOT DETECTED NOT DETECTED Final   Streptococcus species NOT DETECTED NOT DETECTED Final   Streptococcus agalactiae NOT DETECTED NOT DETECTED Final   Streptococcus pneumoniae NOT DETECTED NOT DETECTED Final   Streptococcus pyogenes NOT DETECTED NOT DETECTED Final   Acinetobacter baumannii NOT DETECTED NOT DETECTED Final   Enterobacteriaceae species NOT DETECTED NOT DETECTED Final   Enterobacter cloacae complex NOT DETECTED NOT DETECTED  Final   Escherichia coli NOT DETECTED NOT DETECTED Final   Klebsiella oxytoca NOT DETECTED NOT DETECTED Final   Klebsiella pneumoniae NOT DETECTED NOT DETECTED Final   Proteus species NOT DETECTED NOT DETECTED Final   Serratia marcescens NOT DETECTED NOT DETECTED Final   Haemophilus influenzae NOT DETECTED NOT DETECTED Final   Neisseria meningitidis NOT DETECTED NOT DETECTED Final   Pseudomonas aeruginosa NOT DETECTED NOT DETECTED Final   Candida albicans NOT DETECTED NOT DETECTED Final   Candida glabrata NOT DETECTED NOT DETECTED Final   Candida krusei NOT DETECTED NOT DETECTED Final   Candida parapsilosis NOT DETECTED NOT DETECTED Final   Candida tropicalis NOT DETECTED NOT DETECTED Final    Comment: Performed at [name], [place]. [address], [place], [place] [address]  Culture, blood (routine x 2)     Status: None (Preliminary result)   Collection Time: 08/05/19  9:33 PM   Specimen: BLOOD LEFT FOREARM  Result Value Ref Range Status   Specimen Description BLOOD LEFT FOREARM  Final   Special Requests   Final    BOTTLES DRAWN AEROBIC AND ANAEROBIC Blood Culture adequate volume   Culture   Final    NO GROWTH 4 DAYS Performed at [facility], [place] [address], [place], [place] [address]    Report Status PENDING  Incomplete    Radiology Reports CT Chest W Contrast  Result Date: 08/05/2019 CLINICAL DATA:  Shortness of breath. Hiccups. Fever. EXAM: CT CHEST WITH CONTRAST TECHNIQUE: Multidetector CT imaging of the chest was performed during intravenous contrast administration. CONTRAST:  67m OMNIPAQUE IOHEXOL 300 MG/ML  SOLN COMPARISON:  Radiograph earlier this day. FINDINGS: Cardiovascular: Aortic atherosclerosis. No dissection. No filling defects in the central most pulmonary arteries to the proximal lobar level. Heart is normal in size. There are coronary artery calcifications. No pericardial effusion. Mediastinum/Nodes: Prominent lower paratracheal node measures 9 mm. No  hilar adenopathy. Patulous upper esophagus with wall thickening distally. Lower most chest including the gastroesophageal junction are obscured by motion artifact. Lungs/Pleura: Moderate apical predominant emphysema. Bronchial thickening in the mid lower lung zones with areas of mucous  plugging involving the right middle and both lower lobes. Breathing motion artifact significantly limits assessment of the lung bases. There is patchy airspace disease in the right middle lobe and lower lobes. Patchy and consolidative opacity at the left lung base, with significant mucous plugging in this region. No significant pleural effusion small perifissural area of scarring in the right upper lobe. Minimal retained mucus in the dependent trachea. There is a pleural based 7 mm left lower lobe nodule, series 4, image 137. Upper Abdomen: 9 mm low-density indeterminate lesion in the posterior right kidney. Hepatic steatosis. Motion artifact in the upper abdomen. Musculoskeletal: There are no acute or suspicious osseous abnormalities. IMPRESSION: 1. Bronchial thickening with areas of mucous plugging. Patchy consolidation in the right middle and right lower lobe in the areas of mucous plugging which may be postobstructive atelectasis/pneumonia. 2. Dense consolidation in the left lower lobe posteriorly, with likely significant mucous plugging in this region, however motion artifact limits detailed assessment. Favor pneumonia. Recommend radiographic follow-up. 3. Patulous upper esophagus with wall thickening distally, can be seen with reflux or esophagitis. 4. Emphysema. 5. Coronary artery calcifications. 6. A 9 mm low-density lesion in the posterior right kidney is too small to accurately characterize, likely a minimally complex cyst. Aortic Atherosclerosis (ICD10-I70.0) and Emphysema (ICD10-J43.9). Electronically Signed   By: Keith Rake M.D.   On: 08/05/2019 23:17   DG Chest Port 1 View  Result Date: 08/07/2019 CLINICAL  DATA:  Shortness of breath, COVID EXAM: PORTABLE CHEST 1 VIEW COMPARISON:  08/06/2019 FINDINGS: The heart size and mediastinal contours are within normal limits. Both lungs are clear. The visualized skeletal structures are unremarkable. IMPRESSION: No acute abnormality of the lungs in AP portable projection. Electronically Signed   By: Eddie Candle M.D.   On: 08/07/2019 08:55   DG Chest Port 1 View  Result Date: 08/06/2019 CLINICAL DATA:  Shortness of breath. EXAM: PORTABLE CHEST 1 VIEW COMPARISON:  08/05/2019 and CT 08/05/2018 FINDINGS: Lungs are adequately inflated demonstrate stable hazy opacification over the left base/retrocardiac region and medial right base. No definite effusion. Cardiomediastinal silhouette and remainder the exam is unchanged. IMPRESSION: Persistent mild bibasilar opacification most prominent in the left base/retrocardiac region compatible recent CT findings suggesting pneumonia. Electronically Signed   By: Marin Olp M.D.   On: 08/06/2019 08:20   DG Chest Portable 1 View  Result Date: 08/05/2019 CLINICAL DATA:  Fever. EXAM: PORTABLE CHEST 1 VIEW COMPARISON:  Chest radiograph 04/10/2013 FINDINGS: Monitoring leads overlie the patient. Normal cardiac and mediastinal contours. Bibasilar heterogeneous opacities, left greater than right. No pleural effusion or pneumothorax. IMPRESSION: Left-greater-than-right basilar heterogeneous opacities may represent pneumonia in the appropriate clinical setting. Followup PA and lateral chest X-ray is recommended in 3-4 weeks following trial of antibiotic therapy to ensure resolution and exclude underlying malignancy. Electronically Signed   By: Lovey Newcomer M.D.   On: 08/05/2019 20:28

## 2019-08-10 ENCOUNTER — Inpatient Hospital Stay (HOSPITAL_COMMUNITY): Payer: Medicare Other

## 2019-08-10 DIAGNOSIS — R7989 Other specified abnormal findings of blood chemistry: Secondary | ICD-10-CM

## 2019-08-10 LAB — COMPREHENSIVE METABOLIC PANEL
ALT: 16 U/L (ref 0–44)
AST: 19 U/L (ref 15–41)
Albumin: 2.5 g/dL — ABNORMAL LOW (ref 3.5–5.0)
Alkaline Phosphatase: 34 U/L — ABNORMAL LOW (ref 38–126)
Anion gap: 10 (ref 5–15)
BUN: 8 mg/dL (ref 8–23)
CO2: 26 mmol/L (ref 22–32)
Calcium: 8.5 mg/dL — ABNORMAL LOW (ref 8.9–10.3)
Chloride: 99 mmol/L (ref 98–111)
Creatinine, Ser: 0.85 mg/dL (ref 0.61–1.24)
GFR calc Af Amer: >60 mL/min (ref >60)
GFR calc non Af Amer: >60 mL/min (ref >60)
Glucose, Bld: 98 mg/dL (ref 70–99)
Potassium: 3.6 mmol/L (ref 3.5–5.1)
Sodium: 135 mmol/L (ref 135–145)
Total Bilirubin: 0.6 mg/dL (ref 0.3–1.2)
Total Protein: 5.9 g/dL — ABNORMAL LOW (ref 6.5–8.1)

## 2019-08-10 LAB — CBC WITH DIFFERENTIAL/PLATELET
Abs Immature Granulocytes: 0 10*3/uL (ref 0.00–0.07)
Basophils Absolute: 0 10*3/uL (ref 0.0–0.1)
Basophils Relative: 0 %
Eosinophils Absolute: 0 10*3/uL (ref 0.0–0.5)
Eosinophils Relative: 0 %
HCT: 41 % (ref 39.0–52.0)
Hemoglobin: 13.8 g/dL (ref 13.0–17.0)
Lymphocytes Relative: 17 %
Lymphs Abs: 0.7 10*3/uL (ref 0.7–4.0)
MCH: 30.1 pg (ref 26.0–34.0)
MCHC: 33.7 g/dL (ref 30.0–36.0)
MCV: 89.3 fL (ref 80.0–100.0)
Monocytes Absolute: 0.7 10*3/uL (ref 0.1–1.0)
Monocytes Relative: 19 %
Neutro Abs: 2.5 10*3/uL (ref 1.7–7.7)
Neutrophils Relative %: 64 %
Platelets: 399 10*3/uL (ref 150–400)
RBC: 4.59 MIL/uL (ref 4.22–5.81)
RDW: 14.6 % (ref 11.5–15.5)
WBC: 3.9 10*3/uL — ABNORMAL LOW (ref 4.0–10.5)
nRBC: 0 % (ref 0.0–0.2)
nRBC: 0 /100 WBC

## 2019-08-10 LAB — D-DIMER, QUANTITATIVE: D-Dimer, Quant: 2.21 ug/mL-FEU — ABNORMAL HIGH (ref 0.00–0.50)

## 2019-08-10 LAB — CULTURE, BLOOD (ROUTINE X 2): Special Requests: ADEQUATE

## 2019-08-10 LAB — BRAIN NATRIURETIC PEPTIDE: B Natriuretic Peptide: 202 pg/mL — ABNORMAL HIGH (ref 0.0–100.0)

## 2019-08-10 LAB — C-REACTIVE PROTEIN: CRP: 3.2 mg/dL — ABNORMAL HIGH (ref <1.0)

## 2019-08-10 LAB — MAGNESIUM: Magnesium: 1.7 mg/dL (ref 1.7–2.4)

## 2019-08-10 LAB — PROCALCITONIN: Procalcitonin: <0.1 ng/mL

## 2019-08-10 MED ORDER — ENOXAPARIN SODIUM 40 MG/0.4ML ~~LOC~~ SOLN
40.0000 mg | Freq: Two times a day (BID) | SUBCUTANEOUS | Status: DC
  Administered 2019-08-10 – 2019-08-11 (×3): 40 mg via SUBCUTANEOUS
  Filled 2019-08-10 (×3): qty 0.4

## 2019-08-10 NOTE — TOC Initial Note (Signed)
Transition of Care [facility]) - Initial/Assessment Note    Patient Details  Name: Robert Molina MRN: [id number] Date of Birth: [birth year]  [facility]) CM/SW Contact:    Benard Halsted, LCSW Phone Number: 08/10/2019, 11:49 AM  Clinical Narrative:                 CSW received consult for possible SNF placement at time of discharge. CSW spoke with patient regarding PT recommendation of SNF placement at time of discharge. Patient appeared oriented and aware of his current situation. Patient reported that patient's girlfriend, Mae, is currently also on this unit at the hospital and she has been at a SNF [facility]). Patient stated that he lives alone and expressed understanding of PT recommendation and is agreeable to SNF placement at time of discharge. CSW discussed insurance authorization process and provided Medicare SNF ratings list. Patient expressed being hopeful for rehab and to feel better soon. No further questions reported at this time. CSW to continue to follow and assist with discharge planning needs.   Expected Discharge Plan: Skilled Nursing Facility Barriers to Discharge: Continued Medical Work up, Ship broker   Patient Goals and CMS Choice Patient states their goals for this hospitalization and ongoing recovery are:: Rehab CMS Medicare.gov Compare Post Acute Care list provided to:: Patient Choice offered to / list presented to : Patient  Expected Discharge Plan and Services Expected Discharge Plan: [place] In-house Referral: Clinical Social Work   Post Acute Care Choice: [place] Living arrangements for the past 2 months: Apartment                                      Prior Living Arrangements/Services Living arrangements for the past 2 months: Apartment Lives with:: Self Patient language and need for interpreter reviewed:: Yes Do you feel safe going back to the place where you live?: Yes      Need for  Family Participation in Patient Care: Yes (Comment) Care giver support system in place?: No (comment)   Criminal Activity/Legal Involvement Pertinent to Current Situation/Hospitalization: No - Comment as needed  Activities of Daily Living      Permission Sought/Granted Permission sought to share information with : Facility Sport and exercise psychologist, Family Supports Permission granted to share information with : Yes, Verbal Permission Granted  Share Information with NAME: Albertine Patricia  Permission granted to share info w AGENCY: SNFs  Permission granted to share info w Relationship: Significant Other  Permission granted to share info w Contact Information: [phone]  Emotional Assessment Appearance:: Appears stated age Attitude/Demeanor/Rapport: Engaged Affect (typically observed): Accepting, Appropriate Orientation: : Oriented to Place, Oriented to Self, Oriented to Situation Alcohol / Substance Use: Alcohol Use Psych Involvement: No (comment)  Admission diagnosis:  Metabolic encephalopathy 99991111 Hyponatremia [E87.1] Hiccups [R06.6] Esophagitis [K20.90] Pneumonia due to COVID-19 virus [U07.1, J12.82] Patient Active Problem List   Diagnosis Date Noted  . Hiccups   . Hyponatremia   . Metabolic encephalopathy   . Pneumonia due to COVID-19 virus 08/05/2019  . 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

## 2019-08-10 NOTE — Progress Notes (Signed)
CSW contacted patient's significant other listed on Facesheet, however, number is incorrect per the gentleman that answered the phone [phone]). CSW continuing to attempt to find family to assist in decision making for patient as he is noted to be confused.   Lucky Trotta LCSW

## 2019-08-10 NOTE — Evaluation (Signed)
Occupational Therapy Evaluation Patient Details Name: Robert Molina MRN: [id number] DOB: 02/11/1951 Today's Date: 08/10/2019    History of Present Illness 69 y.o. male with no significant past medical history with chronic tobacco and alcohol abuse was found to be increasingly confused by patient's neighbors and EMS was alerted and was brought to the ER. Found to be COVID positive and admitted 08/05/19 for COVID PNA as well as possible aspiration PNA.    Clinical Impression   This 69 y/o male presents with the above. PTA pt reports independence with ADL and mobility tasks. Pt reporting generally feeling unwell today, presenting with weakness, decreased activity tolerance, balance and cognition. Pt requiring minA for functional transfers and taking few side steps along EOB; requiring at least minA for LB ADL. Pt slow to initiate mobility/functional tasks today requiring cues to do so. He reports he lives with spouse, reports spouse is also hospitalized but reports not for covid (?unsure of accuracy of report). Pt will benefit from continued acute OT services, given current deficits and currently with decreased caregiver support at home feel he is most appropriate for SNF level therapies at time of discharge. Will continue to follow to progress pt towards his PLOF.   Follow Up Recommendations  SNF;Supervision/Assistance - 24 hour    Equipment Recommendations  Other (comment)(TBD)           Precautions / Restrictions Precautions Precautions: Fall Restrictions Weight Bearing Restrictions: No      Mobility Bed Mobility Overal bed mobility: Needs Assistance Bed Mobility: Supine to Sit;Sit to Supine     Supine to sit: Min guard Sit to supine: Min guard   General bed mobility comments: for safety, cues and increased time to initiate   Transfers Overall transfer level: Needs assistance Equipment used: None Transfers: Sit to/from Stand Sit to Stand: Min assist         General  transfer comment: good power up, min A required for steadying in standing     Balance Overall balance assessment: Needs assistance Sitting-balance support: Feet supported;No upper extremity supported Sitting balance-Leahy Scale: Fair     Standing balance support: No upper extremity supported;During functional activity Standing balance-Leahy Scale: Poor Standing balance comment: pt seeking UE support despite stating he doesn't need assist to stand                           ADL either performed or assessed with clinical judgement   ADL Overall ADL's : Needs assistance/impaired Eating/Feeding: Set up;Sitting Eating/Feeding Details (indicate cue type and reason): encouraged PO intake as breakfast untouched Grooming: Set up;Supervision/safety;Sitting;Wash/dry face Grooming Details (indicate cue type and reason): seated EOB Upper Body Bathing: Min guard;Set up;Sitting   Lower Body Bathing: Minimal assistance;Sit to/from stand   Upper Body Dressing : Set up;Min guard;Sitting   Lower Body Dressing: Minimal assistance;Sit to/from stand Lower Body Dressing Details (indicate cue type and reason): requires assist for standing balance Toilet Transfer: Minimal assistance;Stand-pivot Toilet Transfer Details (indicate cue type and reason): simulated via transfer to/from EOB, side steps along EOB Toileting- Clothing Manipulation and Hygiene: Minimal assistance;Sit to/from stand       Functional mobility during ADLs: Minimal assistance(sit<>stand) General ADL Comments: pt with nausea this AM and generally feeling unwell, presenting with impaired cognition, weakness, decreased activity tolerance                         Pertinent Vitals/Pain Pain Assessment: Faces Faces Pain  Scale: Hurts a little bit Pain Location: generalized discomfort Pain Intervention(s): Limited activity within patient's tolerance;Monitored during session     Hand Dominance     Extremity/Trunk  Assessment Upper Extremity Assessment Upper Extremity Assessment: Generalized weakness   Lower Extremity Assessment Lower Extremity Assessment: Defer to PT evaluation       Communication Communication Communication: No difficulties   Cognition Arousal/Alertness: Awake/alert;Lethargic Behavior During Therapy: Flat affect Overall Cognitive Status: Impaired/Different from baseline Area of Impairment: Safety/judgement;Problem solving                         Safety/Judgement: Decreased awareness of safety;Decreased awareness of deficits   Problem Solving: Difficulty sequencing;Slow processing;Requires verbal cues;Requires tactile cues;Decreased initiation General Comments: slow to initiate mobility tasks, requires cues to do so   General Comments       Exercises     Shoulder Instructions      Home Living Family/patient expects to be discharged to:: Private residence Living Arrangements: Spouse/significant other Available Help at Discharge: Family;Available PRN/intermittently Type of Home: Apartment Home Access: Stairs to enter Entrance Stairs-Number of Steps: 16 Entrance Stairs-Rails: Right;Left Home Layout: One level     Bathroom Shower/Tub: Occupational psychologist: Standard Bathroom Accessibility: Yes   Home Equipment: Grab bars - tub/shower;Shower seat - built in;Hand held shower head          Prior Functioning/Environment Level of Independence: Independent                 OT Problem List: Decreased strength;Decreased range of motion;Decreased activity tolerance;Decreased cognition;Decreased safety awareness;Cardiopulmonary status limiting activity;Decreased knowledge of use of DME or AE;Impaired balance (sitting and/or standing)      OT Treatment/Interventions: Self-care/ADL training;Therapeutic exercise;Energy conservation;DME and/or AE instruction;Therapeutic activities;Cognitive remediation/compensation;Patient/family  education;Balance training    OT Goals(Current goals can be found in the care plan section) Acute Rehab OT Goals Patient Stated Goal: get some rest OT Goal Formulation: With patient Time For Goal Achievement: 08/24/19 Potential to Achieve Goals: Good  OT Frequency: Min 2X/week   Barriers to D/C:            Co-evaluation              AM-PAC OT "6 Clicks" Daily Activity     Outcome Measure Help from another person eating meals?: A Little Help from another person taking care of personal grooming?: A Little Help from another person toileting, which includes using toliet, bedpan, or urinal?: A Lot Help from another person bathing (including washing, rinsing, drying)?: A Lot Help from another person to put on and taking off regular upper body clothing?: A Little Help from another person to put on and taking off regular lower body clothing?: A Lot 6 Click Score: 15   End of Session Nurse Communication: Mobility status  Activity Tolerance: Patient tolerated treatment well Patient left: in bed;with call bell/phone within reach;with bed alarm set  OT Visit Diagnosis: Muscle weakness (generalized) (M62.81);Unsteadiness on feet (R26.81);Other symptoms and signs involving cognitive function                Time: [id number] OT Time Calculation (min): 17 min Charges:  OT General Charges $OT Visit: 1 Visit OT Evaluation $OT Eval Moderate Complexity: [place], OT Acute Rehabilitation Services Pager [phone] Office [place] 08/10/2019, 12:28 PM

## 2019-08-10 NOTE — Progress Notes (Signed)
PROGRESS NOTE                                                                                                                                                                                                             Patient Demographics:    Robert Molina, is a 69 y.o. male, DOB - 07-Sep-1950, DTO:[id number]  Outpatient Primary MD for the patient is Patient, No Pcp Per    LOS - 5  Admit date - 08/05/2019    Chief Complaint  Patient presents with   Altered Mental Status       Brief Narrative - Robert Molina is a 69 y.o. male with no significant past medical history with chronic tobacco and alcohol abuse was found to be increasingly confused by patient's neighbors and EMS was alerted and was brought to the ER.  Patient states he has been having hiccups for the last few days denies any chest pain shortness of breath headache nausea vomiting or diarrhea.  Denies any productive cough.  Admits to drinking alcohol about 2 beers every day, in the ER he was diagnosed with COVID-19 pneumonia along with signs of bacterial pneumonia versus chronic aspiration.  Admitted to the hospital.   Subjective:   Patient in bed, appears comfortable, denies any headache, no fever, no chest pain or pressure, no shortness of breath , no abdominal pain. No focal weakness.   Assessment  & Plan :     1. Acute Hypoxic Resp. Failure due to Acute Covid 19 Viral Pneumonitis during the ongoing 2020 Covid 19 Pandemic with possible aspiration bacterial pneumonia - he has mild to moderate disease, suspicion for having both COVID-19 pneumonia along with possible aspiration pneumonia.  New combination of steroids, remdesivir and IV antibiotics.  Will have speech evaluate and continue aspiration precautions, flutter valve and I-S added for pulmonary toiletry.  Cleared by speech, much improved likely home in the next 1 to 2 days.  Encouraged the patient to sit up  in chair in the daytime use I-S and flutter valve for pulmonary toiletry and then prone in bed when at night.  Will advance activity and titrate down oxygen as possible.    Recent Labs  Lab 08/05/19 2118 08/06/19 0242 08/06/19 0242 08/06/19 0735 08/07/19 0514 08/08/19 0700 08/09/19 0544 08/10/19 0530  CRP  --  11.1*   < >  12.2* 9.7* 4.0* 2.5* 3.2*  DDIMER  --  2.89*   < > 2.88* 2.14* 1.73* 2.24* 2.21*  BNP  --   --   --  134.8* 186.4* 193.1* 223.3* 202.0*  PROCALCITON  --  1.14  --   --  1.07 0.53 <0.10 <0.10  SARSCOV2NAA POSITIVE*  --   --   --   --   --   --   --    < > = values in this interval not displayed.    Hepatic Function Latest Ref Rng & Units 08/10/2019 08/09/2019 08/08/2019  Total Protein 6.5 - 8.1 g/dL 5.9(L) 5.6(L) 6.0(L)  Albumin 3.5 - 5.0 g/dL 2.5(L) 2.3(L) 2.4(L)  AST 15 - 41 U/L 19 19 19   ALT 0 - 44 U/L 16 15 15   Alk Phosphatase 38 - 126 U/L 34(L) 36(L) 37(L)  Total Bilirubin 0.3 - 1.2 mg/dL 0.6 0.5 0.6  Bilirubin, Direct 0.0 - 0.2 mg/dL - - -    2.  Ongoing hiccups.  Thorazine added.  If they continue CT abdomen pelvis will be done to rule out any issues with his diaphragm.  3.  Alcohol intake.  Denies any abuse.  Monitor on CIWA protocol.  On folic acid and thiamine.  No signs of DTs.  4.  Generalized weakness.  PT OT consulted, recommended SNF which we will try if he agrees on his home health with PT.  Case management consulted on 08/09/2019.  5.  Dehydration with hyponatremia.  Improving with IV fluids continue.  6.  Possible esophagitis.  PPI with outpatient GI follow-up.  7.  Urinary retention.  Placed on Flomax, Foley placed for 2 days, will give a trial of removing Foley on 08/09/2019, monitor post void bladder scans.  8.  Elevated D-dimer.  Increase Lovenox dose, check leg ultrasound to rule out DVT.    Condition - Fair  Family Communication  Annia Friendly [phone] on 08/06/2019 at 11 AM.  No voicemail set. ( she is now hospitalized)  Code  Status :  Full  Diet :   Diet Order            Diet regular Room service appropriate? Yes; Fluid consistency: Thin  Diet effective now               Disposition Plan  :    Status is: Inpatient  Remains inpatient appropriate because:IV treatments appropriate due to intensity of illness or inability to take PO   Dispo: The patient is from: Home              Anticipated d/c is to:  SNF              Anticipated d/c date is: > 3 days              Patient currently is not medically stable to d/c.  Consults  :  None  Procedures  :    CT - 1. Bronchial thickening with areas of mucous plugging. Patchy consolidation in the right middle and right lower lobe in the areas of mucous plugging which may be postobstructive atelectasis/pneumonia. 2. Dense consolidation in the left lower lobe posteriorly, with likely significant mucous plugging in this region, however motion artifact limits detailed assessment. Favor pneumonia. Recommend radiographic follow-up. 3. Patulous upper esophagus with wall thickening distally, can be seen with reflux or esophagitis. 4. Emphysema. 5. Coronary artery calcifications. 6. A 9 mm low-density lesion in the posterior right kidney is too small  to accurately characterize, likely a minimally complex cyst. Aortic Atherosclerosis (ICD10-I70.0) and Emphysema  PUD Prophylaxis : PPI  DVT Prophylaxis  :  Lovenox   Lab Results  Component Value Date   PLT 399 08/10/2019    Inpatient Medications  Scheduled Meds:  Chlorhexidine Gluconate Cloth  6 each Topical Daily   enoxaparin (LOVENOX) injection  40 mg Subcutaneous O11X   folic acid  1 mg Oral Daily   multivitamin with minerals  1 tablet Oral Daily   pantoprazole  40 mg Oral Daily   tamsulosin  0.4 mg Oral Daily   thiamine  100 mg Oral Daily   Continuous Infusions:  cefTRIAXone (ROCEPHIN)  IV 2 g (08/09/19 2106)   chlorproMAZINE (THORAZINE) IV     PRN Meds:.chlorproMAZINE (THORAZINE) IV,  [DISCONTINUED] ondansetron **OR** ondansetron (ZOFRAN) IV  Antibiotics  :    Anti-infectives (From admission, onward)   Start     Dose/Rate Route Frequency Ordered Stop   08/07/19 1000  remdesivir 100 mg in sodium chloride 0.9 % 100 mL IVPB     100 mg 200 mL/hr over 30 Minutes Intravenous Daily 08/06/19 0009 08/10/19 0957   08/06/19 2200  azithromycin (ZITHROMAX) 500 mg in sodium chloride 0.9 % 250 mL IVPB  Status:  Discontinued     500 mg 250 mL/hr over 60 Minutes Intravenous Every 24 hours 08/06/19 0636 08/06/19 0734   08/06/19 2100  cefTRIAXone (ROCEPHIN) 2 g in sodium chloride 0.9 % 100 mL IVPB     2 g 200 mL/hr over 30 Minutes Intravenous Every 24 hours 08/06/19 0636 08/11/19 2059   08/06/19 0800  azithromycin (ZITHROMAX) 500 mg in sodium chloride 0.9 % 250 mL IVPB     500 mg 250 mL/hr over 60 Minutes Intravenous Every 24 hours 08/06/19 0737 08/10/19 1025   08/06/19 0100  remdesivir 200 mg in sodium chloride 0.9% 250 mL IVPB     200 mg 580 mL/hr over 30 Minutes Intravenous Once 08/06/19 0009 08/06/19 0241   08/05/19 2100  cefTRIAXone (ROCEPHIN) 1 g in sodium chloride 0.9 % 100 mL IVPB     1 g 200 mL/hr over 30 Minutes Intravenous  Once 08/05/19 2034 08/05/19 2150   08/05/19 2100  azithromycin (ZITHROMAX) 500 mg in sodium chloride 0.9 % 250 mL IVPB     500 mg 250 mL/hr over 60 Minutes Intravenous  Once 08/05/19 2034 08/05/19 2314       Time Spent in minutes  30   Lala Lund M.D on 08/10/2019 at 11:50 AM  To page go to [email or web address] - password [facility]  [facility] -  Office  [phone]     See all Orders from today for further details    Objective:   Vitals:   08/09/19 0625 08/09/19 2208 08/10/19 0003 08/10/19 0657  BP: (!) 128/96 (!) 151/96 (!) 155/108 134/88  Pulse: 86 76 (!) 103 77  Resp:  20    Temp:  98.7 F (37.1 C)    TempSrc:  Oral    SpO2:  94%    Weight:      Height:        Wt Readings from Last 3 Encounters:  08/06/19 61.6 kg    05/06/13 64.4 kg  04/30/13 64.4 kg     Intake/Output Summary (Last 24 hours) at 08/10/2019 1150 Last data filed at 08/10/2019 1100 Gross per 24 hour  Intake --  Output 975 ml  Net -975 ml     Physical Exam  Awake Alert, No  new F.N deficits, Normal affect [place].AT,PERRAL Supple Neck,No JVD, No cervical lymphadenopathy appriciated.  Symmetrical Chest wall movement, Good air movement bilaterally, CTAB RRR,No Gallops, Rubs or new Murmurs, No Parasternal Heave +ve B.Sounds, Abd Soft, No tenderness, No organomegaly appriciated, No rebound - guarding or rigidity. No Cyanosis, Clubbing or edema, No new Rash or bruise    Data Review:    CBC Recent Labs  Lab 08/05/19 2020 08/06/19 0242 08/06/19 0735 08/07/19 0514 08/08/19 0700 08/09/19 0544 08/10/19 0530  WBC 5.5   < > 4.9 6.6 7.6 4.1 3.9*  HGB 13.6   < > 14.2 15.0 13.4 13.3 13.8  HCT 39.4   < > 41.6 44.5 39.9 39.7 41.0  PLT 213   < > 219 241 325 333 399  MCV 89.3   < > 88.5 91.2 89.5 89.4 89.3  MCH 30.8   < > 30.2 30.7 30.0 30.0 30.1  MCHC 34.5   < > 34.1 33.7 33.6 33.5 33.7  RDW 14.6   < > 14.6 15.1 15.1 14.9 14.6  LYMPHSABS 0.5*  --   --  0.6* 0.7 1.2 0.7  MONOABS 1.6*  --   --  1.2* 1.0 0.7 0.7  EOSABS 0.0  --   --  0.0 0.0 0.0 0.0  BASOSABS 0.0  --   --  0.0 0.0 0.0 0.0   < > = values in this interval not displayed.    Chemistries  Recent Labs  Lab 08/06/19 0704 08/06/19 0704 08/06/19 0735 08/06/19 1633 08/07/19 0043 08/07/19 0514 08/08/19 0700 08/09/19 0544 08/10/19 0530  NA 128*  --   --    < > 131* 131* 136 136 135  K 4.3  --   --    < > 4.1 4.4 4.3 3.4* 3.6  CL 93*  --   --    < > 97* 97* 101 100 99  CO2 24  --   --    < > 23 25 25 30 26   GLUCOSE 153*  --   --    < > 151* 142* 114* 88 98  BUN 9  --   --    < > 13 13 8 8 8   CREATININE 0.85  --   --    < > 0.79 0.81 0.77 0.86 0.85  CALCIUM 8.5*  --   --    < > 8.4* 8.5* 8.4* 8.5* 8.5*  AST 30  --   --   --   --  33 19 19 19   ALT 20  --   --   --   --   16 15 15 16   ALKPHOS 37*  --   --   --   --  36* 37* 36* 34*  BILITOT 0.5  --   --   --   --  1.0 0.6 0.5 0.6  MG 1.9   < > 1.9  --   --  2.0 1.9 1.7 1.7  AMMONIA 25  --   --   --   --   --   --   --   --   TSH 0.168*  --   --   --   --   --   --   --   --    < > = values in this interval not displayed.     ------------------------------------------------------------------------------------------------------------------ No results for input(s): CHOL, HDL, LDLCALC, TRIG, CHOLHDL, LDLDIRECT in the last 72 hours.  No results found for: HGBA1C ------------------------------------------------------------------------------------------------------------------  No results for input(s): TSH, T4TOTAL, T3FREE, THYROIDAB in the last 72 hours.  Invalid input(s): FREET3  Cardiac Enzymes No results for input(s): CKMB, TROPONINI, MYOGLOBIN in the last 168 hours.  Invalid input(s): CK ------------------------------------------------------------------------------------------------------------------    Component Value Date/Time   BNP 202.0 (H) 08/10/2019 0530    Micro Results Recent Results (from the past 240 hour(s))  Respiratory Panel by RT PCR (Flu A&B, Covid) - Nasopharyngeal Swab     Status: Abnormal   Collection Time: 08/05/19  9:18 PM   Specimen: Nasopharyngeal Swab  Result Value Ref Range Status   SARS Coronavirus 2 by RT PCR POSITIVE (A) NEGATIVE Final    Comment: RESULT CALLED TO, READ BACK BY AND VERIFIED WITH: O RAGNER RN 08/05/19 2319 JDW (NOTE) SARS-CoV-2 target nucleic acids are DETECTED. SARS-CoV-2 RNA is generally detectable in upper respiratory specimens  during the acute phase of infection. Positive results are indicative of the presence of the identified virus, but do not rule out bacterial infection or co-infection with other pathogens not detected by the test. Clinical correlation with patient history and other diagnostic information is necessary to determine  patient infection status. The expected result is Negative. Fact Sheet for Patients:  PinkCheek.be Fact Sheet for Healthcare Providers: GravelBags.it This test is not yet approved or cleared by the [place] FDA and  has been authorized for detection and/or diagnosis of SARS-CoV-2 by FDA under an Emergency Use Authorization (EUA).  This EUA will remain in effect (meaning this test can be used) for the  duration of  the COVID-19 declaration under Section 564(b)(1) of the Act, 21 U.S.C. section 360bbb-3(b)(1), unless the authorization is terminated or revoked sooner.    Influenza A by PCR NEGATIVE NEGATIVE Final   Influenza B by PCR NEGATIVE NEGATIVE Final    Comment: (NOTE) The Xpert Xpress SARS-CoV-2/FLU/RSV assay is intended as an aid in  the diagnosis of influenza from Nasopharyngeal swab specimens and  should not be used as a sole basis for treatment. Nasal washings and  aspirates are unacceptable for Xpert Xpress SARS-CoV-2/FLU/RSV  testing. Fact Sheet for Patients: PinkCheek.be Fact Sheet for Healthcare Providers: GravelBags.it This test is not yet approved or cleared by the [place] FDA and  has been authorized for detection and/or diagnosis of SARS-CoV-2 by  FDA under an Emergency Use Authorization (EUA). This EUA will remain  in effect (meaning this test can be used) for the duration of the  Covid-19 declaration under Section 564(b)(1) of the Act, 21  U.S.C. section 360bbb-3(b)(1), unless the authorization is  terminated or revoked. Performed at [facility], [place] [address] [place], [place] [address]   Culture, blood (routine x 2)     Status: Abnormal   Collection Time: 08/05/19  9:32 PM   Specimen: BLOOD RIGHT FOREARM  Result Value Ref Range Status   Specimen Description BLOOD RIGHT FOREARM  Final   Special Requests   Final    BOTTLES  DRAWN AEROBIC AND ANAEROBIC Blood Culture adequate volume   Culture  Setup Time   Final    ANAEROBIC BOTTLE ONLY GRAM POSITIVE COCCI IN CLUSTERS CRITICAL RESULT CALLED TO, READ BACK BY AND VERIFIED WITH: J MILLEN PHARMD 08/06/19 1956 JDW    Culture (A)  Final    STAPHYLOCOCCUS SPECIES (COAGULASE NEGATIVE) THE SIGNIFICANCE OF ISOLATING THIS ORGANISM FROM A SINGLE SET OF BLOOD CULTURES WHEN MULTIPLE SETS ARE DRAWN IS UNCERTAIN. PLEASE NOTIFY THE MICROBIOLOGY DEPARTMENT WITHIN ONE WEEK IF SPECIATION AND SENSITIVITIES ARE REQUIRED. Performed  at [facility], [place] [address], [place], [place] [address]    Report Status 08/08/2019 FINAL  Final  Blood Culture ID Panel (Reflexed)     Status: Abnormal   Collection Time: 08/05/19  9:32 PM  Result Value Ref Range Status   Enterococcus species NOT DETECTED NOT DETECTED Final   Listeria monocytogenes NOT DETECTED NOT DETECTED Final   Staphylococcus species DETECTED (A) NOT DETECTED Final    Comment: Methicillin (oxacillin) susceptible coagulase negative staphylococcus. Possible blood culture contaminant (unless isolated from more than one blood culture draw or clinical case suggests pathogenicity). No antibiotic treatment is indicated for blood  culture contaminants. CRITICAL RESULT CALLED TO, READ BACK BY AND VERIFIED WITH: J MILLEN PHARMD 08/06/19 1956 JDW    Staphylococcus aureus (BCID) NOT DETECTED NOT DETECTED Final   Methicillin resistance NOT DETECTED NOT DETECTED Final   Streptococcus species NOT DETECTED NOT DETECTED Final   Streptococcus agalactiae NOT DETECTED NOT DETECTED Final   Streptococcus pneumoniae NOT DETECTED NOT DETECTED Final   Streptococcus pyogenes NOT DETECTED NOT DETECTED Final   Acinetobacter baumannii NOT DETECTED NOT DETECTED Final   Enterobacteriaceae species NOT DETECTED NOT DETECTED Final   Enterobacter cloacae complex NOT DETECTED NOT DETECTED Final   Escherichia coli NOT DETECTED NOT DETECTED Final   Klebsiella  oxytoca NOT DETECTED NOT DETECTED Final   Klebsiella pneumoniae NOT DETECTED NOT DETECTED Final   Proteus species NOT DETECTED NOT DETECTED Final   Serratia marcescens NOT DETECTED NOT DETECTED Final   Haemophilus influenzae NOT DETECTED NOT DETECTED Final   Neisseria meningitidis NOT DETECTED NOT DETECTED Final   Pseudomonas aeruginosa NOT DETECTED NOT DETECTED Final   Candida albicans NOT DETECTED NOT DETECTED Final   Candida glabrata NOT DETECTED NOT DETECTED Final   Candida krusei NOT DETECTED NOT DETECTED Final   Candida parapsilosis NOT DETECTED NOT DETECTED Final   Candida tropicalis NOT DETECTED NOT DETECTED Final    Comment: Performed at [facility], [place]. [address], [place], [place] [address]  Culture, blood (routine x 2)     Status: None   Collection Time: 08/05/19  9:33 PM   Specimen: BLOOD LEFT FOREARM  Result Value Ref Range Status   Specimen Description BLOOD LEFT FOREARM  Final   Special Requests   Final    BOTTLES DRAWN AEROBIC AND ANAEROBIC Blood Culture adequate volume   Culture   Final    NO GROWTH 5 DAYS Performed at [facility], [place] [address] [place], [place] [address]    Report Status 08/10/2019 FINAL  Final    Radiology Reports CT Chest W Contrast  Result Date: 08/05/2019 CLINICAL DATA:  Shortness of breath. Hiccups. Fever. EXAM: CT CHEST WITH CONTRAST TECHNIQUE: Multidetector CT imaging of the chest was performed during intravenous contrast administration. CONTRAST:  49m OMNIPAQUE IOHEXOL 300 MG/ML  SOLN COMPARISON:  Radiograph earlier this day. FINDINGS: Cardiovascular: Aortic atherosclerosis. No dissection. No filling defects in the central most pulmonary arteries to the proximal lobar level. Heart is normal in size. There are coronary artery calcifications. No pericardial effusion. Mediastinum/Nodes: Prominent lower paratracheal node measures 9 mm. No hilar adenopathy. Patulous upper esophagus with wall thickening distally. Lower most chest  including the gastroesophageal junction are obscured by motion artifact. Lungs/Pleura: Moderate apical predominant emphysema. Bronchial thickening in the mid lower lung zones with areas of mucous plugging involving the right middle and both lower lobes. Breathing motion artifact significantly limits assessment of the lung bases. There is patchy airspace  disease in the right middle lobe and lower lobes. Patchy and consolidative opacity at the left lung base, with significant mucous plugging in this region. No significant pleural effusion small perifissural area of scarring in the right upper lobe. Minimal retained mucus in the dependent trachea. There is a pleural based 7 mm left lower lobe nodule, series 4, image 137. Upper Abdomen: 9 mm low-density indeterminate lesion in the posterior right kidney. Hepatic steatosis. Motion artifact in the upper abdomen. Musculoskeletal: There are no acute or suspicious osseous abnormalities. IMPRESSION: 1. Bronchial thickening with areas of mucous plugging. Patchy consolidation in the right middle and right lower lobe in the areas of mucous plugging which may be postobstructive atelectasis/pneumonia. 2. Dense consolidation in the left lower lobe posteriorly, with likely significant mucous plugging in this region, however motion artifact limits detailed assessment. Favor pneumonia. Recommend radiographic follow-up. 3. Patulous upper esophagus with wall thickening distally, can be seen with reflux or esophagitis. 4. Emphysema. 5. Coronary artery calcifications. 6. A 9 mm low-density lesion in the posterior right kidney is too small to accurately characterize, likely a minimally complex cyst. Aortic Atherosclerosis (ICD10-I70.0) and Emphysema (ICD10-J43.9). Electronically Signed   By: Keith Rake M.D.   On: 08/05/2019 23:17   DG Chest Port 1 View  Result Date: 08/07/2019 CLINICAL DATA:  Shortness of breath, COVID EXAM: PORTABLE CHEST 1 VIEW COMPARISON:  08/06/2019 FINDINGS:  The heart size and mediastinal contours are within normal limits. Both lungs are clear. The visualized skeletal structures are unremarkable. IMPRESSION: No acute abnormality of the lungs in AP portable projection. Electronically Signed   By: Eddie Candle M.D.   On: 08/07/2019 08:55   DG Chest Port 1 View  Result Date: 08/06/2019 CLINICAL DATA:  Shortness of breath. EXAM: PORTABLE CHEST 1 VIEW COMPARISON:  08/05/2019 and CT 08/05/2018 FINDINGS: Lungs are adequately inflated demonstrate stable hazy opacification over the left base/retrocardiac region and medial right base. No definite effusion. Cardiomediastinal silhouette and remainder the exam is unchanged. IMPRESSION: Persistent mild bibasilar opacification most prominent in the left base/retrocardiac region compatible recent CT findings suggesting pneumonia. Electronically Signed   By: Marin Olp M.D.   On: 08/06/2019 08:20   DG Chest Portable 1 View  Result Date: 08/05/2019 CLINICAL DATA:  Fever. EXAM: PORTABLE CHEST 1 VIEW COMPARISON:  Chest radiograph 04/10/2013 FINDINGS: Monitoring leads overlie the patient. Normal cardiac and mediastinal contours. Bibasilar heterogeneous opacities, left greater than right. No pleural effusion or pneumothorax. IMPRESSION: Left-greater-than-right basilar heterogeneous opacities may represent pneumonia in the appropriate clinical setting. Followup PA and lateral chest X-ray is recommended in 3-4 weeks following trial of antibiotic therapy to ensure resolution and exclude underlying malignancy. Electronically Signed   By: Lovey Newcomer M.D.   On: 08/05/2019 20:28

## 2019-08-10 NOTE — [place] FL2 (Signed)
[place] MEDICAID FL2 LEVEL OF CARE SCREENING TOOL     IDENTIFICATION  Patient Name: Robert Molina Birthdate: 1950/06/30 Sex: male Admission Date (Current Location): 08/05/2019  [facility] and [place] Number:  Herbalist and Address:  [facility], [place] [address], [place], [place] [address]      Provider Number: [id number]  Attending Physician Name and Address:  Thurnell Lose, MD  Relative Name and Phone Number:  Albertine Patricia, Significant other, [phone]    Current Level of Care: Hospital Recommended Level of Care: [place] Prior Approval Number:    Date Approved/Denied:   PASRR Number: [id number]  Discharge Plan: SNF    Current Diagnoses: Patient Active Problem List   Diagnosis Date Noted  . Hiccups   . Hyponatremia   . Metabolic encephalopathy   . Pneumonia due to COVID-19 virus 08/05/2019  . 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    Orientation RESPIRATION BLADDER Height & Weight     Self, Situation, Place  Normal Incontinent Weight: 135 lb 12.9 oz (61.6 kg) Height:  5\' 11"  (180.3 cm)  BEHAVIORAL SYMPTOMS/MOOD NEUROLOGICAL BOWEL NUTRITION STATUS      Continent Diet(Please see DC Summary)  AMBULATORY STATUS COMMUNICATION OF NEEDS Skin   Limited Assist Verbally Normal                       Personal Care Assistance Level of Assistance  Bathing, Feeding, Dressing Bathing Assistance: Limited assistance Feeding assistance: Limited assistance Dressing Assistance: Limited assistance     Functional Limitations Info  Sight, Hearing, Speech Sight Info: Adequate Hearing Info: Adequate Speech Info: Adequate    SPECIAL CARE FACTORS FREQUENCY  PT (By licensed PT), OT (By licensed OT)     PT Frequency: 5x/week OT Frequency: 5x/week            Contractures Contractures Info: Not present    Additional Factors Info  Code Status, Allergies, Isolation Precautions Code Status Info: Full Allergies Info: NKA      Isolation Precautions Info: COVID positive     Current Medications (08/10/2019):  This is the current hospital active medication list Current Facility-Administered Medications  Medication Dose Route Frequency Provider Last Rate Last Admin  . cefTRIAXone (ROCEPHIN) 2 g in sodium chloride 0.9 % 100 mL IVPB  2 g Intravenous Q24H Rise Patience, MD 200 mL/hr at 08/09/19 2106 2 g at 08/09/19 2106  . Chlorhexidine Gluconate Cloth 2 % PADS 6 each  6 each Topical Daily Thurnell Lose, MD   6 each at 08/09/19 1719  . chlorproMAZINE (THORAZINE) 25 mg in sodium chloride 0.9 % 25 mL IVPB  25 mg Intravenous Q8H PRN Lala Lund K, MD      . enoxaparin (LOVENOX) injection 40 mg  40 mg Subcutaneous Q12H Thurnell Lose, MD   40 mg at 08/10/19 0908  . folic acid (FOLVITE) tablet 1 mg  1 mg Oral Daily Rise Patience, MD   1 mg at 08/10/19 0908  . multivitamin with minerals tablet 1 tablet  1 tablet Oral Daily Rise Patience, MD   1 tablet at 08/10/19 0908  . ondansetron (ZOFRAN) injection 4 mg  4 mg Intravenous Q6H PRN Rise Patience, MD      . pantoprazole (PROTONIX) EC tablet 40 mg  40 mg Oral Daily Thurnell Lose, MD   40 mg at 08/10/19 0908  . tamsulosin (FLOMAX) capsule 0.4 mg  0.4 mg Oral Daily Lala Lund  K, MD   0.4 mg at 08/10/19 0908  . thiamine tablet 100 mg  100 mg Oral Daily Rise Patience, MD   100 mg at 08/10/19 0908     Discharge Medications: Please see discharge summary for a list of discharge medications.  Relevant Imaging Results:  Relevant Lab Results:   Additional Information SSN: [place], [place]

## 2019-08-10 NOTE — Progress Notes (Signed)
Lower venous duplex       has been completed. Preliminary results can be found under CV proc through chart review. Cindi Ghazarian, BS, RDMS, RVT   

## 2019-08-10 NOTE — Progress Notes (Signed)
Physical Therapy Treatment Patient Details Name: Robert Molina MRN: [id number] DOB: 05/20/1950 Today's Date: 08/10/2019    History of Present Illness 69 y.o. male with no significant past medical history with chronic tobacco and alcohol abuse was found to be increasingly confused by patient's neighbors and EMS was alerted and was brought to the ER. Found to be COVID positive and admitted 08/05/19 for COVID PNA as well as possible aspiration PNA.     PT Comments    Pt with significant improvements today. He was able to ambulate safely without AD.  Will benefit from progression of gait to include dynamic task and for stair training.   Updated POC as pt has progressed, no longer recommend SNF from PT standpoint.    Follow Up Recommendations  Supervision - Intermittent;Home health PT     Equipment Recommendations  None recommended by PT    Recommendations for Other Services       Precautions / Restrictions Precautions Precautions: Fall    Mobility  Bed Mobility Overal bed mobility: Needs Assistance Bed Mobility: Supine to Sit;Sit to Supine     Supine to sit: Supervision Sit to supine: Supervision      Transfers Overall transfer level: Needs assistance Equipment used: None Transfers: Sit to/from Stand Sit to Stand: Supervision            Ambulation/Gait Ambulation/Gait assistance: Min guard;Supervision Gait Distance (Feet): 300 Feet Assistive device: None       General Gait Details: min guard progressed to supervision; no LOB; did not need UE support from furniture today; near normal gait speed   Stairs             Wheelchair Mobility    Modified Rankin (Stroke Patients Only)       Balance Overall balance assessment: Needs assistance   Sitting balance-Leahy Scale: Normal     Standing balance support: No upper extremity supported Standing balance-Leahy Scale: Good                              Cognition Arousal/Alertness:  Awake/alert Behavior During Therapy: WFL for tasks assessed/performed Overall Cognitive Status: Within Functional Limits for tasks assessed                                        Exercises      General Comments General comments (skin integrity, edema, etc.): VSS on RA      Pertinent Vitals/Pain Pain Assessment: No/denies pain    Home Living                      Prior Function            PT Goals (current goals can now be found in the care plan section) Progress towards PT goals: Progressing toward goals    Frequency    Min 3X/week      PT Plan Discharge plan needs to be updated;Frequency needs to be updated    Co-evaluation              AM-PAC PT "6 Clicks" Mobility   Outcome Measure  Help needed turning from your back to your side while in a flat bed without using bedrails?: None Help needed moving from lying on your back to sitting on the side of a flat bed without using bedrails?: None Help needed moving  to and from a bed to a chair (including a wheelchair)?: None Help needed standing up from a chair using your arms (e.g., wheelchair or bedside chair)?: None Help needed to walk in hospital room?: None Help needed climbing 3-5 steps with a railing? : A Little 6 Click Score: 23    End of Session Equipment Utilized During Treatment: Gait belt Activity Tolerance: Patient tolerated treatment well Patient left: in bed;with call bell/phone within reach;with bed alarm set(sitting EOB) Nurse Communication: Mobility status PT Visit Diagnosis: Unsteadiness on feet (R26.81);Other abnormalities of gait and mobility (R26.89);Muscle weakness (generalized) (M62.81);Difficulty in walking, not elsewhere classified (R26.2)     Time: [id number] PT Time Calculation (min) (ACUTE ONLY): 11 min  Charges:  $Gait Training: 8-22 mins                     Robert Molina, PT Acute Rehab Services Pager [phone] [facility]  [phone] [facility] [phone]    Robert Molina 08/10/2019, 4:47 PM

## 2019-08-11 DIAGNOSIS — R066 Hiccough: Secondary | ICD-10-CM

## 2019-08-11 MED ORDER — PANTOPRAZOLE SODIUM 40 MG PO TBEC: 40.0000 mg | DELAYED_RELEASE_TABLET | Freq: Every day | ORAL | Status: DC

## 2019-08-11 MED ORDER — THIAMINE HCL 100 MG PO TABS: 100.0000 mg | ORAL_TABLET | Freq: Every day | ORAL | Status: DC

## 2019-08-11 MED ORDER — CARVEDILOL 3.125 MG PO TABS
3.1250 mg | ORAL_TABLET | Freq: Two times a day (BID) | ORAL | 11 refills | Status: DC
Start: 2019-08-11 — End: 2020-06-13

## 2019-08-11 MED ORDER — FOLIC ACID 1 MG PO TABS: 1.0000 mg | ORAL_TABLET | Freq: Every day | ORAL | Status: DC

## 2019-08-11 MED ORDER — TAMSULOSIN HCL 0.4 MG PO CAPS: 0.4000 mg | ORAL_CAPSULE | Freq: Every day | ORAL | Status: DC

## 2019-08-11 MED ORDER — CEPHALEXIN 500 MG PO CAPS: 500.0000 mg | ORAL_CAPSULE | Freq: Three times a day (TID) | ORAL | 0 refills | Status: AC

## 2019-08-11 NOTE — Progress Notes (Signed)
Ordered for pt to be bladder scanned QShift, pt has voided using urinal (259ml) but refuses to be bladder scanned at this time, will try to scan again later.

## 2019-08-11 NOTE — Discharge Summary (Signed)
Robert Molina RDE:[id number] DOB: Dec 04, 1950 DOA: 08/05/2019  PCP: Patient, No Pcp Per  Admit date: 08/05/2019  Discharge date: 08/11/2019  Admitted From: Home   Disposition:  SNF   Recommendations for Outpatient Follow-up:   Follow up with PCP in 1-2 weeks  PCP Please obtain BMP/CBC, 2 view CXR in 1week,  (see Discharge instructions)   PCP Please follow up on the following pending results:    Home Health: None  Equipment/Devices: None  Consultations: None  Discharge Condition: Stable    CODE STATUS: Full    Diet Recommendation: Heart Healthy   Diet Order            Diet - low sodium heart healthy        Diet regular Room service appropriate? Yes; Fluid consistency: Thin  Diet effective now               Chief Complaint  Patient presents with  . Altered Mental Status     Brief history of present illness from the day of admission and additional interim summary    Robert Holmesis a 69 y.o.malewithno significant past medical history with chronic tobacco and alcohol abuse was found to be increasingly confused by patient's neighbors and EMS was alerted and was brought to the ER. Patient states he has been having hiccups for the last few days denies any chest pain shortness of breath headache nausea vomiting or diarrhea. Denies any productive cough. Admits to drinking alcohol about 2 beers every day, in the ER he was diagnosed with COVID-19 pneumonia along with signs of bacterial pneumonia versus chronic aspiration.  Admitted to the hospital.                                                                 Hospital Course    1. Acute Hypoxic Resp. Failure due to Acute Covid 19 Viral Pneumonitis during the ongoing 2020 Covid 19 Pandemic with possible aspiration bacterial pneumonia - he had mild to  moderate disease, he also had suspicion of some bacterial pneumonia and was treated with a course of IV antibiotics along with steroids and remdesivir.  Will continue oral antibiotics upon discharge for 7 more days for his pneumonia, completed COVID-19 treatment and now symptom-free on room air.  Blood cultures 1 out of 2 coag negative staph which was contamination, Keflex stop date 08/18/2019.Marland Kitchen  Recent Labs  Lab 08/05/19 2118 08/06/19 0242 08/06/19 0242 08/06/19 0735 08/07/19 0514 08/08/19 0700 08/09/19 0544 08/10/19 0530  CRP  --  11.1*   < > 12.2* 9.7* 4.0* 2.5* 3.2*  DDIMER  --  2.89*   < > 2.88* 2.14* 1.73* 2.24* 2.21*  FERRITIN  --  45  --   --   --   --   --   --  BNP  --   --   --  134.8* 186.4* 193.1* 223.3* 202.0*  PROCALCITON  --  1.14  --   --  1.07 0.53 <0.10 <0.10  SARSCOV2NAA POSITIVE*  --   --   --   --   --   --   --    < > = values in this interval not displayed.    Hepatic Function Latest Ref Rng & Units 08/10/2019 08/09/2019 08/08/2019  Total Protein 6.5 - 8.1 g/dL 5.9(L) 5.6(L) 6.0(L)  Albumin 3.5 - 5.0 g/dL 2.5(L) 2.3(L) 2.4(L)  AST 15 - 41 U/L '19 19 19  '$ ALT 0 - 44 U/L '16 15 15  '$ Alk Phosphatase 38 - 126 U/L 34(L) 36(L) 37(L)  Total Bilirubin 0.3 - 1.2 mg/dL 0.6 0.5 0.6  Bilirubin, Direct 0.0 - 0.2 mg/dL - - -    2.  Hiccups.  resolved.  3.  Alcohol intake.  Denies any abuse. No DTs.  On folic acid and thiamine.     4.  Generalized weakness.  PT OT consulted, recommended SNF which we will try if he agrees on his home health with PT.  Case management consulted on 08/09/2019.  5.  Dehydration with hyponatremia.  Improving with IV fluids continue.  6.  Possible esophagitis.  PPI with outpatient GI follow-up.  7.  Urinary retention.  Placed on Flomax, resolved.  8.  Elevated D-dimer. Negative Leg [name] - no DVT.    Discharge diagnosis     Active Problems:   Pneumonia due to COVID-19 virus    Discharge instructions    Discharge Instructions     Diet - low sodium heart healthy   Complete by: As directed    Discharge instructions   Complete by: As directed    Follow with Primary MD in 7 days   Get CBC, CMP, 2 view Chest X ray -  checked next visit within 1 week by Primary MD or SNF MD  Activity: As tolerated with Full fall precautions use walker/cane & assistance as needed  Disposition SNF  Diet: Heart Healthy    Special Instructions: If you have smoked or chewed Tobacco  in the last 2 yrs please stop smoking, stop any regular Alcohol  and or any Recreational drug use.  On your next visit with your primary care physician please Get Medicines reviewed and adjusted.  Please request your Prim.MD to go over all Hospital Tests and Procedure/Radiological results at the follow up, please get all Hospital records sent to your Prim MD by signing hospital release before you go home.  If you experience worsening of your admission symptoms, develop shortness of breath, life threatening emergency, suicidal or homicidal thoughts you must seek medical attention immediately by calling 911 or calling your MD immediately  if symptoms less severe.  You Must read complete instructions/literature along with all the possible adverse reactions/side effects for all the Medicines you take and that have been prescribed to you. Take any new Medicines after you have completely understood and accpet all the possible adverse reactions/side effects.   Increase activity slowly   Complete by: As directed       Discharge Medications   Allergies as of 08/11/2019   No Known Allergies     Medication List    TAKE these medications   carvedilol 3.125 MG tablet Commonly known as: Coreg Take 1 tablet (3.125 mg total) by mouth 2 (two) times daily.   cephALEXin 500 MG capsule Commonly known as: KEFLEX Take  1 capsule (500 mg total) by mouth 3 (three) times daily for 7 days.   folic acid 1 MG tablet Commonly known as: FOLVITE Take 1 tablet (1 mg total) by  mouth daily. Start taking on: Aug 12, 2019   pantoprazole 40 MG tablet Commonly known as: PROTONIX Take 1 tablet (40 mg total) by mouth daily. Start taking on: Aug 12, 2019   tamsulosin 0.4 MG Caps capsule Commonly known as: FLOMAX Take 1 capsule (0.4 mg total) by mouth daily. Start taking on: Aug 12, 2019   thiamine 100 MG tablet Take 1 tablet (100 mg total) by mouth daily. Start taking on: Aug 12, 2019       Follow-up Information    Gatha Mayer, MD. Schedule an appointment as soon as possible for a visit in 1 week(s).   Specialty: Gastroenterology Contact information: [address] [place] [address] [phone]           Major procedures and Radiology Reports - PLEASE review detailed and final reports thoroughly  -        CT Chest W Contrast  Result Date: 08/05/2019 CLINICAL DATA:  Shortness of breath. Hiccups. Fever. EXAM: CT CHEST WITH CONTRAST TECHNIQUE: Multidetector CT imaging of the chest was performed during intravenous contrast administration. CONTRAST:  2m OMNIPAQUE IOHEXOL 300 MG/ML  SOLN COMPARISON:  Radiograph earlier this day. FINDINGS: Cardiovascular: Aortic atherosclerosis. No dissection. No filling defects in the central most pulmonary arteries to the proximal lobar level. Heart is normal in size. There are coronary artery calcifications. No pericardial effusion. Mediastinum/Nodes: Prominent lower paratracheal node measures 9 mm. No hilar adenopathy. Patulous upper esophagus with wall thickening distally. Lower most chest including the gastroesophageal junction are obscured by motion artifact. Lungs/Pleura: Moderate apical predominant emphysema. Bronchial thickening in the mid lower lung zones with areas of mucous plugging involving the right middle and both lower lobes. Breathing motion artifact significantly limits assessment of the lung bases. There is patchy airspace disease in the right middle lobe and lower lobes. Patchy and consolidative  opacity at the left lung base, with significant mucous plugging in this region. No significant pleural effusion small perifissural area of scarring in the right upper lobe. Minimal retained mucus in the dependent trachea. There is a pleural based 7 mm left lower lobe nodule, series 4, image 137. Upper Abdomen: 9 mm low-density indeterminate lesion in the posterior right kidney. Hepatic steatosis. Motion artifact in the upper abdomen. Musculoskeletal: There are no acute or suspicious osseous abnormalities. IMPRESSION: 1. Bronchial thickening with areas of mucous plugging. Patchy consolidation in the right middle and right lower lobe in the areas of mucous plugging which may be postobstructive atelectasis/pneumonia. 2. Dense consolidation in the left lower lobe posteriorly, with likely significant mucous plugging in this region, however motion artifact limits detailed assessment. Favor pneumonia. Recommend radiographic follow-up. 3. Patulous upper esophagus with wall thickening distally, can be seen with reflux or esophagitis. 4. Emphysema. 5. Coronary artery calcifications. 6. A 9 mm low-density lesion in the posterior right kidney is too small to accurately characterize, likely a minimally complex cyst. Aortic Atherosclerosis (ICD10-I70.0) and Emphysema (ICD10-J43.9). Electronically Signed   By: MKeith RakeM.D.   On: 08/05/2019 23:17   DG Chest Port 1 View  Result Date: 08/07/2019 CLINICAL DATA:  Shortness of breath, COVID EXAM: PORTABLE CHEST 1 VIEW COMPARISON:  08/06/2019 FINDINGS: The heart size and mediastinal contours are within normal limits. Both lungs are clear. The visualized skeletal structures are unremarkable. IMPRESSION: No  acute abnormality of the lungs in AP portable projection. Electronically Signed   By: Eddie Candle M.D.   On: 08/07/2019 08:55   DG Chest Port 1 View  Result Date: 08/06/2019 CLINICAL DATA:  Shortness of breath. EXAM: PORTABLE CHEST 1 VIEW COMPARISON:  08/05/2019 and CT  08/05/2018 FINDINGS: Lungs are adequately inflated demonstrate stable hazy opacification over the left base/retrocardiac region and medial right base. No definite effusion. Cardiomediastinal silhouette and remainder the exam is unchanged. IMPRESSION: Persistent mild bibasilar opacification most prominent in the left base/retrocardiac region compatible recent CT findings suggesting pneumonia. Electronically Signed   By: Marin Olp M.D.   On: 08/06/2019 08:20   DG Chest Portable 1 View  Result Date: 08/05/2019 CLINICAL DATA:  Fever. EXAM: PORTABLE CHEST 1 VIEW COMPARISON:  Chest radiograph 04/10/2013 FINDINGS: Monitoring leads overlie the patient. Normal cardiac and mediastinal contours. Bibasilar heterogeneous opacities, left greater than right. No pleural effusion or pneumothorax. IMPRESSION: Left-greater-than-right basilar heterogeneous opacities may represent pneumonia in the appropriate clinical setting. Followup PA and lateral chest X-ray is recommended in 3-4 weeks following trial of antibiotic therapy to ensure resolution and exclude underlying malignancy. Electronically Signed   By: Lovey Newcomer M.D.   On: 08/05/2019 20:28   VAS [name] LOWER EXTREMITY VENOUS (DVT)  Result Date: 08/10/2019  Lower Venous DVTStudy Indications: D dimer.  Comparison Study: Performing Technologist: June Leap RDMS, RVT  Examination Guidelines: A complete evaluation includes B-mode imaging, spectral Doppler, color Doppler, and power Doppler as needed of all accessible portions of each vessel. Bilateral testing is considered an integral part of a complete examination. Limited examinations for reoccurring indications may be performed as noted. The reflux portion of the exam is performed with the patient in reverse Trendelenburg.  +---------+---------------+---------+-----------+----------+--------------+ RIGHT    CompressibilityPhasicitySpontaneityPropertiesThrombus Aging  +---------+---------------+---------+-----------+----------+--------------+ CFV      Full           Yes      Yes                                 +---------+---------------+---------+-----------+----------+--------------+ SFJ      Full                                                        +---------+---------------+---------+-----------+----------+--------------+ FV Prox  Full                                                        +---------+---------------+---------+-----------+----------+--------------+ FV Mid   Full                                                        +---------+---------------+---------+-----------+----------+--------------+ FV DistalFull                                                        +---------+---------------+---------+-----------+----------+--------------+  PFV      Full                                                        +---------+---------------+---------+-----------+----------+--------------+ POP      Full           Yes      Yes                                 +---------+---------------+---------+-----------+----------+--------------+ PTV      Full                                                        +---------+---------------+---------+-----------+----------+--------------+ PERO     Full                                                        +---------+---------------+---------+-----------+----------+--------------+   +---------+---------------+---------+-----------+----------+--------------+ LEFT     CompressibilityPhasicitySpontaneityPropertiesThrombus Aging +---------+---------------+---------+-----------+----------+--------------+ CFV      Full           Yes      Yes                                 +---------+---------------+---------+-----------+----------+--------------+ SFJ      Full                                                         +---------+---------------+---------+-----------+----------+--------------+ FV Prox  Full                                                        +---------+---------------+---------+-----------+----------+--------------+ FV Mid   Full                                                        +---------+---------------+---------+-----------+----------+--------------+ FV DistalFull                                                        +---------+---------------+---------+-----------+----------+--------------+ PFV      Full                                                        +---------+---------------+---------+-----------+----------+--------------+  POP      Full           Yes      Yes                                 +---------+---------------+---------+-----------+----------+--------------+ PTV      Full                                                        +---------+---------------+---------+-----------+----------+--------------+ PERO     Full                                                        +---------+---------------+---------+-----------+----------+--------------+     Summary: BILATERAL: - No evidence of deep vein thrombosis seen in the lower extremities, bilaterally.   *See table(s) above for measurements and observations. Electronically signed by Ruta Hinds MD on 08/10/2019 at 3:44:30 PM.    Final     Micro Results    Recent Results (from the past 240 hour(s))  Respiratory Panel by RT PCR (Flu A&B, Covid) - Nasopharyngeal Swab     Status: Abnormal   Collection Time: 08/05/19  9:18 PM   Specimen: Nasopharyngeal Swab  Result Value Ref Range Status   SARS Coronavirus 2 by RT PCR POSITIVE (A) NEGATIVE Final    Comment: RESULT CALLED TO, READ BACK BY AND VERIFIED WITH: O RAGNER RN 08/05/19 2319 JDW (NOTE) SARS-CoV-2 target nucleic acids are DETECTED. SARS-CoV-2 RNA is generally detectable in upper respiratory specimens  during the acute phase  of infection. Positive results are indicative of the presence of the identified virus, but do not rule out bacterial infection or co-infection with other pathogens not detected by the test. Clinical correlation with patient history and other diagnostic information is necessary to determine patient infection status. The expected result is Negative. Fact Sheet for Patients:  PinkCheek.be Fact Sheet for Healthcare Providers: GravelBags.it This test is not yet approved or cleared by the [place] FDA and  has been authorized for detection and/or diagnosis of SARS-CoV-2 by FDA under an Emergency Use Authorization (EUA).  This EUA will remain in effect (meaning this test can be used) for the  duration of  the COVID-19 declaration under Section 564(b)(1) of the Act, 21 U.S.C. section 360bbb-3(b)(1), unless the authorization is terminated or revoked sooner.    Influenza A by PCR NEGATIVE NEGATIVE Final   Influenza B by PCR NEGATIVE NEGATIVE Final    Comment: (NOTE) The Xpert Xpress SARS-CoV-2/FLU/RSV assay is intended as an aid in  the diagnosis of influenza from Nasopharyngeal swab specimens and  should not be used as a sole basis for treatment. Nasal washings and  aspirates are unacceptable for Xpert Xpress SARS-CoV-2/FLU/RSV  testing. Fact Sheet for Patients: PinkCheek.be Fact Sheet for Healthcare Providers: GravelBags.it This test is not yet approved or cleared by the [place] FDA and  has been authorized for detection and/or diagnosis of SARS-CoV-2 by  FDA under an Emergency Use Authorization (EUA). This EUA will remain  in effect (meaning this test can be used) for the duration of the  Covid-19 declaration  under Section 564(b)(1) of the Act, 21  U.S.C. section 360bbb-3(b)(1), unless the authorization is  terminated or revoked. Performed at [facility], [place] [address] [place], [place] [address]   Culture, blood (routine x 2)     Status: Abnormal   Collection Time: 08/05/19  9:32 PM   Specimen: BLOOD RIGHT FOREARM  Result Value Ref Range Status   Specimen Description BLOOD RIGHT FOREARM  Final   Special Requests   Final    BOTTLES DRAWN AEROBIC AND ANAEROBIC Blood Culture adequate volume   Culture  Setup Time   Final    ANAEROBIC BOTTLE ONLY GRAM POSITIVE COCCI IN CLUSTERS CRITICAL RESULT CALLED TO, READ BACK BY AND VERIFIED WITH: J MILLEN PHARMD 08/06/19 1956 JDW    Culture (A)  Final    STAPHYLOCOCCUS SPECIES (COAGULASE NEGATIVE) THE SIGNIFICANCE OF ISOLATING THIS ORGANISM FROM A SINGLE SET OF BLOOD CULTURES WHEN MULTIPLE SETS ARE DRAWN IS UNCERTAIN. PLEASE NOTIFY THE MICROBIOLOGY DEPARTMENT WITHIN ONE WEEK IF SPECIATION AND SENSITIVITIES ARE REQUIRED. Performed at [facility], [place] [address], [place], [place] [address]    Report Status 08/08/2019 FINAL  Final  Blood Culture ID Panel (Reflexed)     Status: Abnormal   Collection Time: 08/05/19  9:32 PM  Result Value Ref Range Status   Enterococcus species NOT DETECTED NOT DETECTED Final   Listeria monocytogenes NOT DETECTED NOT DETECTED Final   Staphylococcus species DETECTED (A) NOT DETECTED Final    Comment: Methicillin (oxacillin) susceptible coagulase negative staphylococcus. Possible blood culture contaminant (unless isolated from more than one blood culture draw or clinical case suggests pathogenicity). No antibiotic treatment is indicated for blood  culture contaminants. CRITICAL RESULT CALLED TO, READ BACK BY AND VERIFIED WITH: J MILLEN PHARMD 08/06/19 1956 JDW    Staphylococcus aureus (BCID) NOT DETECTED NOT DETECTED Final   Methicillin resistance NOT DETECTED NOT DETECTED Final   Streptococcus species NOT DETECTED NOT DETECTED Final   Streptococcus agalactiae NOT DETECTED NOT DETECTED Final   Streptococcus pneumoniae NOT DETECTED NOT DETECTED Final   Streptococcus  pyogenes NOT DETECTED NOT DETECTED Final   Acinetobacter baumannii NOT DETECTED NOT DETECTED Final   Enterobacteriaceae species NOT DETECTED NOT DETECTED Final   Enterobacter cloacae complex NOT DETECTED NOT DETECTED Final   Escherichia coli NOT DETECTED NOT DETECTED Final   Klebsiella oxytoca NOT DETECTED NOT DETECTED Final   Klebsiella pneumoniae NOT DETECTED NOT DETECTED Final   Proteus species NOT DETECTED NOT DETECTED Final   Serratia marcescens NOT DETECTED NOT DETECTED Final   Haemophilus influenzae NOT DETECTED NOT DETECTED Final   Neisseria meningitidis NOT DETECTED NOT DETECTED Final   Pseudomonas aeruginosa NOT DETECTED NOT DETECTED Final   Candida albicans NOT DETECTED NOT DETECTED Final   Candida glabrata NOT DETECTED NOT DETECTED Final   Candida krusei NOT DETECTED NOT DETECTED Final   Candida parapsilosis NOT DETECTED NOT DETECTED Final   Candida tropicalis NOT DETECTED NOT DETECTED Final    Comment: Performed at [facility], [place]. [address], [place], [place] [address]  Culture, blood (routine x 2)     Status: None   Collection Time: 08/05/19  9:33 PM   Specimen: BLOOD LEFT FOREARM  Result Value Ref Range Status   Specimen Description BLOOD LEFT FOREARM  Final   Special Requests   Final    BOTTLES DRAWN AEROBIC AND ANAEROBIC Blood Culture adequate volume   Culture   Final    NO GROWTH 5 DAYS Performed  at [facility], [place] [address] [place], [place] [address]    Report Status 08/10/2019 FINAL  Final    Today   Subjective    Robert Molina today has no headache,no chest abdominal pain,no new weakness tingling or numbness, feels much better     Objective   Blood pressure (!) 140/92, pulse 75, temperature 98.6 F (37 C), temperature source Oral, resp. rate 16, height '5\' 11"'$  (1.803 m), weight 61.6 kg, SpO2 98 %.   Intake/Output Summary (Last 24 hours) at 08/11/2019 1109 Last data filed at 08/11/2019 0517 Gross per 24 hour  Intake 360 ml  Output  680 ml  Net -320 ml    Exam  Awake Alert,   No new F.N deficits, Normal affect [place].AT,PERRAL Supple Neck,No JVD, No cervical lymphadenopathy appriciated.  Symmetrical Chest wall movement, Good air movement bilaterally, CTAB RRR,No Gallops,Rubs or new Murmurs, No Parasternal Heave +ve B.Sounds, Abd Soft, Non tender, No organomegaly appriciated, No rebound -guarding or rigidity. No Cyanosis, Clubbing or edema, No new Rash or bruise   Data Review   CBC w Diff:  Lab Results  Component Value Date   WBC 3.9 (L) 08/10/2019   HGB 13.8 08/10/2019   HCT 41.0 08/10/2019   PLT 399 08/10/2019   LYMPHOPCT 17 08/10/2019   MONOPCT 19 08/10/2019   EOSPCT 0 08/10/2019   BASOPCT 0 08/10/2019    CMP:  Lab Results  Component Value Date   NA 135 08/10/2019   K 3.6 08/10/2019   CL 99 08/10/2019   CO2 26 08/10/2019   BUN 8 08/10/2019   CREATININE 0.85 08/10/2019   PROT 5.9 (L) 08/10/2019   ALBUMIN 2.5 (L) 08/10/2019   BILITOT 0.6 08/10/2019   ALKPHOS 34 (L) 08/10/2019   AST 19 08/10/2019   ALT 16 08/10/2019  .   Total Time in preparing paper work, data evaluation and todays exam - 62 minutes  Lala Lund M.D on 08/11/2019 at 11:09 AM  [facility]   Office  [phone]

## 2019-08-11 NOTE — Discharge Instructions (Signed)
Follow with Primary MD in 7 days   Get CBC, CMP, 2 view Chest X ray -  checked next visit within 1 week by Primary MD or SNF MD  Activity: As tolerated with Full fall precautions use walker/cane & assistance as needed  Disposition SNF  Diet: Heart Healthy    Special Instructions: If you have smoked or chewed Tobacco  in the last 2 yrs please stop smoking, stop any regular Alcohol  and or any Recreational drug use.  On your next visit with your primary care physician please Get Medicines reviewed and adjusted.  Please request your Prim.MD to go over all Hospital Tests and Procedure/Radiological results at the follow up, please get all Hospital records sent to your Prim MD by signing hospital release before you go home.  If you experience worsening of your admission symptoms, develop shortness of breath, life threatening emergency, suicidal or homicidal thoughts you must seek medical attention immediately by calling 911 or calling your MD immediately  if symptoms less severe.  You Must read complete instructions/literature along with all the possible adverse reactions/side effects for all the Medicines you take and that have been prescribed to you. Take any new Medicines after you have completely understood and accpet all the possible adverse reactions/side effects.

## 2019-08-11 NOTE — TOC Transition Note (Addendum)
Transition of Care [facility]) - CM/SW Discharge Note   Patient Details  Name: Robert Molina MRN: [id number] Date of Birth: [birth year]  Transition of Care [facility]) CM/SW Contact:  Benard Halsted, LCSW Phone Number: 08/11/2019, 1:02 PM   Clinical Narrative:    Patient will DC to: [place] Anticipated DC date: 08/11/19 Family notified: N/A Transport by: Corey Harold    Per MD patient ready for DC to [facility]. RN, patient, patient's family, and facility notified of DC. Discharge Summary and FL2 sent to facility. RN to call report prior to discharge [phone] Room 808P). DC packet on chart. Ambulance transport requested for patient.   CSW will sign off for now as social work intervention is no longer needed. Please consult [name] again if new needs arise.      Final next level of care: Skilled Nursing Facility Barriers to Discharge: No Barriers Identified   Patient Goals and CMS Choice Patient states their goals for this hospitalization and ongoing recovery are:: Rehab CMS Medicare.gov Compare Post Acute Care list provided to:: Patient Choice offered to / list presented to : Patient  Discharge Placement   Existing PASRR number confirmed : 08/11/19          Patient chooses bed at: [facility] Patient to be transferred to facility by: [place] Name of family member notified: No family per patient Patient and family notified of of transfer: 08/11/19  Discharge Plan and Services In-house Referral: Clinical Social Work   Post Acute Care Choice: [place]                               Social Determinants of Health (SDOH) Interventions     Readmission Risk Interventions Readmission Risk Prevention Plan 08/10/2019  Post Dischage Appt Complete  Medication Screening Complete  Transportation Screening Complete  Some recent data might be hidden

## 2019-08-11 NOTE — Progress Notes (Signed)
Pt currently leaving floor via EMS, attempted to call report x2 but no answer.

## 2019-08-11 NOTE — TOC Progression Note (Signed)
Transition of Care [facility]) - Progression Note    Patient Details  Name: Robert Molina MRN: [id number] Date of Birth: Nov 13, 1950  [facility]) CM/SW [place], LCSW Phone Number: 08/11/2019, 1:00 PM  Clinical Narrative:    Biochemist, clinical received for [place]: [id number] until 5/11.    Expected Discharge Plan: [place] Barriers to Discharge: Continued Medical Work up, Ship broker  Expected Discharge Plan and Services Expected Discharge Plan: [place] In-house Referral: Clinical Social Work   Post Acute Care Choice: [place] Living arrangements for the past 2 months: Apartment Expected Discharge Date: 08/11/19                                     Social Determinants of Health (SDOH) Interventions    Readmission Risk Interventions Readmission Risk Prevention Plan 08/10/2019  Post Dischage Appt Complete  Medication Screening Complete  Transportation Screening Complete  Some recent data might be hidden

## 2019-08-11 NOTE — TOC Progression Note (Signed)
Transition of Care [facility]) - Progression Note    Patient Details  Name: Robert Molina MRN: [id number] Date of Birth: July 22, 1950  Transition of Care [facility]) CM/SW [place], LCSW Phone Number: 08/11/2019, 1:05 PM  Clinical Narrative:    CSW made patient aware that [facility] him with COVID and that we are waiting on insurance approval.    Expected Discharge Plan: [place] Barriers to Discharge: No Barriers Identified  Expected Discharge Plan and Services Expected Discharge Plan: [place] In-house Referral: Clinical Social Work   Post Acute Care Choice: [place] Living arrangements for the past 2 months: Apartment Expected Discharge Date: 08/11/19                                     Social Determinants of Health (SDOH) Interventions    Readmission Risk Interventions Readmission Risk Prevention Plan 08/10/2019  Post Dischage Appt Complete  Medication Screening Complete  Transportation Screening Complete  Some recent data might be hidden

## 2019-08-11 NOTE — Progress Notes (Signed)
I have attempted to call [facility] for report three times since 1445 and I have not been able to get in touch with anybody.

## 2019-10-01 ENCOUNTER — Ambulatory Visit: Payer: Medicare Other | Admitting: Internal Medicine

## 2019-11-12 ENCOUNTER — Emergency Department (HOSPITAL_COMMUNITY)
Admission: EM | Admit: 2019-11-12 | Discharge: 2019-11-13 | Disposition: A | Discharge status: Home / Self Care | Payer: Medicare Other | Attending: Emergency Medicine | Admitting: Emergency Medicine

## 2019-11-12 ENCOUNTER — Encounter (HOSPITAL_COMMUNITY): Payer: Self-pay

## 2019-11-12 DIAGNOSIS — Y92007 Garden or yard of unspecified non-institutional (private) residence as the place of occurrence of the external cause: Secondary | ICD-10-CM | POA: Insufficient documentation

## 2019-11-12 DIAGNOSIS — I6782 Cerebral ischemia: Secondary | ICD-10-CM | POA: Diagnosis not present

## 2019-11-12 DIAGNOSIS — F17210 Nicotine dependence, cigarettes, uncomplicated: Secondary | ICD-10-CM | POA: Diagnosis not present

## 2019-11-12 DIAGNOSIS — W19XXXA Unspecified fall, initial encounter: Secondary | ICD-10-CM

## 2019-11-12 DIAGNOSIS — S4991XA Unspecified injury of right shoulder and upper arm, initial encounter: Secondary | ICD-10-CM | POA: Diagnosis not present

## 2019-11-12 DIAGNOSIS — W07XXXA Fall from chair, initial encounter: Secondary | ICD-10-CM | POA: Insufficient documentation

## 2019-11-12 DIAGNOSIS — S0993XA Unspecified injury of face, initial encounter: Secondary | ICD-10-CM | POA: Insufficient documentation

## 2019-11-12 DIAGNOSIS — Y999 Unspecified external cause status: Secondary | ICD-10-CM | POA: Insufficient documentation

## 2019-11-12 DIAGNOSIS — Y9389 Activity, other specified: Secondary | ICD-10-CM | POA: Diagnosis not present

## 2019-11-12 DIAGNOSIS — Z8616 Personal history of COVID-19: Secondary | ICD-10-CM | POA: Insufficient documentation

## 2019-11-12 NOTE — ED Triage Notes (Signed)
Pt BIB [facility] fall from lawn chair. Struck R side of face. Ambulatory, no LOC, no thinners

## 2019-11-13 ENCOUNTER — Emergency Department (HOSPITAL_COMMUNITY): Payer: Medicare Other

## 2019-11-13 DIAGNOSIS — S0993XA Unspecified injury of face, initial encounter: Secondary | ICD-10-CM | POA: Diagnosis not present

## 2019-11-13 LAB — COMPREHENSIVE METABOLIC PANEL
ALT: 20 U/L (ref 0–44)
AST: 33 U/L (ref 15–41)
Albumin: 4.7 g/dL (ref 3.5–5.0)
Alkaline Phosphatase: 51 U/L (ref 38–126)
Anion gap: 13 (ref 5–15)
BUN: 7 mg/dL — ABNORMAL LOW (ref 8–23)
CO2: 27 mmol/L (ref 22–32)
Calcium: 9.8 mg/dL (ref 8.9–10.3)
Chloride: 99 mmol/L (ref 98–111)
Creatinine, Ser: 0.78 mg/dL (ref 0.61–1.24)
GFR calc Af Amer: >60 mL/min (ref >60)
GFR calc non Af Amer: >60 mL/min (ref >60)
Glucose, Bld: 117 mg/dL — ABNORMAL HIGH (ref 70–99)
Potassium: 4.4 mmol/L (ref 3.5–5.1)
Sodium: 139 mmol/L (ref 135–145)
Total Bilirubin: 0.9 mg/dL (ref 0.3–1.2)
Total Protein: 8.4 g/dL — ABNORMAL HIGH (ref 6.5–8.1)

## 2019-11-13 LAB — CBC WITH DIFFERENTIAL/PLATELET
Abs Immature Granulocytes: 0.01 10*3/uL (ref 0.00–0.07)
Basophils Absolute: 0.1 10*3/uL (ref 0.0–0.1)
Basophils Relative: 2 %
Eosinophils Absolute: 0.1 10*3/uL (ref 0.0–0.5)
Eosinophils Relative: 2 %
HCT: 44.7 % (ref 39.0–52.0)
Hemoglobin: 14.7 g/dL (ref 13.0–17.0)
Immature Granulocytes: 0 %
Lymphocytes Relative: 35 %
Lymphs Abs: 1.2 10*3/uL (ref 0.7–4.0)
MCH: 31.2 pg (ref 26.0–34.0)
MCHC: 32.9 g/dL (ref 30.0–36.0)
MCV: 94.9 fL (ref 80.0–100.0)
Monocytes Absolute: 0.7 10*3/uL (ref 0.1–1.0)
Monocytes Relative: 21 %
Neutro Abs: 1.4 10*3/uL — ABNORMAL LOW (ref 1.7–7.7)
Neutrophils Relative %: 40 %
Platelets: 255 10*3/uL (ref 150–400)
RBC: 4.71 MIL/uL (ref 4.22–5.81)
RDW: 16.2 % — ABNORMAL HIGH (ref 11.5–15.5)
WBC: 3.4 10*3/uL — ABNORMAL LOW (ref 4.0–10.5)
nRBC: 0 % (ref 0.0–0.2)

## 2019-11-13 NOTE — ED Provider Notes (Signed)
[facility] EMERGENCY DEPARTMENT Provider Note   CSN: [id number] Arrival date & time: 11/12/19  1808     History Chief Complaint  Patient presents with  . Fall    Robert Molina is a 69 y.o. male with history of tobacco use and daily alcohol use, hypertension presents to ER for evaluation of fall.  Patient states he was sitting outside on a chair on the lawn for about 30 minutes and then fell over onto the grass hitting the right side of his face and right shoulder.  States he immediately got up on his own. Denies LOC.  States he has been fine since then.  Denies prodromal palpitations, light headedness, chest pain, shortness of breath.  Denies significant headache, vision changes, neck pain or extremity weakness or numbness since.  He drink "3 or 4 or 5" beers daily, last drink over 24 hours ago. Patient states he sometimes feels restless if he doesn't drink alcohol but denies history of seizures.  Denies bladder or bowel incontinence or tongue biting after fall yesterday.  No history of seizures. Patient denies any recent dehydration, vomiting, diarrhea.  Has been eating well.  Lives alone. states he was admitted in May for [place].  His wife also had COVID at that time and she passed away here in the hospital. Lives alone.      HPI     Past Medical History:  Diagnosis Date  . Alcohol abuse   . Pneumonia    hx of about  24yrs ago    Patient Active Problem List   Diagnosis Date Noted  . Hiccups   . Hyponatremia   . Metabolic encephalopathy   . Pneumonia due to COVID-19 virus 08/05/2019  . 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    Past Surgical History:  Procedure Laterality Date  . PAROTIDECTOMY Right 05/06/2013   Procedure: PAROTIDECTOMY;  Surgeon: Ruby Cola, MD;  Location: [facility];  Service: ENT;  Laterality: Right;  With Nerve Monitor  . RADICAL NECK DISSECTION Right 05/06/2013   Procedure: RADICAL NECK DISSECTION;  Surgeon: Ruby Cola, MD;  Location: [facility] OR;   Service: ENT;  Laterality: Right;       Family History  Problem Relation Age of Onset  . Diabetes type II Neg Hx     Social History   Tobacco Use  . Smoking status: Current Every Day Smoker    Packs/day: 1.00    Years: 44.00    Pack years: 44.00    Types: Cigarettes  . Smokeless tobacco: Never Used  Substance Use Topics  . Alcohol use: Yes    Comment: daily  1-2 beers  . Drug use: No    Home Medications Prior to Admission medications   Medication Sig Start Date End Date Taking? Authorizing Provider  carvedilol (COREG) 3.125 MG tablet Take 1 tablet (3.125 mg total) by mouth 2 (two) times daily. 08/11/19 08/10/20  Thurnell Lose, MD  folic acid (FOLVITE) 1 MG tablet Take 1 tablet (1 mg total) by mouth daily. 08/12/19   Thurnell Lose, MD  pantoprazole (PROTONIX) 40 MG tablet Take 1 tablet (40 mg total) by mouth daily. 08/12/19   Thurnell Lose, MD  tamsulosin (FLOMAX) 0.4 MG CAPS capsule Take 1 capsule (0.4 mg total) by mouth daily. 08/12/19   Thurnell Lose, MD  thiamine 100 MG tablet Take 1 tablet (100 mg total) by mouth daily. 08/12/19   Thurnell Lose, MD    Allergies    Patient has no  known allergies.  Review of Systems   Review of Systems  All other systems reviewed and are negative.   Physical Exam Updated Vital Signs BP (!) 190/98 (BP Location: Right Arm)   Pulse (!) 56   Temp 98.1 F (36.7 C) (Oral)   Resp 15   Ht 5\' 11"  (1.803 m)   Wt 62 kg   SpO2 100%   BMI 19.06 kg/m   Physical Exam Vitals and nursing note reviewed.  Constitutional:      General: He is not in acute distress.    Appearance: He is well-developed.     Comments: NAD.  HENT:     Head: Normocephalic and atraumatic.     Comments: No facial trauma or tenderness     Right Ear: External ear normal.     Left Ear: External ear normal.     Nose: Nose normal.     Mouth/Throat:     Comments: No intraoral tongue injury  Eyes:     General: No scleral icterus.    Conjunctiva/sclera:  Conjunctivae normal.  Neck:     Comments: No midline or paraspinal neck tenderness  Cardiovascular:     Rate and Rhythm: Normal rate and regular rhythm.     Heart sounds: Normal heart sounds.  Pulmonary:     Effort: Pulmonary effort is normal.     Breath sounds: Normal breath sounds.  Musculoskeletal:        General: No deformity. Normal range of motion.     Cervical back: Normal range of motion and neck supple.     Comments: Right shoulder: no focal tenderness. Full ROM without pain  Skin:    General: Skin is warm and dry.     Capillary Refill: Capillary refill takes less than 2 seconds.  Neurological:     Mental Status: He is alert and oriented to person, place, and time.     Comments:   Mental Status: Patient is awake, alert, oriented to person, place, year, and situation. Patient is able to give a clear and coherent history. Speech is fluent and clear without dysarthria or aphasia. No signs of neglect.  Cranial Nerves: I not tested II visual fields full bilaterally. PERRL.  Unable to visualize posterior eye. III, IV, VI EOMs intact without ptosis or diplopia  V sensation to light touch intact in all 3 divisions of trigeminal nerve bilaterally  VII facial movements symmetric bilaterally VIII hearing intact to voice/conversation  IX, X no uvula deviation, symmetric rise of soft palate/uvula XI 5/5 SCM and trapezius strength bilaterally  XII tongue protrusion midline, symmetric L/R movements  Motor:  Strength 5/5 in upper/lower extremities.  Sensation to light touch intact in face, upper/lower extremities. No pronator drift. No leg drop.  Cerebellar: No ataxia with finger to nose. Patient ambulated down hall without ataxia, light headedness    Psychiatric:        Behavior: Behavior normal.        Thought Content: Thought content normal.        Judgment: Judgment normal.     ED Results / Procedures / Treatments   Labs (all labs ordered are listed, but only abnormal  results are displayed) Labs Reviewed  CBC WITH DIFFERENTIAL/PLATELET - Abnormal; Notable for the following components:      Result Value   WBC 3.4 (*)    RDW 16.2 (*)    Neutro Abs 1.4 (*)    All other components within normal limits  COMPREHENSIVE METABOLIC PANEL - Abnormal;  Notable for the following components:   Glucose, Bld 117 (*)    BUN 7 (*)    Total Protein 8.4 (*)    All other components within normal limits    EKG EKG Interpretation  Date/Time:  Friday November 13 2019 09:16:05 EDT Ventricular Rate:  54 PR Interval:  180 QRS Duration: 94 QT Interval:  450 QTC Calculation: 426 R Axis:   79 Text Interpretation: Sinus bradycardia Otherwise normal ECG Confirmed by Dewaine Conger [phone]) on 11/13/2019 9:25:17 AM   Radiology CT Head Wo Contrast  Result Date: 11/13/2019 CLINICAL DATA:  Unexplained fall EXAM: CT HEAD WITHOUT CONTRAST TECHNIQUE: Contiguous axial images were obtained from the base of the skull through the vertex without intravenous contrast. COMPARISON:  None. FINDINGS: Brain: There is no acute intracranial hemorrhage, mass effect, or edema. Gray-white differentiation is preserved. There is no extra-axial fluid collection. Ventricles and sulci are within normal limits in size and configuration. Patchy hypoattenuation in the supratentorial white matter is nonspecific may reflect mild chronic microvascular ischemic changes. Vascular: There is atherosclerotic calcification at the skull base. Skull: Calvarium is unremarkable. Sinuses/Orbits: No acute finding. Other: None. IMPRESSION: No evidence of acute intracranial injury. Chronic microvascular ischemic changes. Electronically Signed   By: Macy Mis M.D.   On: 11/13/2019 09:22    Procedures Procedures (including critical care time)  Medications Ordered in ED Medications - No data to display  ED Course  I have reviewed the triage vital signs and the nursing notes.  Pertinent labs & imaging results that were  available during my care of the patient were reviewed by me and considered in my medical decision making (see chart for details).  Clinical Course as of Nov 13 1106  Fri Nov 13, 2019  0947 IMPRESSION: No evidence of acute intracranial injury. Chronic microvascular ischemic changes.   CT Head Wo Contrast [CG]  0948 Sinus bradycardia   EKG 12-Lead [CG]    Clinical Course User Index [CG] Arlean Hopping   MDM Rules/Calculators/A&P                         Available medical records, triage notes and nursing notes reviewed to obtain more history and assist with MDM.  I reviewed patient's recent hospitalization on 08/3612 for metabolic encephalopathy suspected to be from fever.  He was diagnosed with Covid pneumonia that led to acute hypoxic respiratory failure.    I have ordered lab work, EKG.  Given history of alcohol use CT head obtained.  ER work-up personally visualized and interpreted, benign.  CT nonacute.  EKG shows sinus bradycardia HR 54.  Heart rate here has been greater than 60.  Hypertensive however patient has not taken his blood pressure medicine since arrival to the ED more than 15 hours ago.  Normal hemoglobin.  Normal electrolytes.  Physical exam is benign, no signs of significant facial or musculoskeletal trauma from the fall yesterday.  No intraoral tongue injury.  Cause of fall unclear at this time.  Considered side effect of beta-blocker, especially if patient went from sitting to standing and possible hypotension/bradycardia.  He had no prodromal cardiac symptoms and any other cardiac rhythm is less likely.  Normal neuro exam and presentation is not very classic of TIA, CVA.  Seizure is possible but less likely given his history of alcohol use however he has no history of this.  Patient shared with EDP.  He is appropriate for discharge.  Concern for some degree  of poor medical literacy.  Patient lives alone and has no PCP.  I have consulted case management who  will assist patient in finding PCP appointment for close follow-up.  May need BP medicine adjusted, near syncope/syncope work up as OP. Return precautions discussed with patient at length and he verbalized understanding.  Urged alcohol cessation.  Final Clinical Impression(s) / ED Diagnoses Final diagnoses:  Fall, initial encounter    Rx / DC Orders ED Discharge Orders    None       Arlean Hopping 11/13/19 1108    Breck Coons, MD 11/14/19 2004

## 2019-11-13 NOTE — Discharge Planning (Signed)
Transition of Care [facility]) - Emergency Department Mini Assessment   Patient Details  Name: STORM SOVINE MRN: [id number] Date of Birth: 03/05/1951  Transition [facility] Care [facility]) CM/SW Contact:    Fuller Mandril, RN Phone Number: 11/13/2019, 10:57 AM   Clinical Narrative: Jason Coop. Clydene Laming, RN, [place], [place]  RNCM set up appointment with [place] [facility] on 9/16 @ 1:30.  Spoke with pt at bedside and advised to please arrive 15 min early and take a picture ID and your current medications.  Pt verbalizes understanding of keeping appointment.  Pt expressed that he did not have transportation home.  RNCM provided bus pass.    ED Mini Assessment: What brought you to the Emergency Department? : Fall  Barriers to Discharge: ED PCP establishment  Barrier interventions: PCP appointment  Means of departure: Public Transportation  Interventions which prevented an admission or readmission: Medication Review, Follow-up medical appointment    Patient Contact and Communications        ,                 Admission diagnosis:  fall Patient Active Problem List   Diagnosis Date Noted  . Hiccups   . Hyponatremia   . Metabolic encephalopathy   . Pneumonia due to COVID-19 virus 08/05/2019  . 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   PCP:  Patient, No Pcp Per Pharmacy:   [id number] [address] [place], [place] [address] [place] [address] Phone: [phone] Fax: [phone]

## 2019-11-13 NOTE — Discharge Instructions (Addendum)
You came to the ER for a fall  Cause of fall is unclear.  Everything we did today looked normal  Please follow up with general doctor. You will need re-evaluation and they may adjust your blood pressure medicine and outpatient work up  Return to ER for evaluation of light headedness, recurrent passing out, chest pain, shortness of breath

## 2019-12-24 ENCOUNTER — Encounter (INDEPENDENT_AMBULATORY_CARE_PROVIDER_SITE_OTHER): Payer: Self-pay

## 2019-12-24 ENCOUNTER — Ambulatory Visit (INDEPENDENT_AMBULATORY_CARE_PROVIDER_SITE_OTHER): Payer: Medicare Other | Admitting: Primary Care

## 2019-12-24 ENCOUNTER — Other Ambulatory Visit: Payer: Self-pay

## 2019-12-24 ENCOUNTER — Encounter (INDEPENDENT_AMBULATORY_CARE_PROVIDER_SITE_OTHER): Payer: Self-pay | Admitting: Primary Care

## 2019-12-24 VITALS — BP 164/105 | HR 76 | Temp 98.4°F | Ht 71.0 in | Wt 137.2 lb

## 2019-12-24 DIAGNOSIS — Z23 Encounter for immunization: Secondary | ICD-10-CM | POA: Diagnosis not present

## 2019-12-24 DIAGNOSIS — Z09 Encounter for follow-up examination after completed treatment for conditions other than malignant neoplasm: Secondary | ICD-10-CM | POA: Diagnosis not present

## 2019-12-24 DIAGNOSIS — I10 Essential (primary) hypertension: Secondary | ICD-10-CM

## 2019-12-24 DIAGNOSIS — Z7689 Persons encountering health services in other specified circumstances: Secondary | ICD-10-CM

## 2019-12-24 DIAGNOSIS — F1010 Alcohol abuse, uncomplicated: Secondary | ICD-10-CM

## 2019-12-24 DIAGNOSIS — N401 Enlarged prostate with lower urinary tract symptoms: Secondary | ICD-10-CM

## 2019-12-24 MED ORDER — THIAMINE HCL 100 MG PO TABS: 100.0000 mg | ORAL_TABLET | Freq: Every day | ORAL | 1 refills | Status: DC

## 2019-12-24 MED ORDER — TAMSULOSIN HCL 0.4 MG PO CAPS: 0.4000 mg | ORAL_CAPSULE | Freq: Every day | ORAL | 1 refills | Status: DC

## 2019-12-24 MED ORDER — HYDROCHLOROTHIAZIDE 25 MG PO TABS: 25.0000 mg | ORAL_TABLET | Freq: Every day | ORAL | 3 refills | Status: DC

## 2019-12-24 MED ORDER — AMLODIPINE BESYLATE 10 MG PO TABS: 10.0000 mg | ORAL_TABLET | Freq: Every day | ORAL | 3 refills | Status: DC

## 2019-12-24 NOTE — Patient Instructions (Signed)
https://www.cdc.gov/vaccines/hcp/vis/vis-statements/tdap.pdf">  Tdap (Tetanus, Diphtheria, Pertussis) Vaccine: What You Need to Know 1. Why get vaccinated? Tdap vaccine can prevent tetanus, diphtheria, and pertussis. Diphtheria and pertussis spread from person to person. Tetanus enters the body through cuts or wounds.  TETANUS (T) causes painful stiffening of the muscles. Tetanus can lead to serious health problems, including being unable to open the mouth, having trouble swallowing and breathing, or death.  DIPHTHERIA (D) can lead to difficulty breathing, heart failure, paralysis, or death.  PERTUSSIS (aP), also known as "whooping cough," can cause uncontrollable, violent coughing which makes it hard to breathe, eat, or drink. Pertussis can be extremely serious in babies and young children, causing pneumonia, convulsions, brain damage, or death. In teens and adults, it can cause weight loss, loss of bladder control, passing out, and rib fractures from severe coughing. 2. Tdap vaccine Tdap is only for children 7 years and older, adolescents, and adults.  Adolescents should receive a single dose of Tdap, preferably at age 53 or 35 years. Pregnant women should get a dose of Tdap during every pregnancy, to protect the newborn from pertussis. Infants are most at risk for severe, life-threatening complications from pertussis. Adults who have never received Tdap should get a dose of Tdap. Also, adults should receive a booster dose every 10 years, or earlier in the case of a severe and dirty wound or burn. Booster doses can be either Tdap or Td (a different vaccine that protects against tetanus and diphtheria but not pertussis). Tdap may be given at the same time as other vaccines. 3. Talk with your health care provider Tell your vaccine provider if the person getting the vaccine:  Has had an allergic reaction after a previous dose of any vaccine that protects against tetanus, diphtheria, or pertussis,  or has any severe, life-threatening allergies.  Has had a coma, decreased level of consciousness, or prolonged seizures within 7 days after a previous dose of any pertussis vaccine (DTP, DTaP, or Tdap).  Has seizures or another nervous system problem.  Has ever had Guillain-Barr Syndrome (also called GBS).  Has had severe pain or swelling after a previous dose of any vaccine that protects against tetanus or diphtheria. In some cases, your health care provider may decide to postpone Tdap vaccination to a future visit.  People with minor illnesses, such as a cold, may be vaccinated. People who are moderately or severely ill should usually wait until they recover before getting Tdap vaccine.  Your health care provider can give you more information. 4. Risks of a vaccine reaction  Pain, redness, or swelling where the shot was given, mild fever, headache, feeling tired, and nausea, vomiting, diarrhea, or stomachache sometimes happen after Tdap vaccine. People sometimes faint after medical procedures, including vaccination. Tell your provider if you feel dizzy or have vision changes or ringing in the ears.  As with any medicine, there is a very remote chance of a vaccine causing a severe allergic reaction, other serious injury, or death. 5. What if there is a serious problem? An allergic reaction could occur after the vaccinated person leaves the clinic. If you see signs of a severe allergic reaction (hives, swelling of the face and throat, difficulty breathing, a fast heartbeat, dizziness, or weakness), call 9-1-1 and get the person to the nearest hospital. For other signs that concern you, call your health care provider.  Adverse reactions should be reported to the Vaccine Adverse Event Reporting System (VAERS). Your health care provider will usually file this report,  or you can do it yourself. Visit the VAERS website at www.vaers.SamedayNews.es or call (816)266-5606. VAERS is only for reporting  reactions, and VAERS staff do not give medical advice. 6. The National Vaccine Injury Compensation Program The [employer] Vaccine Injury Compensation Program (VICP) is a federal program that was created to compensate people who may have been injured by certain vaccines. Visit the VICP website at GoldCloset.com.ee or call 847-088-0378 to learn about the program and about filing a claim. There is a time limit to file a claim for compensation. 7. How can I learn more?  Ask your health care provider.  Call your local or state health department.  Contact the Centers for Disease Control and Prevention (CDC): ? Call 224-436-2351 (1-800-CDC-INFO) or ? Visit CDC's website at http://hunter.com/ Vaccine Information Statement Tdap (Tetanus, Diphtheria, Pertussis) Vaccine (07/09/2018) This information is not intended to replace advice given to you by your health care provider. Make sure you discuss any questions you have with your health care provider. Document Revised: 07/18/2018 Document Reviewed: 07/21/2018 Elsevier Patient Education  2020 [employer]. Influenza, Adult Influenza is also called "the flu." It is an infection in the lungs, nose, and throat (respiratory tract). It is caused by a virus. The flu causes symptoms that are similar to symptoms of a cold. It also causes a high fever and body aches. The flu spreads easily from person to person (is contagious). Getting a flu shot (influenza vaccination) every year is the best way to prevent the flu. What are the causes? This condition is caused by the influenza virus. You can get the virus by:  Breathing in droplets that are in the air from the cough or sneeze of a person who has the virus.  Touching something that has the virus on it (is contaminated) and then touching your mouth, nose, or eyes. What increases the risk? Certain things may make you more likely to get the flu. These include:  Not washing your hands  often.  Having close contact with many people during cold and flu season.  Touching your mouth, eyes, or nose without first washing your hands.  Not getting a flu shot every year. You may have a higher risk for the flu, along with serious problems such as a lung infection (pneumonia), if you:  Are older than 65.  Are pregnant.  Have a weakened disease-fighting system (immune system) because of a disease or taking certain medicines.  Have a long-term (chronic) illness, such as: ? Heart, kidney, or lung disease. ? Diabetes. ? Asthma.  Have a liver disorder.  Are very overweight (morbidly obese).  Have anemia. This is a condition that affects your red blood cells. What are the signs or symptoms? Symptoms usually begin suddenly and last 4-14 days. They may include:  Fever and chills.  Headaches, body aches, or muscle aches.  Sore throat.  Cough.  Runny or stuffy (congested) nose.  Chest discomfort.  Not wanting to eat as much as normal (poor appetite).  Weakness or feeling tired (fatigue).  Dizziness.  Feeling sick to your stomach (nauseous) or throwing up (vomiting). How is this treated? If the flu is found early, you can be treated with medicine that can help reduce how bad the illness is and how long it lasts (antiviral medicine). This may be given by mouth (orally) or through an IV tube. Taking care of yourself at home can help your symptoms get better. Your doctor may suggest:  Taking over-the-counter medicines.  Drinking plenty of fluids. The  flu often goes away on its own. If you have very bad symptoms or other problems, you may be treated in a hospital. Follow these instructions at home:     Activity  Rest as needed. Get plenty of sleep.  Stay home from work or school as told by your doctor. ? Do not leave home until you do not have a fever for 24 hours without taking medicine. ? Leave home only to visit your doctor. Eating and drinking  Take an  ORS (oral rehydration solution). This is a drink that is sold at pharmacies and stores.  Drink enough fluid to keep your pee (urine) pale yellow.  Drink clear fluids in small amounts as you are able. Clear fluids include: ? Water. ? Ice chips. ? Fruit juice that has water added (diluted fruit juice). ? Low-calorie sports drinks.  Eat bland, easy-to-digest foods in small amounts as you are able. These foods include: ? Bananas. ? Applesauce. ? Rice. ? Lean meats. ? Toast. ? Crackers.  Do not eat or drink: ? Fluids that have a lot of sugar or caffeine. ? Alcohol. ? Spicy or fatty foods. General instructions  Take over-the-counter and prescription medicines only as told by your doctor.  Use a cool mist humidifier to add moisture to the air in your home. This can make it easier for you to breathe.  Cover your mouth and nose when you cough or sneeze.  Wash your hands with soap and water often, especially after you cough or sneeze. If you cannot use soap and water, use alcohol-based hand sanitizer.  Keep all follow-up visits as told by your doctor. This is important. How is this prevented?   Get a flu shot every year. You may get the flu shot in late summer, fall, or winter. Ask your doctor when you should get your flu shot.  Avoid contact with people who are sick during fall and winter (cold and flu season). Contact a doctor if:  You get new symptoms.  You have: ? Chest pain. ? Watery poop (diarrhea). ? A fever.  Your cough gets worse.  You start to have more mucus.  You feel sick to your stomach.  You throw up. Get help right away if you:  Have shortness of breath.  Have trouble breathing.  Have skin or nails that turn a bluish color.  Have very bad pain or stiffness in your neck.  Get a sudden headache.  Get sudden pain in your face or ear.  Cannot eat or drink without throwing up. Summary  Influenza ("the flu") is an infection in the lungs, nose,  and throat. It is caused by a virus.  Take over-the-counter and prescription medicines only as told by your doctor.  Getting a flu shot every year is the best way to avoid getting the flu. This information is not intended to replace advice given to you by your health care provider. Make sure you discuss any questions you have with your health care provider. Document Revised: 09/11/2017 Document Reviewed: 09/11/2017 Elsevier Patient Education  [place].

## 2019-12-24 NOTE — Progress Notes (Signed)
Assessment and Plan:    HPI 69 y.o.male presents for follow up from the hospital. Admit date to the hospital was 11/12/19, patient was discharged from the hospital on 11/13/19, patient was admitted for: Fall-ER for evaluation .He drink "3 or 4 or 5" beers daily, last drink over 24 hours ago  Past Medical History:  Diagnosis Date  . Alcohol abuse   . Pneumonia    hx of about  59yrs ago     No Known Allergies    Current Outpatient Medications on File Prior to Visit  Medication Sig Dispense Refill  . carvedilol (COREG) 3.125 MG tablet Take 1 tablet (3.125 mg total) by mouth 2 (two) times daily. 60 tablet 11  . folic acid (FOLVITE) 1 MG tablet Take 1 tablet (1 mg total) by mouth daily. (Patient not taking: Reported on 12/24/2019)    . pantoprazole (PROTONIX) 40 MG tablet Take 1 tablet (40 mg total) by mouth daily. (Patient not taking: Reported on 12/24/2019)    . tamsulosin (FLOMAX) 0.4 MG CAPS capsule Take 1 capsule (0.4 mg total) by mouth daily. (Patient not taking: Reported on 12/24/2019) 30 capsule   . thiamine 100 MG tablet Take 1 tablet (100 mg total) by mouth daily. (Patient not taking: Reported on 12/24/2019)     No current facility-administered medications on file prior to visit.    ROS: all negative except above.   Physical Exam: Filed Weights   12/24/19 1420  Weight: 137 lb 3.2 oz (62.2 kg)   BP (!) 171/111 (BP Location: Right Arm, Patient Position: Sitting, Cuff Size: Normal)   Pulse 86   Temp 98.4 F (36.9 C) (Temporal)   Ht 5\' 11"  (1.803 m)   Wt 137 lb 3.2 oz (62.2 kg)   SpO2 96%   BMI 19.14 kg/m  General Appearance: Well nourished, in no apparent distress. Eyes: PERRLA, EOMs, conjunctiva no swelling or erythema Sinuses: No Frontal/maxillary tenderness ENT/Mouth: Hearing normal.  Neck: Supple, thyroid normal.  Respiratory: Respiratory effort normal, BS equal bilaterally without rales, rhonchi, wheezing or stridor.  Cardio: RRR with no MRGs. Brisk peripheral pulses  without edema.  Abdomen: Soft, + BS.  Non tender, no guarding, rebound, hernias, masses. Lymphatics: Non tender without lymphadenopathy.  Musculoskeletal: Full ROM, 5/5 strength, normal gait.  Skin: Warm, dry without rashes, lesions, ecchymosis.  Neuro: Cranial nerves intact. Normal muscle tone, no cerebellar symptoms. Sensation intact.  Psych: Awake and oriented X 3, normal affect, Insight and Judgment appropriate.    Robert Molina was seen today for new patient (initial visit) and medication refill.  Diagnoses and all orders for this visit:  Encounter to establish care Robert Mire, NP-C will be your  (PCP) she is mastered prepared . She is skilled to diagnosed and treat illness. Also able to answer health concern as well as continuing care of varied medical conditions, not limited by cause, organ system, or diagnosis.   Need for Tdap vaccination -     Tdap vaccine greater than or equal to 7yo IM  Hospital discharge follow-up Fall - CT nonacute.  EKG shows sinus bradycardia HR 54.  Heart rate here has been greater than 60. Unknown etiology ( underlying cause question alcohol )  Essential hypertension Counseled on blood pressure goal of less than 130/80, low-sodium, DASH diet, medication compliance, 150 minutes of moderate intensity exercise per week. Discussed medication compliance, adverse effects. -     amLODipine (NORVASC) 10 MG tablet; Take 1 tablet (10 mg total) by mouth daily. -  hydrochlorothiazide (HYDRODIURIL) 25 MG tablet; Take 1 tablet (25 mg total) by mouth daily.  Alcohol abuse  Benign prostatic hyperplasia with lower urinary tract symptoms, symptom details unspecified -     tamsulosin (FLOMAX) 0.4 MG CAPS capsule; Take 1 capsule (0.4 mg total) by mouth daily.  Need for immunization against influenza -     Flu Vaccine QUAD 36+ mos IM  Other orders -     thiamine 100 MG tablet; Take 1 tablet (100 mg total) by mouth daily.   Kerin Perna, NP 2:33 PM

## 2020-01-14 ENCOUNTER — Ambulatory Visit (INDEPENDENT_AMBULATORY_CARE_PROVIDER_SITE_OTHER): Payer: Medicare Other | Admitting: Primary Care

## 2020-01-14 ENCOUNTER — Encounter (INDEPENDENT_AMBULATORY_CARE_PROVIDER_SITE_OTHER): Payer: Self-pay | Admitting: Primary Care

## 2020-01-14 ENCOUNTER — Other Ambulatory Visit: Payer: Self-pay

## 2020-01-14 VITALS — BP 146/88 | HR 98 | Temp 97.2°F | Ht 71.0 in

## 2020-01-14 DIAGNOSIS — Z23 Encounter for immunization: Secondary | ICD-10-CM

## 2020-01-14 DIAGNOSIS — F1010 Alcohol abuse, uncomplicated: Secondary | ICD-10-CM | POA: Diagnosis not present

## 2020-01-14 DIAGNOSIS — I10 Essential (primary) hypertension: Secondary | ICD-10-CM | POA: Diagnosis not present

## 2020-01-14 DIAGNOSIS — Z1211 Encounter for screening for malignant neoplasm of colon: Secondary | ICD-10-CM | POA: Diagnosis not present

## 2020-01-14 NOTE — Patient Instructions (Signed)
Patient taking carvedilol 3.125 once daily and directions is twice daily. Refills remaining on carvedilol he will pick up medication and start taking twice daily.  Other blood pressure medications hydrochlorothiazide 25 mg once daily, amlodipine 10 mg once daily and tamsulosin 0.4mg  Hypertension, Adult Hypertension is another name for high blood pressure. High blood pressure forces your heart to work harder to pump blood. This can cause problems over time. There are two numbers in a blood pressure reading. There is a top number (systolic) over a bottom number (diastolic). It is best to have a blood pressure that is below 120/80. Healthy choices can help lower your blood pressure, or you may need medicine to help lower it. What are the causes? The cause of this condition is not known. Some conditions may be related to high blood pressure. What increases the risk?  Smoking.  Having type 2 diabetes mellitus, high cholesterol, or both.  Not getting enough exercise or physical activity.  Being overweight.  Having too much fat, sugar, calories, or salt (sodium) in your diet.  Drinking too much alcohol.  Having long-term (chronic) kidney disease.  Having a family history of high blood pressure.  Age. Risk increases with age.  Race. You may be at higher risk if you are African American.  Gender. Men are at higher risk than women before age 52. After age 40, women are at higher risk than men.  Having obstructive sleep apnea.  Stress. What are the signs or symptoms?  High blood pressure may not cause symptoms. Very high blood pressure (hypertensive crisis) may cause: ? Headache. ? Feelings of worry or nervousness (anxiety). ? Shortness of breath. ? Nosebleed. ? A feeling of being sick to your stomach (nausea). ? Throwing up (vomiting). ? Changes in how you see. ? Very bad chest pain. ? Seizures. How is this treated?  This condition is treated by making healthy lifestyle changes,  such as: ? Eating healthy foods. ? Exercising more. ? Drinking less alcohol.  Your health care provider may prescribe medicine if lifestyle changes are not enough to get your blood pressure under control, and if: ? Your top number is above 130. ? Your bottom number is above 80.  Your personal target blood pressure may vary. Follow these instructions at home: Eating and drinking   If told, follow the DASH eating plan. To follow this plan: ? Fill one half of your plate at each meal with fruits and vegetables. ? Fill one fourth of your plate at each meal with whole grains. Whole grains include whole-wheat pasta, brown rice, and whole-grain bread. ? Eat or drink low-fat dairy products, such as skim milk or low-fat yogurt. ? Fill one fourth of your plate at each meal with low-fat (lean) proteins. Low-fat proteins include fish, chicken without skin, eggs, beans, and tofu. ? Avoid fatty meat, cured and processed meat, or chicken with skin. ? Avoid pre-made or processed food.  Eat less than 1,500 mg of salt each day.  Do not drink alcohol if: ? Your doctor tells you not to drink. ? You are pregnant, may be pregnant, or are planning to become pregnant.  If you drink alcohol: ? Limit how much you use to:  0-1 drink a day for women.  0-2 drinks a day for men. ? Be aware of how much alcohol is in your drink. In the [place] one drink equals one 12 oz bottle of beer (355 mL), one 5 oz glass of wine (148 mL), or one 1  oz glass of hard liquor (44 mL). Lifestyle   Work with your doctor to stay at a healthy weight or to lose weight. Ask your doctor what the best weight is for you.  Get at least 30 minutes of exercise most days of the week. This may include walking, swimming, or biking.  Get at least 30 minutes of exercise that strengthens your muscles (resistance exercise) at least 3 days a week. This may include lifting weights or doing Pilates.  Do not use any products that contain  nicotine or tobacco, such as cigarettes, e-cigarettes, and chewing tobacco. If you need help quitting, ask your doctor.  Check your blood pressure at home as told by your doctor.  Keep all follow-up visits as told by your doctor. This is important. Medicines  Take over-the-counter and prescription medicines only as told by your doctor. Follow directions carefully.  Do not skip doses of blood pressure medicine. The medicine does not work as well if you skip doses. Skipping doses also puts you at risk for problems.  Ask your doctor about side effects or reactions to medicines that you should watch for. Contact a doctor if you:  Think you are having a reaction to the medicine you are taking.  Have headaches that keep coming back (recurring).  Feel dizzy.  Have swelling in your ankles.  Have trouble with your vision. Get help right away if you:  Get a very bad headache.  Start to feel mixed up (confused).  Feel weak or numb.  Feel faint.  Have very bad pain in your: ? Chest. ? Belly (abdomen).  Throw up more than once.  Have trouble breathing. Summary  Hypertension is another name for high blood pressure.  High blood pressure forces your heart to work harder to pump blood.  For most people, a normal blood pressure is less than 120/80.  Making healthy choices can help lower blood pressure. If your blood pressure does not get lower with healthy choices, you may need to take medicine. This information is not intended to replace advice given to you by your health care provider. Make sure you discuss any questions you have with your health care provider. Document Revised: 12/04/2017 Document Reviewed: 12/04/2017 Elsevier Patient Education  2020 [employer].  can lower blood pressure and to take at night for urinary problems

## 2020-01-14 NOTE — Progress Notes (Signed)
Established Patient Office Visit  Subjective:  Patient ID: Robert Molina, male    DOB: 19-Aug-1950  Age: 69 y.o. MRN: [id number]  CC:  Chief Complaint  Patient presents with  . Blood Pressure Check    HPI Robert Molina is a 69 year old male who presents for blood pressure recheck.  Reviewed medication with patient and was taking carvedilol once daily and prescribed twice daily. Counseled on all medication what they were for and how many times a day to take them.  He admits he continues to drink several beers a day and smokes 3 to 5 cigarettes a day.  Requesting medication refills which is already available.  Past Medical History:  Diagnosis Date  . Alcohol abuse   . Pneumonia    hx of about  16yrs ago    Past Surgical History:  Procedure Laterality Date  . PAROTIDECTOMY Right 05/06/2013   Procedure: PAROTIDECTOMY;  Surgeon: Ruby Cola, MD;  Location: [facility];  Service: ENT;  Laterality: Right;  With Nerve Monitor  . RADICAL NECK DISSECTION Right 05/06/2013   Procedure: RADICAL NECK DISSECTION;  Surgeon: Ruby Cola, MD;  Location: [facility] OR;  Service: ENT;  Laterality: Right;    Family History  Problem Relation Age of Onset  . Diabetes type II Neg Hx     Social History   Socioeconomic History  . Marital status: Single    Spouse name: Not on file  . Number of children: Not on file  . Years of education: Not on file  . Highest education level: Not on file  Occupational History  . Not on file  Tobacco Use  . Smoking status: Current Every Day Smoker    Packs/day: 1.00    Years: 44.00    Pack years: 44.00    Types: Cigarettes  . Smokeless tobacco: Never Used  Substance and Sexual Activity  . Alcohol use: Yes    Comment: daily  1-2 beers  . Drug use: No  . Sexual activity: Never  Other Topics Concern  . Not on file  Social History Narrative  . Not on file   Social Determinants of Health   Financial Resource Strain:   . Difficulty of Paying Living  Expenses: Not on file  Food Insecurity:   . Worried About Charity fundraiser in the Last Year: Not on file  . Ran Out of Food in the Last Year: Not on file  Transportation Needs:   . Lack of Transportation (Medical): Not on file  . Lack of Transportation (Non-Medical): Not on file  Physical Activity:   . Days of Exercise per Week: Not on file  . Minutes of Exercise per Session: Not on file  Stress:   . Feeling of Stress : Not on file  Social Connections:   . Frequency of Communication with Friends and Family: Not on file  . Frequency of Social Gatherings with Friends and Family: Not on file  . Attends Religious Services: Not on file  . Active Member of Clubs or Organizations: Not on file  . Attends Archivist Meetings: Not on file  . Marital Status: Not on file  Intimate Partner Violence:   . Fear of Current or Ex-Partner: Not on file  . Emotionally Abused: Not on file  . Physically Abused: Not on file  . Sexually Abused: Not on file    Outpatient Medications Prior to Visit  Medication Sig Dispense Refill  . amLODipine (NORVASC) 10 MG tablet Take 1 tablet (  10 mg total) by mouth daily. 90 tablet 3  . carvedilol (COREG) 3.125 MG tablet Take 1 tablet (3.125 mg total) by mouth 2 (two) times daily. 60 tablet 11  . hydrochlorothiazide (HYDRODIURIL) 25 MG tablet Take 1 tablet (25 mg total) by mouth daily. 90 tablet 3  . tamsulosin (FLOMAX) 0.4 MG CAPS capsule Take 1 capsule (0.4 mg total) by mouth daily. 90 capsule 1  . thiamine 100 MG tablet Take 1 tablet (100 mg total) by mouth daily. 90 tablet 1  . folic acid (FOLVITE) 1 MG tablet Take 1 tablet (1 mg total) by mouth daily. (Patient not taking: Reported on 12/24/2019)    . pantoprazole (PROTONIX) 40 MG tablet Take 1 tablet (40 mg total) by mouth daily. (Patient not taking: Reported on 12/24/2019)     No facility-administered medications prior to visit.    No Known Allergies  ROS Review of Systems  Neurological: Positive  for dizziness.  All other systems reviewed and are negative.     Objective:    Physical Exam Vitals reviewed.  Constitutional:      Appearance: He is normal weight.  HENT:     Head: Normocephalic.     Right Ear: Tympanic membrane normal.     Left Ear: Tympanic membrane normal.     Nose: Nose normal.  Eyes:     Extraocular Movements: Extraocular movements intact.     Pupils: Pupils are equal, round, and reactive to light.     Comments: Senile arcus   Cardiovascular:     Rate and Rhythm: Normal rate and regular rhythm.  Pulmonary:     Effort: Pulmonary effort is normal.     Breath sounds: Normal breath sounds.  Abdominal:     General: Abdomen is flat. Bowel sounds are normal.  Musculoskeletal:        General: Normal range of motion.     Cervical back: Normal range of motion.  Skin:    General: Skin is warm and dry.  Neurological:     Mental Status: He is alert and oriented to person, place, and time.  Psychiatric:        Mood and Affect: Mood normal.        Behavior: Behavior normal.        Thought Content: Thought content normal.        Judgment: Judgment normal.     BP (!) 146/88 (BP Location: Left Arm, Patient Position: Sitting, Cuff Size: Normal)   Pulse 98   Temp (!) 97.2 F (36.2 C) (Temporal)   Ht 5\' 11"  (1.803 m)   SpO2 97%   BMI 19.14 kg/m  Wt Readings from Last 3 Encounters:  12/24/19 137 lb 3.2 oz (62.2 kg)  11/12/19 136 lb 11 oz (62 kg)  08/06/19 135 lb 12.9 oz (61.6 kg)     Health Maintenance Due  Topic Date Due  . Hepatitis C Screening  Never done  . COVID-19 Vaccine (1) Never done  . COLONOSCOPY  Never done    There are no preventive care reminders to display for this patient.  Lab Results  Component Value Date   TSH 0.168 (L) 08/06/2019   Lab Results  Component Value Date   WBC 3.4 (L) 11/13/2019   HGB 14.7 11/13/2019   HCT 44.7 11/13/2019   MCV 94.9 11/13/2019   PLT 255 11/13/2019   Lab Results  Component Value Date   NA  139 11/13/2019   K 4.4 11/13/2019   CO2 27 11/13/2019  GLUCOSE 117 (H) 11/13/2019   BUN 7 (L) 11/13/2019   CREATININE 0.78 11/13/2019   BILITOT 0.9 11/13/2019   ALKPHOS 51 11/13/2019   AST 33 11/13/2019   ALT 20 11/13/2019   PROT 8.4 (H) 11/13/2019   ALBUMIN 4.7 11/13/2019   CALCIUM 9.8 11/13/2019   ANIONGAP 13 11/13/2019   No results found for: CHOL No results found for: HDL No results found for: LDLCALC No results found for: TRIG No results found for: CHOLHDL No results found for: HGBA1C    Assessment & Plan:  Forrest was seen today for blood pressure check.  Diagnoses and all orders for this visit:  Essential hypertension Blood pressure goal 140/90.  Regards to age.  Take medication as reviewed and prescribed.   Colon cancer screening Internal Referral, Routine, [facility] OFFICE, Gastroenterology, Specialty Services Required  Alcohol abuse Prior to circumstance he drink beers, he states only drinks about 3 a day. Not currently interested in stopping  Need for vaccination against Streptococcus pneumoniae using pneumococcal conjugate vaccine 13 CDC recommendations for pneumococcal vaccination to help prevent the spread of pneumococcal disease.    Follow-up: Return in about 2 months (around 03/15/2020) for BP recheck .    Kerin Perna, NP

## 2020-03-15 ENCOUNTER — Ambulatory Visit (INDEPENDENT_AMBULATORY_CARE_PROVIDER_SITE_OTHER): Payer: Medicare Other | Admitting: Primary Care

## 2020-03-15 ENCOUNTER — Other Ambulatory Visit: Payer: Self-pay

## 2020-03-15 ENCOUNTER — Encounter (INDEPENDENT_AMBULATORY_CARE_PROVIDER_SITE_OTHER): Payer: Self-pay | Admitting: Primary Care

## 2020-03-15 VITALS — BP 148/91 | HR 91 | Temp 98.1°F | Ht 71.0 in | Wt 143.8 lb

## 2020-03-15 DIAGNOSIS — I10 Essential (primary) hypertension: Secondary | ICD-10-CM | POA: Diagnosis not present

## 2020-03-15 DIAGNOSIS — N401 Enlarged prostate with lower urinary tract symptoms: Secondary | ICD-10-CM | POA: Diagnosis not present

## 2020-03-15 DIAGNOSIS — Z1159 Encounter for screening for other viral diseases: Secondary | ICD-10-CM

## 2020-03-15 DIAGNOSIS — Z13220 Encounter for screening for lipoid disorders: Secondary | ICD-10-CM

## 2020-03-15 DIAGNOSIS — F1010 Alcohol abuse, uncomplicated: Secondary | ICD-10-CM

## 2020-03-15 MED ORDER — HYDROCHLOROTHIAZIDE 25 MG PO TABS: 25.0000 mg | ORAL_TABLET | Freq: Every day | ORAL | 3 refills | Status: DC

## 2020-03-15 MED ORDER — AMLODIPINE BESYLATE 10 MG PO TABS: 10.0000 mg | ORAL_TABLET | Freq: Every day | ORAL | 3 refills | Status: DC

## 2020-03-15 MED ORDER — TAMSULOSIN HCL 0.4 MG PO CAPS: 0.4000 mg | ORAL_CAPSULE | Freq: Every day | ORAL | 1 refills | Status: DC

## 2020-03-15 NOTE — Progress Notes (Signed)
Established Patient Office Visit  Subjective:  Patient ID: Robert Molina, male    DOB: 07/29/1950  Age: 69 y.o. MRN: [id number]  CC:  Chief Complaint  Patient presents with  . Blood Pressure Check  3-4 beers a day  HPI Robert Molina is a 69 year old male who presents for the management of HTN, Denies shortness of breath, headaches, chest pain or lower extremity edema Does have nocturia. Alcohol abuse 3-4 beers a day the first one starts around 10-11. Explained that is a alcoholic by definition . Past Medical History:  Diagnosis Date  . Alcohol abuse   . Pneumonia    hx of about  34yr ago    Past Surgical History:  Procedure Laterality Date  . PAROTIDECTOMY Right 05/06/2013   Procedure: PAROTIDECTOMY;  Surgeon: MRuby Cola MD;  [facility] [facility]  Service: E[place];  Laterality: Right;  With Nerve Monitor  . RADICAL NECK DISSECTION Right 05/06/2013   Procedure: RADICAL NECK DISSECTION;  Surgeon: MRuby Cola MD;  [facility];  Service: E[facility];  Laterality: Right;    Family History  Problem Relation Age of Onset  . Diabetes type II Neg Hx     Social History   Socioeconomic History  . Marital status: Single    Spouse name: Not on file  . Number of children: Not on file  . Years of education: Not on file  . Highest education level: Not on file  Occupational History  . Not on file  Tobacco Use  . Smoking status: Current Every Day Smoker    Packs/day: 1.00    Years: 44.00    Pack years: 44.00    Types: Cigarettes  . Smokeless tobacco: Never Used  Substance and Sexual Activity  . Alcohol use: Yes    Comment: daily  1-2 beers  . Drug use: No  . Sexual activity: Never  Other Topics Concern  . Not on file  Social History Narrative  . Not on file   Social Determinants of Health   Financial Resource Strain: Not on file  Food Insecurity: Not on file  Transportation Needs: Not on file  Physical Activity: Not on file  Stress: Not on file  Social  Connections: Not on file  Intimate Partner Violence: Not on file    Outpatient Medications Prior to Visit  Medication Sig Dispense Refill  . thiamine 100 MG tablet Take 1 tablet (100 mg total) by mouth daily. 90 tablet 1  . amLODipine (NORVASC) 10 MG tablet Take 1 tablet (10 mg total) by mouth daily. 90 tablet 3  . hydrochlorothiazide (HYDRODIURIL) 25 MG tablet Take 1 tablet (25 mg total) by mouth daily. 90 tablet 3  . tamsulosin (FLOMAX) 0.4 MG CAPS capsule Take 1 capsule (0.4 mg total) by mouth daily. 90 capsule 1  . carvedilol (COREG) 3.125 MG tablet Take 1 tablet (3.125 mg total) by mouth 2 (two) times daily. 60 tablet 11   No facility-administered medications prior to visit.    No Known Allergies  ROS Review of Systems  All other systems reviewed and are negative.     Objective:    Physical Exam Vitals reviewed.  Constitutional:      Appearance: Normal appearance.  HENT:     Head: Normocephalic.     Right Ear: External ear normal.     Left Ear: External ear normal.     Nose: Nose normal.  Eyes:     Extraocular Movements: Extraocular movements intact.  Pupils: Pupils are equal, round, and reactive to light.  Cardiovascular:     Rate and Rhythm: Normal rate and regular rhythm.  Pulmonary:     Effort: Pulmonary effort is normal.     Breath sounds: Normal breath sounds.  Abdominal:     General: Abdomen is flat. Bowel sounds are normal.     Palpations: Abdomen is soft.  Musculoskeletal:        General: Normal range of motion.  Skin:    General: Skin is warm and dry.  Neurological:     Mental Status: He is alert and oriented to person, place, and time.  Psychiatric:        Mood and Affect: Mood normal.        Behavior: Behavior normal.        Thought Content: Thought content normal.        Judgment: Judgment normal.     BP (!) 148/91 (BP Location: Right Arm, Patient Position: Sitting, Cuff Size: Normal)   Pulse 91   Temp 98.1 F (36.7 C) (Temporal)    Ht 5' 11"  (1.803 m)   Wt 143 lb 12.8 oz (65.2 kg)   SpO2 95%   BMI 20.06 kg/m  Wt Readings from Last 3 Encounters:  03/15/20 143 lb 12.8 oz (65.2 kg)  12/24/19 137 lb 3.2 oz (62.2 kg)  11/12/19 136 lb 11 oz (62 kg)     Health Maintenance Due  Topic Date Due  . COVID-19 Vaccine (1) Never done    There are no preventive care reminders to display for this patient.  Lab Results  Component Value Date   TSH 0.168 (L) 08/06/2019   Lab Results  Component Value Date   WBC 5.2 03/15/2020   HGB 14.4 03/15/2020   HCT 42.2 03/15/2020   MCV 91 03/15/2020   PLT 329 03/15/2020   Lab Results  Component Value Date   NA 145 (H) 03/15/2020   K 3.8 03/15/2020   CO2 27 03/15/2020   GLUCOSE 83 03/15/2020   BUN 9 03/15/2020   CREATININE 0.92 03/15/2020   BILITOT 0.5 03/15/2020   ALKPHOS 77 03/15/2020   AST 20 03/15/2020   ALT 13 03/15/2020   PROT 7.7 03/15/2020   ALBUMIN 4.5 03/15/2020   CALCIUM 9.5 03/15/2020   ANIONGAP 13 11/13/2019   Lab Results  Component Value Date   CHOL 209 (H) 03/15/2020   Lab Results  Component Value Date   HDL 97 03/15/2020   Lab Results  Component Value Date   LDLCALC 103 (H) 03/15/2020   Lab Results  Component Value Date   TRIG 47 03/15/2020   Lab Results  Component Value Date   CHOLHDL 2.2 03/15/2020   No results found for: HGBA1C    Assessment & Plan:  Halen was seen today for blood pressure check.  Diagnoses and all orders for this visit:  Alcohol abuse -     CMP14+EGFR -     Vitamin B12  Essential hypertension Counseled on blood pressure goal of less than 130/80, low-sodium, DASH diet, medication compliance, 150 minutes of moderate intensity exercise per week. Discussed medication compliance, adverse effects. -     amLODipine (NORVASC) 10 MG tablet; Take 1 tablet (10 mg total) by mouth daily. -     hydrochlorothiazide (HYDRODIURIL) 25 MG tablet; Take 1 tablet (25 mg total) by mouth daily. -     CMP14+EGFR -     CBC with  Differential  Benign prostatic hyperplasia with lower urinary  tract symptoms, symptom details unspecified -     tamsulosin (FLOMAX) 0.4 MG CAPS capsule; Take 1 capsule (0.4 mg total) by mouth daily. -     PSA  Need for hepatitis C screening test -     Hepatitis C Antibody  Lipid screening -     Lipid Panel    Meds ordered this encounter  Medications  . amLODipine (NORVASC) 10 MG tablet    Sig: Take 1 tablet (10 mg total) by mouth daily.    Dispense:  90 tablet    Refill:  3  . hydrochlorothiazide (HYDRODIURIL) 25 MG tablet    Sig: Take 1 tablet (25 mg total) by mouth daily.    Dispense:  90 tablet    Refill:  3  . tamsulosin (FLOMAX) 0.4 MG CAPS capsule    Sig: Take 1 capsule (0.4 mg total) by mouth daily.    Dispense:  90 capsule    Refill:  1    Follow-up: Return today (on 03/15/2020), or if symptoms worsen or fail to improve, for IF patient decides he has a problem and needs treatment noncompliance drinking alcohol unable to tx .    Kerin Perna, NP

## 2020-03-17 LAB — CBC WITH DIFFERENTIAL/PLATELET
Basophils Absolute: 0 10*3/uL (ref 0.0–0.2)
Basos: 1 %
EOS (ABSOLUTE): 0.1 10*3/uL (ref 0.0–0.4)
Eos: 1 %
Hematocrit: 42.2 % (ref 37.5–51.0)
Hemoglobin: 14.4 g/dL (ref 13.0–17.7)
Immature Grans (Abs): 0 10*3/uL (ref 0.0–0.1)
Immature Granulocytes: 0 %
Lymphocytes Absolute: 1.1 10*3/uL (ref 0.7–3.1)
Lymphs: 22 %
MCH: 31.2 pg (ref 26.6–33.0)
MCHC: 34.1 g/dL (ref 31.5–35.7)
MCV: 91 fL (ref 79–97)
Monocytes Absolute: 0.6 10*3/uL (ref 0.1–0.9)
Monocytes: 11 %
Neutrophils Absolute: 3.4 10*3/uL (ref 1.4–7.0)
Neutrophils: 65 %
Platelets: 329 10*3/uL (ref 150–450)
RBC: 4.62 x10E6/uL (ref 4.14–5.80)
RDW: 13.4 % (ref 11.6–15.4)
WBC: 5.2 10*3/uL (ref 3.4–10.8)

## 2020-03-17 LAB — CMP14+EGFR
ALT: 13 IU/L (ref 0–44)
AST: 20 IU/L (ref 0–40)
Albumin/Globulin Ratio: 1.4 (ref 1.2–2.2)
Albumin: 4.5 g/dL (ref 3.8–4.8)
Alkaline Phosphatase: 77 IU/L (ref 44–121)
BUN/Creatinine Ratio: 10 (ref 10–24)
BUN: 9 mg/dL (ref 8–27)
Bilirubin Total: 0.5 mg/dL (ref 0.0–1.2)
CO2: 27 mmol/L (ref 20–29)
Calcium: 9.5 mg/dL (ref 8.6–10.2)
Chloride: 101 mmol/L (ref 96–106)
Creatinine, Ser: 0.92 mg/dL (ref 0.76–1.27)
GFR calc Af Amer: 99 mL/min/{1.73_m2} (ref >59)
GFR calc non Af Amer: 86 mL/min/{1.73_m2} (ref >59)
Globulin, Total: 3.2 g/dL (ref 1.5–4.5)
Glucose: 83 mg/dL (ref 65–99)
Potassium: 3.8 mmol/L (ref 3.5–5.2)
Sodium: 145 mmol/L — ABNORMAL HIGH (ref 134–144)
Total Protein: 7.7 g/dL (ref 6.0–8.5)

## 2020-03-17 LAB — LIPID PANEL
Chol/HDL Ratio: 2.2 ratio (ref 0.0–5.0)
Cholesterol, Total: 209 mg/dL — ABNORMAL HIGH (ref 100–199)
HDL: 97 mg/dL (ref >39)
LDL Chol Calc (NIH): 103 mg/dL — ABNORMAL HIGH (ref 0–99)
Triglycerides: 47 mg/dL (ref 0–149)
VLDL Cholesterol Cal: 9 mg/dL (ref 5–40)

## 2020-03-17 LAB — HEPATITIS C ANTIBODY: Hep C Virus Ab: <0.1 s/co ratio (ref 0.0–0.9)

## 2020-03-17 LAB — VITAMIN B12: Vitamin B-12: 355 pg/mL (ref 232–1245)

## 2020-03-17 LAB — PSA: Prostate Specific Ag, Serum: 135 ng/mL — ABNORMAL HIGH (ref 0.0–4.0)

## 2020-03-21 ENCOUNTER — Other Ambulatory Visit (INDEPENDENT_AMBULATORY_CARE_PROVIDER_SITE_OTHER): Payer: Self-pay | Admitting: Primary Care

## 2020-03-21 ENCOUNTER — Telehealth (INDEPENDENT_AMBULATORY_CARE_PROVIDER_SITE_OTHER): Payer: Self-pay

## 2020-03-21 MED ORDER — PRAVASTATIN SODIUM 20 MG PO TABS
20.0000 mg | ORAL_TABLET | Freq: Every day | ORAL | 3 refills | Status: DC
Start: 2020-03-21 — End: 2020-06-13

## 2020-03-21 NOTE — Telephone Encounter (Signed)
-----   Message from Kerin Perna, NP sent at 03/21/2020  3:55 PM EST ----- PSA  is elevated will send in medication Tamsulosin 0.31m at bedtime and refer to urology. Cholesterol that can lead to heart attack and stroke. To lower your number you can decrease your fatty foods, red meat, cheese, milk and increase fiber like whole grains and veggies. Pravastatin 10mg  at bedtime

## 2020-03-21 NOTE — Telephone Encounter (Signed)
Contacted patient. He verified date of birth. He is aware of results per PCP and he verbalized understanding. Please send pravastatin to patients pharmacy as it has not been sent. Nat Christen, CMA

## 2020-06-13 ENCOUNTER — Encounter (INDEPENDENT_AMBULATORY_CARE_PROVIDER_SITE_OTHER): Payer: Self-pay | Admitting: Primary Care

## 2020-06-13 ENCOUNTER — Ambulatory Visit (INDEPENDENT_AMBULATORY_CARE_PROVIDER_SITE_OTHER): Payer: Medicare Other | Admitting: Primary Care

## 2020-06-13 ENCOUNTER — Other Ambulatory Visit: Payer: Self-pay

## 2020-06-13 VITALS — BP 150/78 | HR 103 | Temp 97.3°F | Ht 71.0 in | Wt 153.6 lb

## 2020-06-13 DIAGNOSIS — I10 Essential (primary) hypertension: Secondary | ICD-10-CM

## 2020-06-13 DIAGNOSIS — Z760 Encounter for issue of repeat prescription: Secondary | ICD-10-CM | POA: Diagnosis not present

## 2020-06-13 DIAGNOSIS — R972 Elevated prostate specific antigen [PSA]: Secondary | ICD-10-CM | POA: Diagnosis not present

## 2020-06-13 DIAGNOSIS — N401 Enlarged prostate with lower urinary tract symptoms: Secondary | ICD-10-CM

## 2020-06-13 DIAGNOSIS — B351 Tinea unguium: Secondary | ICD-10-CM

## 2020-06-13 MED ORDER — TAMSULOSIN HCL 0.4 MG PO CAPS: 0.4000 mg | ORAL_CAPSULE | Freq: Every day | ORAL | 1 refills | Status: DC

## 2020-06-13 MED ORDER — AMLODIPINE BESYLATE 10 MG PO TABS: 10.0000 mg | ORAL_TABLET | Freq: Every day | ORAL | 3 refills | Status: DC

## 2020-06-13 MED ORDER — PRAVASTATIN SODIUM 20 MG PO TABS: 20.0000 mg | ORAL_TABLET | Freq: Every day | ORAL | 3 refills | Status: DC

## 2020-06-13 MED ORDER — CARVEDILOL 3.125 MG PO TABS: 3.1250 mg | ORAL_TABLET | Freq: Two times a day (BID) | ORAL | 1 refills | Status: DC

## 2020-06-13 NOTE — Patient Instructions (Signed)

## 2020-06-13 NOTE — Progress Notes (Signed)
Established Patient Office Visit  Subjective:  Patient ID: Robert Molina, male    DOB: 1950/05/05  Age: 70 y.o. MRN: [id number]  CC:  Chief Complaint  Patient presents with  . Blood Pressure Check    HPI Robert Molina is a 70 year old male who presents for hypertension management. Denies shortness of breath, headaches, chest pain or lower extremity edema, sudden onset, vision changes, unilateral weakness, dizziness, paresthesias  Past Medical History:  Diagnosis Date  . Alcohol abuse   . Pneumonia    hx of about  63yrs ago    Past Surgical History:  Procedure Laterality Date  . PAROTIDECTOMY Right 05/06/2013   Procedure: PAROTIDECTOMY;  Surgeon: Ruby Cola, MD;  Location: [place];  Service: ENT;  Laterality: Right;  With Nerve Monitor  . RADICAL NECK DISSECTION Right 05/06/2013   Procedure: RADICAL NECK DISSECTION;  Surgeon: Ruby Cola, MD;  Location: [facility];  Service: ENT;  Laterality: Right;    Family History  Problem Relation Age of Onset  . Diabetes type II Neg Hx     Social History   Socioeconomic History  . Marital status: Single    Spouse name: Not on file  . Number of children: Not on file  . Years of education: Not on file  . Highest education level: Not on file  Occupational History  . Not on file  Tobacco Use  . Smoking status: Current Every Day Smoker    Packs/day: 1.00    Years: 44.00    Pack years: 44.00    Types: Cigarettes  . Smokeless tobacco: Never Used  Substance and Sexual Activity  . Alcohol use: Yes    Comment: daily  1-2 beers  . Drug use: No  . Sexual activity: Never  Other Topics Concern  . Not on file  Social History Narrative  . Not on file   Social Determinants of Health   Financial Resource Strain: Not on file  Food Insecurity: Not on file  Transportation Needs: Not on file  Physical Activity: Not on file  Stress: Not on file  Social Connections: Not on file  Intimate Partner Violence: Not on file     Outpatient Medications Prior to Visit  Medication Sig Dispense Refill  . hydrochlorothiazide (HYDRODIURIL) 25 MG tablet Take 1 tablet (25 mg total) by mouth daily. 90 tablet 3  . amLODipine (NORVASC) 10 MG tablet Take 1 tablet (10 mg total) by mouth daily. 90 tablet 3  . pravastatin (PRAVACHOL) 20 MG tablet Take 1 tablet (20 mg total) by mouth daily. 90 tablet 3  . tamsulosin (FLOMAX) 0.4 MG CAPS capsule Take 1 capsule (0.4 mg total) by mouth daily. 90 capsule 1  . thiamine 100 MG tablet Take 1 tablet (100 mg total) by mouth daily. (Patient not taking: Reported on 06/13/2020) 90 tablet 1  . carvedilol (COREG) 3.125 MG tablet Take 1 tablet (3.125 mg total) by mouth 2 (two) times daily. (Patient not taking: Reported on 06/13/2020) 60 tablet 11   No facility-administered medications prior to visit.    No Known Allergies  ROS Review of Systems Pertinent positive and negative noted HPI    Objective:    Physical Exam Vitals reviewed.  Constitutional:      Appearance: Normal appearance.     Comments: Thin/frail   HENT:     Head: Normocephalic.     Right Ear: Tympanic membrane and external ear normal.     Left Ear: Tympanic membrane and external  ear normal.     Nose: Nose normal.  Cardiovascular:     Rate and Rhythm: Normal rate and regular rhythm.  Pulmonary:     Effort: Pulmonary effort is normal.     Breath sounds: Rhonchi present.  Abdominal:     General: Abdomen is flat. Bowel sounds are normal.     Palpations: Abdomen is soft.  Musculoskeletal:        General: Normal range of motion.     Cervical back: Normal range of motion.  Skin:    General: Skin is warm and dry.  Neurological:     Mental Status: He is alert and oriented to person, place, and time.  Psychiatric:        Mood and Affect: Mood normal.        Behavior: Behavior normal.        Thought Content: Thought content normal.        Judgment: Judgment normal.     BP (!) 150/78 (BP Location: Right Arm,  Patient Position: Sitting, Cuff Size: Normal)   Pulse (!) 103   Temp (!) 97.3 F (36.3 C) (Temporal)   Ht 5\' 11"  (1.803 m)   Wt 153 lb 9.6 oz (69.7 kg)   SpO2 95%   BMI 21.42 kg/m  Wt Readings from Last 3 Encounters:  06/13/20 153 lb 9.6 oz (69.7 kg)  03/15/20 143 lb 12.8 oz (65.2 kg)  12/24/19 137 lb 3.2 oz (62.2 kg)     Health Maintenance Due  Topic Date Due  . COVID-19 Vaccine (1) Never done    There are no preventive care reminders to display for this patient.  Lab Results  Component Value Date   TSH 0.168 (L) 08/06/2019   Lab Results  Component Value Date   WBC 5.2 03/15/2020   HGB 14.4 03/15/2020   HCT 42.2 03/15/2020   MCV 91 03/15/2020   PLT 329 03/15/2020   Lab Results  Component Value Date   NA 145 (H) 03/15/2020   K 3.8 03/15/2020   CO2 27 03/15/2020   GLUCOSE 83 03/15/2020   BUN 9 03/15/2020   CREATININE 0.92 03/15/2020   BILITOT 0.5 03/15/2020   ALKPHOS 77 03/15/2020   AST 20 03/15/2020   ALT 13 03/15/2020   PROT 7.7 03/15/2020   ALBUMIN 4.5 03/15/2020   CALCIUM 9.5 03/15/2020   ANIONGAP 13 11/13/2019   Lab Results  Component Value Date   CHOL 209 (H) 03/15/2020   Lab Results  Component Value Date   HDL 97 03/15/2020   Lab Results  Component Value Date   LDLCALC 103 (H) 03/15/2020   Lab Results  Component Value Date   TRIG 47 03/15/2020   Lab Results  Component Value Date   CHOLHDL 2.2 03/15/2020   No results found for: HGBA1C    Assessment & Plan:   [place] was seen today for blood pressure check.  Diagnoses and all orders for this visit:  Medication refill -     tamsulosin (FLOMAX) 0.4 MG CAPS capsule; Take 1 capsule (0.4 mg total) by mouth daily. -     amLODipine (NORVASC) 10 MG tablet; Take 1 tablet (10 mg total) by mouth daily. -     pravastatin (PRAVACHOL) 20 MG tablet; Take 1 tablet (20 mg total) by mouth daily.  Benign prostatic hyperplasia with lower urinary tract symptoms, symptom details unspecified -      tamsulosin (FLOMAX) 0.4 MG CAPS capsule; Take 1 capsule (0.4 mg total) by mouth daily.  Ambulatory referral to Urology  Essential hypertension Counseled on blood pressure goal of less than 140/90, low-sodium, DASH diet, medication compliance, 150 minutes of moderate intensity exercise per week. Discussed medication compliance, adverse effects. -     amLODipine (NORVASC) 10 MG tablet; Take 1 tablet (10 mg total) by mouth daily. added carvedilol (COREG) 3.125 MG tablet; Take 1 tablet (3.125 mg total) by mouth 2 (two) times daily.  -   Onychomycosis    -     Ambulatory referral to Podiatry  Other orders -     carvedilol (COREG) 3.125 MG tablet; Take 1 tablet (3.125 mg total) by mouth 2 (two) times daily.   Follow-up: Return in about 7 weeks (around 08/01/2020) for BP check in person .    Kerin Perna, NP

## 2020-06-30 ENCOUNTER — Encounter: Payer: Self-pay | Admitting: Podiatry

## 2020-06-30 ENCOUNTER — Other Ambulatory Visit: Payer: Self-pay

## 2020-06-30 ENCOUNTER — Ambulatory Visit (INDEPENDENT_AMBULATORY_CARE_PROVIDER_SITE_OTHER): Payer: Medicare Other | Admitting: Podiatry

## 2020-06-30 DIAGNOSIS — M79675 Pain in left toe(s): Secondary | ICD-10-CM

## 2020-06-30 DIAGNOSIS — M79674 Pain in right toe(s): Secondary | ICD-10-CM | POA: Diagnosis not present

## 2020-06-30 DIAGNOSIS — B351 Tinea unguium: Secondary | ICD-10-CM | POA: Diagnosis not present

## 2020-07-03 NOTE — Progress Notes (Signed)
Subjective:   Patient ID: Robert Molina, male   DOB: 70 y.o.   MRN: [id number]   HPI Patient presents with significant elongated nailbeds 1-5 both feet that are becoming increasingly hard for him to cut and shoe gear is painful and presents on referral.  Patient does smoke a pack per day and is not significantly active   Review of Systems  All other systems reviewed and are negative.       Objective:  Physical Exam Vitals and nursing note reviewed.  Constitutional:      Appearance: He is well-developed.  Pulmonary:     Effort: Pulmonary effort is normal.  Musculoskeletal:        General: Normal range of motion.  Skin:    General: Skin is warm.  Neurological:     Mental Status: He is alert.     Neurovascular status found to be mildly diminished but intact with patient found to have thick yellow brittle nailbeds 1-5 both feet that are elongated get sore in shoe gear and patient did have good digital perfusion but concerned about this long-term smoke      Assessment:  Mild reduction in vascular flow but intact with no claudication symptoms with mycotic nail infection 1-5 both feet with pain     Plan:  H&P counseled on cessation of smoking and today debrided nailbeds 1-5 both feet no iatrogenic bleeding reappoint routine care

## 2020-08-02 ENCOUNTER — Encounter (INDEPENDENT_AMBULATORY_CARE_PROVIDER_SITE_OTHER): Payer: Self-pay | Admitting: Primary Care

## 2020-08-02 ENCOUNTER — Other Ambulatory Visit: Payer: Self-pay

## 2020-08-02 ENCOUNTER — Ambulatory Visit (INDEPENDENT_AMBULATORY_CARE_PROVIDER_SITE_OTHER): Payer: Medicare Other | Admitting: Primary Care

## 2020-08-02 VITALS — BP 136/82 | HR 85 | Temp 97.5°F | Ht 71.0 in | Wt 146.4 lb

## 2020-08-02 DIAGNOSIS — Z760 Encounter for issue of repeat prescription: Secondary | ICD-10-CM

## 2020-08-02 DIAGNOSIS — R600 Localized edema: Secondary | ICD-10-CM | POA: Diagnosis not present

## 2020-08-02 DIAGNOSIS — F1010 Alcohol abuse, uncomplicated: Secondary | ICD-10-CM

## 2020-08-02 DIAGNOSIS — I10 Essential (primary) hypertension: Secondary | ICD-10-CM

## 2020-08-02 DIAGNOSIS — F17200 Nicotine dependence, unspecified, uncomplicated: Secondary | ICD-10-CM | POA: Diagnosis not present

## 2020-08-02 MED ORDER — CARVEDILOL 3.125 MG PO TABS: 3.1250 mg | ORAL_TABLET | Freq: Two times a day (BID) | ORAL | 1 refills | Status: DC

## 2020-08-02 MED ORDER — HYDROCHLOROTHIAZIDE 25 MG PO TABS: 25.0000 mg | ORAL_TABLET | Freq: Every day | ORAL | 3 refills | Status: DC

## 2020-08-02 NOTE — Patient Instructions (Signed)
Purchase Ted/compression stockings Edema  Edema is when you have too much fluid in your body or under your skin. Edema may make your legs, feet, and ankles swell up. Swelling is also common in looser tissues, like around your eyes. This is a common condition. It gets more common as you get older. There are many possible causes of edema. Eating too much salt (sodium) and being on your feet or sitting for a long time can cause edema in your legs, feet, and ankles. Hot weather may make edema worse. Edema is usually painless. Your skin may look swollen or shiny. Follow these instructions at home:  Keep the swollen body part raised (elevated) above the level of your heart when you are sitting or lying down.  Do not sit still or stand for a long time.  Do not wear tight clothes. Do not wear garters on your upper legs.  Exercise your legs. This can help the swelling go down.  Wear elastic bandages or support stockings as told by your doctor.  Eat a low-salt (low-sodium) diet to reduce fluid as told by your doctor.  Depending on the cause of your swelling, you may need to limit how much fluid you drink (fluid restriction).  Take over-the-counter and prescription medicines only as told by your doctor. Contact a doctor if:  Treatment is not working.  You have heart, liver, or kidney disease and have symptoms of edema.  You have sudden and unexplained weight gain. Get help right away if:  You have shortness of breath or chest pain.  You cannot breathe when you lie down.  You have pain, redness, or warmth in the swollen areas.  You have heart, liver, or kidney disease and get edema all of a sudden.  You have a fever and your symptoms get worse all of a sudden. Summary  Edema is when you have too much fluid in your body or under your skin.  Edema may make your legs, feet, and ankles swell up. Swelling is also common in looser tissues, like around your eyes.  Raise (elevate) the  swollen body part above the level of your heart when you are sitting or lying down.  Follow your doctor's instructions about diet and how much fluid you can drink (fluid restriction). This information is not intended to replace advice given to you by your health care provider. Make sure you discuss any questions you have with your health care provider. Document Revised: 01/20/2020 Document Reviewed: 01/20/2020 Elsevier Patient Education  2021 [employer].

## 2020-08-02 NOTE — Progress Notes (Signed)
Robert Molina for  hypertension evaluation, on previous visit medication was adjusted to include amlodipine 10mg  daily , coreg 3.125 twice daily and HCTZ 25mg   Robert Molina denies shortness of breath, headaches, chest pain or lower extremity edema, sudden onset, vision changes, unilateral weakness, dizziness, paresthesias Robert Molina continues to drink 2 beers daily and smoke cigaretts 1/2-1 ppk daily. Robert Molina is not interested in stopping drinking beer or smoking been doing this this Robert Molina was 70 years old and now 32.  Patient reports adherence with medications.  Current Medication List Current Outpatient Medications on File Prior to Visit  Medication Sig Dispense Refill  . amLODipine (NORVASC) 10 MG tablet Take 1 tablet (10 mg total) by mouth daily. 90 tablet 3  . carvedilol (COREG) 3.125 MG tablet Take 1 tablet (3.125 mg total) by mouth 2 (two) times daily. 120 tablet 1  . hydrochlorothiazide (HYDRODIURIL) 25 MG tablet Take 1 tablet (25 mg total) by mouth daily. 90 tablet 3  . pravastatin (PRAVACHOL) 20 MG tablet Take 1 tablet (20 mg total) by mouth daily. 90 tablet 3  . tamsulosin (FLOMAX) 0.4 MG CAPS capsule Take 1 capsule (0.4 mg total) by mouth daily. 90 capsule 1  . thiamine 100 MG tablet Take 1 tablet (100 mg total) by mouth daily. 90 tablet 1   No current facility-administered medications on file prior to visit.   Past Medical History  Past Medical History:  Diagnosis Date  . Alcohol abuse   . Pneumonia    hx of about  15yrs ago   Dietary habits include: healthy diet no pork  Exercise habits include:walking  Family / Social history: None   ASCVD risk factors include- Robert Molina  O:  Physical Exam Vitals reviewed.  Constitutional:      Appearance: Normal appearance.  HENT:     Head: Normocephalic.     Right Ear: External ear normal.     Left Ear: External ear normal.     Nose: Nose normal.  Eyes:     Extraocular Movements: Extraocular movements intact.  Cardiovascular:      Rate and Rhythm: Normal rate and regular rhythm.  Pulmonary:     Effort: Pulmonary effort is normal.     Breath sounds: Normal breath sounds.  Abdominal:     General: Bowel sounds are normal.     Palpations: Abdomen is soft.  Musculoskeletal:        General: Normal range of motion.     Cervical back: Normal range of motion.     Comments: Edema ankles and feet  Neurological:     Mental Status: Robert Molina is alert and oriented to person, place, and time.  Psychiatric:        Mood and Affect: Mood normal.        Behavior: Behavior normal.        Thought Content: Thought content normal.        Judgment: Judgment normal.      Review of Systems  Cardiovascular: Positive for leg swelling.  All other systems reviewed and are negative.   Last 3 Office BP readings: BP Readings from Last 3 Encounters:  08/02/20 136/82  06/13/20 (!) 150/78  03/15/20 (!) 148/91    BMET    Component Value Date/Time   NA 145 (H) 03/15/2020 1541   K 3.8 03/15/2020 1541   CL 101 03/15/2020 1541   CO2 27 03/15/2020 1541   GLUCOSE 83 03/15/2020 1541   GLUCOSE 117 (H) 11/13/2019 9924  BUN 9 03/15/2020 1541   CREATININE 0.92 03/15/2020 1541   CALCIUM 9.5 03/15/2020 1541   GFRNONAA 86 03/15/2020 1541   GFRAA 99 03/15/2020 1541    Renal function: CrCl cannot be calculated (Patient's most recent lab result is older than the maximum 21 days allowed.).  Clinical ASCVD: Yes  The 10-year ASCVD risk score Mikey Bussing DC Brooke Bonito., et al., 2013) is: 26.2%   Values used to calculate the score:     Age: 70 years     Sex: Male     Is Non-Hispanic African American: Yes     Diabetic: No     Tobacco smoker: Yes     Systolic Blood Pressure: 878 mmHg     Is BP treated: Yes     HDL Cholesterol: 97 mg/dL     Total Cholesterol: 209 mg/dL  Robert Molina was seen today for blood pressure check.  Diagnoses and all orders for this visit:  Alcohol abuse No interest in stopping (wife died nothing else to do) 2 beers a day is  not hard alcohol. Doing all his life sine 70 years old   Tobacco dependence No interest in stopping (wife died nothing else to do). Robert Molina is aware of risk factors with continuously smoking each visit will discuss cessation   Essential hypertension Hypertension longstanding diagnosed currently amlodipine 10mg  daily , coreg 3.125 twice daily and HCTZ 25mg  current medications. BP Goal = 140/90 < mmHg. Patient is adherent with current medications.  -Continued  -F/u labs ordered - f/u fasting  -Counseled on lifestyle modifications for blood pressure control including reduced dietary sodium, increased exercise, adequate sleep  Localized edema Bilateral ankles and feet on HCTZ encourage to elevate feet and purchase ted hose  Medication refill carvedilol (COREG) 3.125 MG tablet twice daily  hydrochlorothiazide (HYDRODIURIL) 25 MG tablet   Robert Molina

## 2020-11-02 ENCOUNTER — Encounter (INDEPENDENT_AMBULATORY_CARE_PROVIDER_SITE_OTHER): Payer: Self-pay | Admitting: Primary Care

## 2020-11-02 ENCOUNTER — Ambulatory Visit (INDEPENDENT_AMBULATORY_CARE_PROVIDER_SITE_OTHER): Payer: Medicare Other | Admitting: Primary Care

## 2020-11-02 ENCOUNTER — Other Ambulatory Visit: Payer: Self-pay

## 2020-11-02 VITALS — BP 124/77 | HR 70 | Temp 97.5°F | Ht 71.0 in | Wt 143.2 lb

## 2020-11-02 DIAGNOSIS — F1010 Alcohol abuse, uncomplicated: Secondary | ICD-10-CM

## 2020-11-02 DIAGNOSIS — I10 Essential (primary) hypertension: Secondary | ICD-10-CM | POA: Diagnosis not present

## 2020-11-02 DIAGNOSIS — F17200 Nicotine dependence, unspecified, uncomplicated: Secondary | ICD-10-CM

## 2020-11-02 DIAGNOSIS — N401 Enlarged prostate with lower urinary tract symptoms: Secondary | ICD-10-CM

## 2020-11-02 DIAGNOSIS — E782 Mixed hyperlipidemia: Secondary | ICD-10-CM | POA: Diagnosis not present

## 2020-11-02 DIAGNOSIS — F17210 Nicotine dependence, cigarettes, uncomplicated: Secondary | ICD-10-CM | POA: Diagnosis not present

## 2020-11-02 DIAGNOSIS — Z760 Encounter for issue of repeat prescription: Secondary | ICD-10-CM

## 2020-11-02 MED ORDER — TAMSULOSIN HCL 0.4 MG PO CAPS: 0.4000 mg | ORAL_CAPSULE | Freq: Every day | ORAL | 1 refills | Status: DC

## 2020-11-02 MED ORDER — CARVEDILOL 3.125 MG PO TABS: 3.1250 mg | ORAL_TABLET | Freq: Two times a day (BID) | ORAL | 1 refills | Status: DC

## 2020-11-02 NOTE — Patient Instructions (Signed)
Alcohol Abuse and Dependence Information, Adult Alcohol is a widely available drug. People drink alcohol in different amounts. People who drink alcohol very often and in large amounts often have problems during and after drinking. They may develop what is called an alcohol use disorder. There are two main types of alcohol use disorders: Alcohol abuse. This is when you use alcohol too much or too often. You may use alcohol to make yourself feel happy or to reduce stress. You may have a hard time setting a limit on the amount you drink. Alcohol dependence. This is when you use alcohol consistently for a period of time, and your body changes as a result. This can make it hard to stop drinking because you may start to feel sick or feel different when you do not use alcohol. These symptoms are known as withdrawal. How can alcohol abuse and dependence affect me? Alcohol abuse and dependence can have a negative effect on your life. Drinking too much can lead to addiction. You may feel like you need alcohol to function normally. You may drink alcohol before work in the morning, during the day, or as soon as you get home from work in the evening. These actions can result in: Poor work performance. Job loss. Financial problems. Car crashes or criminal charges from driving after drinking alcohol. Problems in your relationships with friends and family. Losing the trust and respect of coworkers, friends, and family. Drinking heavily over a long period of time can permanently damage your body and brain, and can cause lifelong health issues, such as: Damage to your liver or pancreas. Heart problems, high blood pressure, or stroke. Certain cancers. Decreased ability to fight infections. Brain or nerve damage. Depression. Early (premature) death. If you are careless or you crave alcohol, it is easy to drink more than your body can handle (overdose). Alcohol overdose is a serious situation that requires  hospitalization. It may lead to permanent injuries or death. What can increase my risk? Having a family history of alcohol abuse. Having depression or other mental health conditions. Beginning to drink at an early age. Binge drinking often. Experiencing trauma, stress, and an unstable home life during childhood. Spending time with people who drink often. What actions can I take to prevent or manage alcohol abuse and dependence? Do not drink alcohol if: Your health care provider tells you not to drink. You are pregnant, may be pregnant, or are planning to become pregnant. If you drink alcohol: Limit how much you use to: 0-1 drink a day for women. 0-2 drinks a day for men. Be aware of how much alcohol is in your drink. In the [place] one drink equals one 12 oz bottle of beer (355 mL), one 5 oz glass of wine (148 mL), or one 1 oz glass of hard liquor (44 mL). Stop drinking if you have been drinking too much. This can be very hard to do if you are used to abusing alcohol. If you begin to have withdrawal symptoms, talk with your health care provider or a person that you trust. These symptoms may include anxiety, shaky hands, headache, nausea, sweating, or not being able to sleep. Choose to drink nonalcoholic beverages in social gatherings and places where there may be alcohol. Activity Spend more time on activities that you enjoy that do not involve alcohol, like hobbies or exercise. Find healthy ways to cope with stress, such as exercise, meditation, or spending time with people you care about. General information Talk to your family,   coworkers, and friends about supporting you in your efforts to stop drinking. If they drink, ask them not to drink around you. Spend more time with people who do not drink alcohol. If you think that you have an alcohol dependency problem: Tell friends or family about your concerns. Talk with your health care provider or another health professional about where to  get help. Work with a therapist and a chemical dependency counselor. Consider joining a support group for people who struggle with alcohol abuse and dependence. Where to find support  Your health care provider. SMART Recovery: www.smartrecovery.org Therapy and support groups Local treatment centers or chemical dependency counselors. Local AA groups in your community: www.aa.org Where to find more information Centers for Disease Control and Prevention: www.cdc.gov National Institute on Alcohol Abuse and Alcoholism: www.niaaa.nih.gov Alcoholics Anonymous (AA): www.aa.org Contact a health care provider if: You drank more or for longer than you intended on more than one occasion. You tried to stop drinking or to cut back on how much you drink, but you were not able to. You often drink to the point of vomiting or passing out. You want to drink so badly that you cannot think about anything else. You have problems in your life due to drinking, but you continue to drink. You keep drinking even though you feel anxious, depressed, or have experienced memory loss. You have stopped doing the things you used to enjoy in order to drink. You have to drink more than you used to in order to get the effect you want. You experience anxiety, sweating, nausea, shakiness, and trouble sleeping when you try to stop drinking. Get help right away if: You have thoughts about hurting yourself or others. You have serious withdrawal symptoms, including: Confusion. Racing heart. High blood pressure. Fever. If you ever feel like you may hurt yourself or others, or have thoughts about taking your own life, get help right away. You can go to your nearest emergency department or call: Your local emergency services (911 in the [place] A suicide crisis helpline, such as the National Suicide Prevention Lifeline at 1-800-273-8255. This is open 24 hours a day. Summary Alcohol abuse and dependence can have a negative  effect on your life. Drinking too much or too often can lead to addiction. If you drink alcohol, limit how much you use. If you are having trouble keeping your drinking under control, find ways to change your behavior. Hobbies, calming activities, exercise, or support groups can help. If you feel you need help with changing your drinking habits, talk with your health care provider, a good friend, or a therapist, or go to an AA group. This information is not intended to replace advice given to you by your health care provider. Make sure you discuss any questions you have with your health care provider. Document Revised: 07/15/2018 Document Reviewed: 06/03/2018 Elsevier Patient Education  2022 Elsevier Inc.  

## 2020-11-02 NOTE — Progress Notes (Signed)
Robert Molina for  hypertension evaluation, on previous visit medication was adjusted to include amlodipine '10mg'$  daily , coreg 3.125 twice daily and HCTZ '25mg'$   He denies shortness of breath, headaches, chest pain or lower extremity edema, sudden onset, vision changes, unilateral weakness, dizziness, paresthesias. Smoke cigaretts 1/2-1 ppk daily. He is not interested in stopping drinking beer -feels like down to 4 a day is improvement No interest in stop smoking . Requesting medication refills. Patient reports adherence with medications.  Current Medication List Current Outpatient Medications on File Prior to Visit  Medication Sig Dispense Refill  . amLODipine (NORVASC) 10 MG tablet Take 1 tablet (10 mg total) by mouth daily. 90 tablet 3  . carvedilol (COREG) 3.125 MG tablet Take 1 tablet (3.125 mg total) by mouth 2 (two) times daily. 120 tablet 1  . hydrochlorothiazide (HYDRODIURIL) 25 MG tablet Take 1 tablet (25 mg total) by mouth daily. 90 tablet 3  . pravastatin (PRAVACHOL) 20 MG tablet Take 1 tablet (20 mg total) by mouth daily. 90 tablet 3  . tamsulosin (FLOMAX) 0.4 MG CAPS capsule Take 1 capsule (0.4 mg total) by mouth daily. 90 capsule 1  . thiamine 100 MG tablet Take 1 tablet (100 mg total) by mouth daily. 90 tablet 1   No current facility-administered medications on file prior to visit.   Past Medical History  Past Medical History:  Diagnosis Date  . Alcohol abuse   . Pneumonia    hx of about  52yr ago   Dietary habits include: healthy diet no pork  Exercise habits include:walking  Family / Social history: None   ASCVD risk factors include- Robert Molina:  Physical Exam Vitals reviewed.  Constitutional:      Appearance: Normal appearance.     Comments: Thin frame  HENT:     Head: Normocephalic.     Right Ear: Ear canal normal.     Left Ear: External ear normal.     Nose: Nose normal.  Eyes:     Extraocular Movements: Extraocular movements  intact.  Cardiovascular:     Rate and Rhythm: Normal rate and regular rhythm.  Pulmonary:     Effort: Pulmonary effort is normal.     Breath sounds: Normal breath sounds.  Abdominal:     General: Abdomen is flat. Bowel sounds are normal.     Palpations: Abdomen is soft.  Musculoskeletal:        General: Normal range of motion.     Cervical back: Normal range of motion.  Skin:    General: Skin is warm and dry.  Neurological:     Mental Status: He is alert and oriented to person, place, and time.  Psychiatric:        Mood and Affect: Mood normal.        Behavior: Behavior normal.     Review of Systems  All other systems reviewed and are negative.   Last 3 Office BP readings: BP Readings from Last 3 Encounters:  11/02/20 124/77  08/02/20 136/82  06/13/20 (!) 150/78    BMET    Component Value Date/Time   NA 145 (H) 03/15/2020 1541   K 3.8 03/15/2020 1541   CL 101 03/15/2020 1541   CO2 27 03/15/2020 1541   GLUCOSE 83 03/15/2020 1541   GLUCOSE 117 (H) 11/13/2019 0853   BUN 9 03/15/2020 1541   CREATININE 0.92 03/15/2020 1541   CALCIUM 9.5 03/15/2020 1541   GFRNONAA 86 03/15/2020 1541  GFRAA 99 03/15/2020 1541    Renal function: CrCl cannot be calculated (Patient's most recent lab result is older than the maximum 21 days allowed.).  Clinical ASCVD: Yes  The 10-year ASCVD risk score Mikey Bussing DC Brooke Bonito., et al., 2013) is: 23.2%   Values used to calculate the score:     Age: 70 years     Sex: Male     Is Non-Hispanic African American: Yes     Diabetic: No     Tobacco smoker: Yes     Systolic Blood Pressure: [id number] mmHg     Is BP treated: Yes     HDL Cholesterol: 97 mg/dL     Total Cholesterol: 209 mg/dL  Robert Molina was seen today for blood pressure check.  Diagnoses and all orders for this visit:  Alcohol abuse No interest in stopping (wife died nothing else to do) 4 beers a day is not hard alcohol. Feels this is not a problem. Starts drinking at 10 am  Tobacco  dependence - I have recommended complete cessation of tobacco use. I have discussed various options available for assistance with tobacco cessation including over the counter methods (Nicotine gum, patch and lozenges). We also discussed prescription options (Chantix, Nicotine Inhaler / Nasal Spray). The patient is not interested in pursuing any prescription tobacco cessation options at this time. - Patient declines at this time.   Essential hypertension Hypertension longstanding diagnosed currently amlodipine '10mg'$  daily , coreg 3.125 twice daily and HCTZ '25mg'$  current medications. BP Goal = 140/90 < mmHg. Patient is adherent with current medications.  -Continued  -Counseled on lifestyle modifications for blood pressure control including reduced dietary sodium, increased exercise, adequate sleep CMP, CBC, Lipid   Medication refill -     carvedilol (COREG) 3.125 MG tablet; Take 1 tablet (3.125 mg total) by mouth 2 (two) times daily. -     tamsulosin (FLOMAX) 0.4 MG CAPS capsule; Take 1 capsule (0.4 mg total) by mouth daily.  Benign prostatic hyperplasia with lower urinary tract symptoms, symptom details unspecified -     tamsulosin (FLOMAX) 0.4 MG CAPS capsule; Take 1 capsule (0.4 mg total) by mouth daily.   Robert Molina

## 2020-11-03 LAB — CMP14+EGFR
ALT: 24 IU/L (ref 0–44)
AST: 58 IU/L — ABNORMAL HIGH (ref 0–40)
Albumin/Globulin Ratio: 1.5 (ref 1.2–2.2)
Albumin: 4.8 g/dL (ref 3.8–4.8)
Alkaline Phosphatase: 95 IU/L (ref 44–121)
BUN/Creatinine Ratio: 5 — ABNORMAL LOW (ref 10–24)
BUN: 4 mg/dL — ABNORMAL LOW (ref 8–27)
Bilirubin Total: 0.4 mg/dL (ref 0.0–1.2)
CO2: 29 mmol/L (ref 20–29)
Calcium: 9.5 mg/dL (ref 8.6–10.2)
Chloride: 93 mmol/L — ABNORMAL LOW (ref 96–106)
Creatinine, Ser: 0.87 mg/dL (ref 0.76–1.27)
Globulin, Total: 3.1 g/dL (ref 1.5–4.5)
Glucose: 105 mg/dL — ABNORMAL HIGH (ref 65–99)
Potassium: 3.5 mmol/L (ref 3.5–5.2)
Sodium: 141 mmol/L (ref 134–144)
Total Protein: 7.9 g/dL (ref 6.0–8.5)
eGFR: 94 mL/min/{1.73_m2} (ref >59)

## 2020-11-03 LAB — CBC WITH DIFFERENTIAL/PLATELET
Basophils Absolute: 0 10*3/uL (ref 0.0–0.2)
Basos: 1 %
EOS (ABSOLUTE): 0.1 10*3/uL (ref 0.0–0.4)
Eos: 3 %
Hematocrit: 35.5 % — ABNORMAL LOW (ref 37.5–51.0)
Hemoglobin: 11.9 g/dL — ABNORMAL LOW (ref 13.0–17.7)
Immature Grans (Abs): 0 10*3/uL (ref 0.0–0.1)
Immature Granulocytes: 1 %
Lymphocytes Absolute: 0.9 10*3/uL (ref 0.7–3.1)
Lymphs: 32 %
MCH: 30.6 pg (ref 26.6–33.0)
MCHC: 33.5 g/dL (ref 31.5–35.7)
MCV: 91 fL (ref 79–97)
Monocytes Absolute: 0.8 10*3/uL (ref 0.1–0.9)
Monocytes: 29 %
Neutrophils Absolute: 1 10*3/uL — ABNORMAL LOW (ref 1.4–7.0)
Neutrophils: 34 %
Platelets: 234 10*3/uL (ref 150–450)
RBC: 3.89 x10E6/uL — ABNORMAL LOW (ref 4.14–5.80)
RDW: 15.9 % — ABNORMAL HIGH (ref 11.6–15.4)
WBC: 2.7 10*3/uL — ABNORMAL LOW (ref 3.4–10.8)

## 2020-11-03 LAB — LIPID PANEL
Chol/HDL Ratio: 1.9 ratio (ref 0.0–5.0)
Cholesterol, Total: 206 mg/dL — ABNORMAL HIGH (ref 100–199)
HDL: 106 mg/dL (ref >39)
LDL Chol Calc (NIH): 91 mg/dL (ref 0–99)
Triglycerides: 45 mg/dL (ref 0–149)
VLDL Cholesterol Cal: 9 mg/dL (ref 5–40)

## 2020-11-09 ENCOUNTER — Encounter (INDEPENDENT_AMBULATORY_CARE_PROVIDER_SITE_OTHER): Payer: Self-pay

## 2021-04-06 ENCOUNTER — Other Ambulatory Visit (INDEPENDENT_AMBULATORY_CARE_PROVIDER_SITE_OTHER): Payer: Self-pay | Admitting: Primary Care

## 2021-04-06 DIAGNOSIS — I10 Essential (primary) hypertension: Secondary | ICD-10-CM

## 2021-04-06 DIAGNOSIS — Z760 Encounter for issue of repeat prescription: Secondary | ICD-10-CM

## 2021-04-06 NOTE — Telephone Encounter (Signed)
Sent to PCP ?

## 2021-05-05 ENCOUNTER — Ambulatory Visit (INDEPENDENT_AMBULATORY_CARE_PROVIDER_SITE_OTHER): Payer: Medicare Other | Admitting: Primary Care

## 2021-05-16 ENCOUNTER — Other Ambulatory Visit: Payer: Self-pay

## 2021-05-16 ENCOUNTER — Encounter (INDEPENDENT_AMBULATORY_CARE_PROVIDER_SITE_OTHER): Payer: Self-pay | Admitting: Primary Care

## 2021-05-16 ENCOUNTER — Ambulatory Visit (INDEPENDENT_AMBULATORY_CARE_PROVIDER_SITE_OTHER): Payer: Medicare Other | Admitting: Primary Care

## 2021-05-16 VITALS — BP 112/77 | HR 89 | Temp 97.9°F | Ht 71.0 in | Wt 139.0 lb

## 2021-05-16 DIAGNOSIS — I10 Essential (primary) hypertension: Secondary | ICD-10-CM | POA: Diagnosis not present

## 2021-05-16 DIAGNOSIS — Z760 Encounter for issue of repeat prescription: Secondary | ICD-10-CM

## 2021-05-16 DIAGNOSIS — E782 Mixed hyperlipidemia: Secondary | ICD-10-CM

## 2021-05-16 DIAGNOSIS — N401 Enlarged prostate with lower urinary tract symptoms: Secondary | ICD-10-CM | POA: Diagnosis not present

## 2021-05-16 MED ORDER — HYDROCHLOROTHIAZIDE 25 MG PO TABS: 25.0000 mg | ORAL_TABLET | Freq: Every day | ORAL | 1 refills | Status: DC

## 2021-05-16 MED ORDER — AMLODIPINE BESYLATE 10 MG PO TABS: 10.0000 mg | ORAL_TABLET | Freq: Every day | ORAL | 1 refills | Status: DC

## 2021-05-16 MED ORDER — PRAVASTATIN SODIUM 20 MG PO TABS: 20.0000 mg | ORAL_TABLET | Freq: Every day | ORAL | 1 refills | Status: DC

## 2021-05-16 MED ORDER — CARVEDILOL 3.125 MG PO TABS: 3.1250 mg | ORAL_TABLET | Freq: Two times a day (BID) | ORAL | 1 refills | Status: DC

## 2021-05-16 MED ORDER — TAMSULOSIN HCL 0.4 MG PO CAPS: 0.4000 mg | ORAL_CAPSULE | Freq: Every day | ORAL | 1 refills | Status: DC

## 2021-05-16 NOTE — Progress Notes (Signed)
[place], is a 71 y.o. male  QMV:[id number]  MWU:[id number]  DOB - 01/28/1951  Chief Complaint  Patient presents with   Hypertension   Alcohol Problem    Drank 3 16oz beers last night        Subjective:   Robert Molina is a 71 y.o. male here today for a follow up visit. Patient has No headache, No chest pain, No abdominal pain - No Nausea, No new weakness tingling or numbness, No Cough - SOB. Patient admits he has cut down alcohol consumption to 3 (16-24 oz ) of beer a day.  No problems updated.  ALLERGIES: No Known Allergies  PAST MEDICAL HISTORY: Past Medical History:  Diagnosis Date   Alcohol abuse    Pneumonia    hx of about  52yrs ago    MEDICATIONS AT HOME: Prior to Admission medications   Medication Sig Start Date End Date Taking? Authorizing Provider  amLODipine (NORVASC) 10 MG tablet Take 1 tablet (10 mg total) by mouth daily. 05/16/21   Kerin Perna, NP  carvedilol (COREG) 3.125 MG tablet Take 1 tablet (3.125 mg total) by mouth 2 (two) times daily. 05/16/21 05/16/22  Kerin Perna, NP  hydrochlorothiazide (HYDRODIURIL) 25 MG tablet Take 1 tablet (25 mg total) by mouth daily. 05/16/21   Kerin Perna, NP  pravastatin (PRAVACHOL) 20 MG tablet Take 1 tablet (20 mg total) by mouth daily. 05/16/21   Kerin Perna, NP  tamsulosin (FLOMAX) 0.4 MG CAPS capsule Take 1 capsule (0.4 mg total) by mouth daily. 05/16/21   Kerin Perna, NP  thiamine 100 MG tablet Take 1 tablet (100 mg total) by mouth daily. Patient not taking: Reported on 05/16/2021 12/24/19   Kerin Perna, NP    Objective:   Vitals:   05/16/21 1556  BP: 112/77  Pulse: 89  Temp: 97.9 F (36.6 C)  TempSrc: Oral  SpO2: 96%  Weight: 139 lb (63 kg)  Height: 5\' 11"  (1.803 m)   Exam General appearance : Awake, alert, not in any distress. Speech Clear. Not toxic looking HEENT: Atraumatic and Normocephalic, pupils equally reactive to light  and accomodation Neck: Supple, no JVD. No cervical lymphadenopathy.  Chest: Good air entry bilaterally, no added sounds  CVS: S1 S2 regular, no murmurs.  Abdomen: Bowel sounds present, Non tender and not distended with no gaurding, rigidity or rebound. Extremities: B/L Lower Ext shows no edema, both legs are warm to touch Neurology: Awake alert, and oriented X 3, CN II-XII intact, Non focal Skin: No Rash  Data Review No results found for: HGBA1C  Assessment & Plan   1. Medication refill - tamsulosin (FLOMAX) 0.4 MG CAPS capsule; Take 1 capsule (0.4 mg total) by mouth daily.  Dispense: 90 capsule; Refill: 1 - pravastatin (PRAVACHOL) 20 MG tablet; Take 1 tablet (20 mg total) by mouth daily.  Dispense: 90 tablet; Refill: 1 - hydrochlorothiazide (HYDRODIURIL) 25 MG tablet; Take 1 tablet (25 mg total) by mouth daily.  Dispense: 90 tablet; Refill: 1 - carvedilol (COREG) 3.125 MG tablet; Take 1 tablet (3.125 mg total) by mouth 2 (two) times daily.  Dispense: 180 tablet; Refill: 1 - amLODipine (NORVASC) 10 MG tablet; Take 1 tablet (10 mg total) by mouth daily.  Dispense: 90 tablet; Refill: 1  2. Benign prostatic hyperplasia with lower urinary tract symptoms, symptom details unspecified - tamsulosin (FLOMAX) 0.4 MG CAPS capsule; Take 1 capsule (0.4 mg total) by mouth daily.  Dispense: 90  capsule; Refill: 1  3. Essential hypertension Blood pressure is at goal of less than 130/80, low-sodium, DASH diet, medication compliance, 150 minutes of moderate intensity exercise per week. Discussed medication compliance, adverse effects.   (On triple therapy) - hydrochlorothiazide (HYDRODIURIL) 25 MG tablet; Take 1 tablet (25 mg total) by mouth daily.  Dispense: 90 tablet; Refill: 1 - carvedilol (COREG) 3.125 MG tablet; Take 1 tablet (3.125 mg total) by mouth 2 (two) times daily.  Dispense: 180 tablet; Refill: 1 - amLODipine (NORVASC) 10 MG tablet; Take 1 tablet (10 mg total) by mouth daily.  Dispense: 90  tablet; Refill: 1  4. Mixed hyperlipidemia  Healthy lifestyle diet of fruits vegetables fish nuts whole grains and low saturated fat . Foods high in cholesterol or liver, fatty meats,cheese, butter avocados, nuts and seeds, chocolate and fried foods. -     pravastatin (PRAVACHOL) 20 MG tablet; Take 1 tablet (20 mg total) by mouth daily.  Patient have been counseled extensively about nutrition and exercise. Other issues discussed during this visit include: low cholesterol diet, weight control and daily exercise, foot care, annual eye examinations at Ophthalmology, importance of adherence with medications and regular follow-up. We also discussed long term complications of uncontrolled diabetes and hypertension.   Return in about 3 months (around 08/13/2021) for BP f/u and fasting labs.  The patient was given clear instructions to go to ER or return to medical center if symptoms don't improve, worsen or new problems develop. The patient verbalized understanding. The patient was told to call to get lab results if they haven't heard anything in the next week.   This note has been created with Surveyor, quantity. Any transcriptional errors are unintentional.   Kerin Perna, NP 05/21/2021, 1:32 PM

## 2021-06-09 ENCOUNTER — Encounter (INDEPENDENT_AMBULATORY_CARE_PROVIDER_SITE_OTHER): Payer: Self-pay

## 2021-06-09 ENCOUNTER — Ambulatory Visit (INDEPENDENT_AMBULATORY_CARE_PROVIDER_SITE_OTHER): Payer: Medicare Other

## 2021-06-09 DIAGNOSIS — Z0000 Encounter for general adult medical examination without abnormal findings: Secondary | ICD-10-CM | POA: Diagnosis not present

## 2021-06-09 NOTE — Patient Instructions (Signed)

## 2021-06-09 NOTE — Progress Notes (Signed)
Subjective:   Robert Molina is a 71 y.o. male who presents for Medicare Annual/Subsequent preventive examination.  I connected with  Robert Molina on 06/09/21 by a audio enabled telemedicine application and verified that I am speaking with the correct person using two identifiers.  Patient Location: Home  Provider Location: Home Office  I discussed the limitations of evaluation and management by telemedicine. The patient expressed understanding and agreed to proceed.        Objective:    There were no vitals filed for this visit. There is no height or weight on file to calculate BMI.  Advanced Directives 08/05/2019 05/07/2013 04/30/2013  Does Patient Have a Medical Advance Directive? No Patient does not have advance directive;Patient would not like information Patient does not have advance directive;Patient would not like information  Would patient like information on creating a medical advance directive? No - Patient declined - -  Pre-existing out of facility DNR order (yellow form or pink MOST form) - No No    Current Medications (verified) Outpatient Encounter Medications as of 06/09/2021  Medication Sig   amLODipine (NORVASC) 10 MG tablet Take 1 tablet (10 mg total) by mouth daily.   carvedilol (COREG) 3.125 MG tablet Take 1 tablet (3.125 mg total) by mouth 2 (two) times daily.   hydrochlorothiazide (HYDRODIURIL) 25 MG tablet Take 1 tablet (25 mg total) by mouth daily.   pravastatin (PRAVACHOL) 20 MG tablet Take 1 tablet (20 mg total) by mouth daily.   tamsulosin (FLOMAX) 0.4 MG CAPS capsule Take 1 capsule (0.4 mg total) by mouth daily.   thiamine 100 MG tablet Take 1 tablet (100 mg total) by mouth daily. (Patient not taking: Reported on 05/16/2021)   No facility-administered encounter medications on file as of 06/09/2021.    Allergies (verified) Patient has no known allergies.   History: Past Medical History:  Diagnosis Date   Alcohol abuse    Pneumonia    hx of  about  71yrs ago   Past Surgical History:  Procedure Laterality Date   PAROTIDECTOMY Right 05/06/2013   Procedure: PAROTIDECTOMY;  Surgeon: Ruby Cola, MD;  Location: [facility];  Service: ENT;  Laterality: Right;  With Nerve Monitor   RADICAL NECK DISSECTION Right 05/06/2013   Procedure: RADICAL NECK DISSECTION;  Surgeon: Ruby Cola, MD;  Location[facility];  Service: ENT;  Laterality: Right;   Family History  Problem Relation Age of Onset   Diabetes type II Neg Hx    Social History   Socioeconomic History   Marital status: Single    Spouse name: Not on file   Number of children: Not on file   Years of education: Not on file   Highest education level: Not on file  Occupational History   Not on file  Tobacco Use   Smoking status: Every Day    Packs/day: 1.00    Years: 44.00    Pack years: 44.00    Types: Cigarettes   Smokeless tobacco: Never  Substance and Sexual Activity   Alcohol use: Yes    Comment: daily  1-2 beers   Drug use: No   Sexual activity: Never  Other Topics Concern   Not on file  Social History Narrative   Not on file   Social Determinants of Health   Financial Resource Strain: Not on file  Food Insecurity: Not on file  Transportation Needs: Not on file  Physical Activity: Not on file  Stress: Not on file  Social Connections: Not  on file    Tobacco Counseling Ready to quit: Not Answered Counseling given: Not Answered   Clinical Intake:                 Diabetic?No         Activities of Daily Living No flowsheet data found.  Patient Care Team: Kerin Perna, NP as PCP - General (Internal Medicine)  Indicate any recent Medical Services you may have received from other than [facility] providers in the past year (date may be approximate).     Assessment:   This is a routine wellness examination for Robert Molina.  Hearing/Vision screen No results found.  Dietary issues and exercise activities discussed:     Goals Addressed    None   Depression Screen PHQ 2/9 Scores 05/16/2021 11/02/2020 08/02/2020 06/13/2020 03/15/2020 01/14/2020 12/24/2019  PHQ - 2 Score 0 - 0 0 0 - 0  Exception Documentation - Patient refusal - - - Patient refusal -    Fall Risk Fall Risk  05/16/2021 11/02/2020 08/02/2020 06/13/2020 03/15/2020  Falls in the past year? 0 0 0 0 0  Number falls in past yr: - - - - -  Injury with Fall? - - - - -    [place]:  Any stairs in or around the home? No  If so, are there any without handrails?  na Home free of loose throw rugs in walkways, pet beds, electrical cords, etc? No  Adequate lighting in your home to reduce risk of falls? Yes   ASSISTIVE DEVICES UTILIZED TO PREVENT FALLS:  Life alert? No  Use of a cane, walker or w/c? No  Grab bars in the bathroom? No  Shower chair or bench in shower? No  Elevated toilet seat or a handicapped toilet? No         Immunizations Immunization History  Administered Date(s) Administered   Influenza,inj,Quad PF,6+ Mos 05/08/2013, 12/24/2019   Pneumococcal Conjugate-13 01/14/2020   Pneumococcal Polysaccharide-23 05/08/2013   Tdap 12/24/2019    TDAP status: Up to date  Flu Vaccine status: Up to date  Pneumococcal vaccine status: Up to date  Covid-19 vaccine status: Completed vaccines  Qualifies for Shingles Vaccine? Yes   Zostavax completed No   Shingrix Completed?: No.    Education has been provided regarding the importance of this vaccine. Patient has been advised to call insurance company to determine out of pocket expense if they have not yet received this vaccine. Advised may also receive vaccine at local pharmacy or Health Dept. Verbalized acceptance and understanding.  Screening Tests Health Maintenance  Topic Date Due   COVID-19 Vaccine (1) Never done   Zoster Vaccines- Shingrix (1 of 2) Never done   INFLUENZA VACCINE  07/07/2021 (Originally 11/07/2020)   Pneumonia Vaccine 38+ Years old (3 - PPSV23 if available,  else PCV20) 05/16/2022 (Originally 01/13/2021)   COLONOSCOPY (Pts 45-18yrs Insurance coverage will need to be confirmed)  05/16/2022 (Originally 09/20/1997)   TETANUS/TDAP  12/23/2029   Hepatitis C Screening  Completed   HPV VACCINES  Aged Out    Health Maintenance  Health Maintenance Due  Topic Date Due   COVID-19 Vaccine (1) Never done   Zoster Vaccines- Shingrix (1 of 2) Never done    Patient declines Colonoscopy  Lung Cancer Screening: (Low Dose CT Chest recommended if Age 58-80 years, 30 pack-year currently smoking OR have quit w/in 15years.) does qualify.   Lung Cancer Screening Referral: refused  Additional Screening:  Hepatitis C  Screening: does qualify; Completed yes  Vision Screening: Recommended annual ophthalmology exams for early detection of glaucoma and other disorders of the eye. Is the patient up to date with their annual eye exam?  No  Who is the provider or what is the name of the office in which the patient attends annual eye exams? refuse If pt is not established with a provider, would they like to be referred to a provider to establish care?  NA  .   Dental Screening: Recommended annual dental exams for proper oral hygiene  Community Resource Referral / Chronic Care Management: CRR required this visit?  No   CCM required this visit?  No      Plan:     I have personally reviewed and noted the following in the patients chart:   Medical and social history Use of alcohol, tobacco or illicit drugs  Current medications and supplements including opioid prescriptions. Patient is not currently taking opioid prescriptions. Functional ability and status Nutritional status Physical activity Advanced directives List of other physicians Hospitalizations, surgeries, and ER visits in previous 12 months Vitals Screenings to include cognitive, depression, and falls Referrals and appointments  In addition, I have reviewed and discussed with patient certain  preventive protocols, quality metrics, and best practice recommendations. A written personalized care plan for preventive services as well as general preventive health recommendations were provided to patient.     Octavio Manns   06/09/2021

## 2021-08-15 ENCOUNTER — Ambulatory Visit (INDEPENDENT_AMBULATORY_CARE_PROVIDER_SITE_OTHER): Payer: Medicare Other | Admitting: Primary Care

## 2021-08-31 ENCOUNTER — Ambulatory Visit (INDEPENDENT_AMBULATORY_CARE_PROVIDER_SITE_OTHER): Payer: Medicare Other | Admitting: Primary Care

## 2021-08-31 ENCOUNTER — Encounter (INDEPENDENT_AMBULATORY_CARE_PROVIDER_SITE_OTHER): Payer: Self-pay | Admitting: Primary Care

## 2021-08-31 VITALS — BP 126/82 | HR 98 | Temp 97.8°F | Ht 71.0 in | Wt 127.2 lb

## 2021-08-31 DIAGNOSIS — I10 Essential (primary) hypertension: Secondary | ICD-10-CM

## 2021-08-31 DIAGNOSIS — B351 Tinea unguium: Secondary | ICD-10-CM

## 2021-08-31 DIAGNOSIS — E782 Mixed hyperlipidemia: Secondary | ICD-10-CM

## 2021-08-31 NOTE — Progress Notes (Signed)
Robert Molina is a 71 y.o. male presents for hypertension evaluation, Denies shortness of breath, headaches, chest pain or lower extremity edema, sudden onset, vision changes, unilateral weakness, dizziness, paresthesias . He is complaing of his claw nails hurting to put sox on and shoes requesting to go back to toe doctor.  Patient reports adherence with medications.  Dietary habits include: monitoring sodium Exercise habits include:walking Family / Social history: none   Past Medical History:  Diagnosis Date   Alcohol abuse    Pneumonia    hx of about  76yr ago   Past Surgical History:  Procedure Laterality Date   PAROTIDECTOMY Right 05/06/2013   Procedure: PAROTIDECTOMY;  Surgeon: MRuby Cola MD;  Location[place]  Service: ENT;  Laterality: Right;  With Nerve Monitor   RADICAL NECK DISSECTION Right 05/06/2013   Procedure: RADICAL NECK DISSECTION;  Surgeon: MRuby Cola MD;  [place]  Service: ENT;  Laterality: Right;   No Known Allergies Current Outpatient Medications on File Prior to Visit  Medication Sig Dispense Refill   amLODipine (NORVASC) 10 MG tablet Take 1 tablet (10 mg total) by mouth daily. 90 tablet 1   carvedilol (COREG) 3.125 MG tablet Take 1 tablet (3.125 mg total) by mouth 2 (two) times daily. 180 tablet 1   hydrochlorothiazide (HYDRODIURIL) 25 MG tablet Take 1 tablet (25 mg total) by mouth daily. 90 tablet 1   pravastatin (PRAVACHOL) 20 MG tablet Take 1 tablet (20 mg total) by mouth daily. 90 tablet 1   tamsulosin (FLOMAX) 0.4 MG CAPS capsule Take 1 capsule (0.4 mg total) by mouth daily. 90 capsule 1   No current facility-administered medications on file prior to visit.   Social History   Socioeconomic History   Marital status: Single    Spouse name: Not on file   Number of children: Not on file   Years of education: Not on file   Highest education level: Not on file  Occupational History   Not on file   Tobacco Use   Smoking status: Every Day    Packs/day: 1.00    Years: 44.00    Pack years: 44.00    Types: Cigarettes   Smokeless tobacco: Never  Vaping Use   Vaping Use: Not on file  Substance and Sexual Activity   Alcohol use: Yes    Comment: daily  1-2 beers   Drug use: No   Sexual activity: Never  Other Topics Concern   Not on file  Social History Narrative   Not on file   Social Determinants of Health   Financial Resource Strain: Low Risk    Difficulty of Paying Living Expenses: Not hard at all  Food Insecurity: No Food Insecurity   Worried About RCharity fundraiserin the Last Year: Never true   Ran Out of Food in the Last Year: Never true  Transportation Needs: No Transportation Needs   Lack of Transportation (Medical): No   Lack of Transportation (Non-Medical): No  Physical Activity: Sufficiently Active   Days of Exercise per Week: 7 days   Minutes of Exercise per Session: 30 min  Stress: No Stress Concern Present   Feeling of Stress : Not at all  Social Connections: Socially Isolated   Frequency of Communication with Friends and Family: Three times a week   Frequency of Social Gatherings with Friends and Family: Never   Attends Religious Services: Never   AMarine scientistor Organizations: No  Attends Archivist Meetings: Never   Marital Status: Never married  Human resources officer Violence: Not At Risk   Fear of Current or Ex-Partner: No   Emotionally Abused: No   Physically Abused: No   Sexually Abused: No   Family History  Problem Relation Age of Onset   Diabetes type II Neg Hx      OBJECTIVE:  Vitals:   08/31/21 1053  BP: 126/82  Pulse: 98  Temp: 97.8 F (36.6 C)  TempSrc: Oral  SpO2: 95%  Weight: 127 lb 3.2 oz (57.7 kg)  Height: 5' 11"  (1.803 m)    Physical Exam Vitals:   08/31/21 1053  BP: 126/82  Pulse: 98  Temp: 97.8 F (36.6 C)  TempSrc: Oral  SpO2: 95%  Weight: 127 lb 3.2 oz (57.7 kg)  Height: 5' 11"  (1.803  m)   General: Vital signs reviewed.  Patient is well-developed and thin frame, in no acute distress and cooperative with exam.  Head: Normocephalic and atraumatic. Eyes: EOMI, conjunctivae normal, no scleral icterus.  Neck: Supple, trachea midline, normal ROM, no JVD, masses, thyromegaly, or carotid bruit present.  Cardiovascular: RRR, S1 normal, S2 normal, no murmurs, gallops, or rubs. Pulmonary/Chest: Clear to auscultation bilaterally, no wheezes, rales, or rhonchi. Abdominal: Soft, non-tender, non-distended, BS +, no masses, organomegaly, or guarding present.  Musculoskeletal: No joint deformities, erythema, or stiffness, ROM full and nontender. Extremities: No lower extremity edema bilaterally,  pulses symmetric and intact bilaterally. No cyanosis or clubbing. Neurological: A&O x3, Strength is normal and symmetric bilaterally,, no focal motor deficit, sensory intact to light touch bilaterally.  Skin: Warm, dry and intact. No rashes or erythema. Psychiatric: Normal mood and affect. speech and behavior is normal. Cognition and memory are normal.    ROS Comprehensive ROS Pertinent positive and negative noted in HPI   Last 3 Office BP readings: BP Readings from Last 3 Encounters:  08/31/21 126/82  05/16/21 112/77  11/02/20 124/77    BMET    Component Value Date/Time   NA 141 11/02/2020 1154   K 3.5 11/02/2020 1154   CL 93 (L) 11/02/2020 1154   CO2 29 11/02/2020 1154   GLUCOSE 105 (H) 11/02/2020 1154   GLUCOSE 117 (H) 11/13/2019 0853   BUN 4 (L) 11/02/2020 1154   CREATININE 0.87 11/02/2020 1154   CALCIUM 9.5 11/02/2020 1154   GFRNONAA 86 03/15/2020 1541   GFRAA 99 03/15/2020 1541    Renal function: CrCl cannot be calculated (Patient's most recent lab result is older than the maximum 21 days allowed.).  Clinical ASCVD: Yes  The ASCVD Risk score (Arnett DK, et al., 2019) failed to calculate for the following reasons:   The valid HDL cholesterol range is 20 to 100  mg/dL  ASCVD risk factors include- [place]   ASSESSMENT & PLAN: Zadkiel was seen today for hypertension.  Diagnoses and all orders for this visit:  Essential hypertension Bp well control-   For patients 60 and older: Goal BP < 140/90. -Counseled on lifestyle modifications for blood pressure control including reduced dietary sodium, increased exercise, weight reduction and adequate sleep. Also, educated patient about the risk for cardiovascular events, stroke and heart attack. Also counseled patient about the importance of medication adherence. If you participate in smoking, it is important to stop using tobacco as this will increase the risks associated with uncontrolled blood pressure. Minimize alcohol intake- continue to drink beer    CMP14+EGFR -     CBC with Differential  Mixed hyperlipidemia  Healthy lifestyle diet of fruits vegetables fish nuts whole grains and low saturated fat . Foods high in cholesterol or liver, fatty meats,cheese, butter avocados, nuts and seeds, chocolate and fried foods. -     Lipid Panel  Onychomycosis elongated toenails Minimize salt intake.   This note has been created with Surveyor, quantity. Any transcriptional errors are unintentional.   Kerin Perna, NP 08/31/2021, 10:59 AM

## 2021-09-01 LAB — CMP14+EGFR
ALT: 6 IU/L (ref 0–44)
AST: 16 IU/L (ref 0–40)
Albumin/Globulin Ratio: 0.8 — ABNORMAL LOW (ref 1.2–2.2)
Albumin: 3.5 g/dL — ABNORMAL LOW (ref 3.8–4.8)
Alkaline Phosphatase: 162 IU/L — ABNORMAL HIGH (ref 44–121)
BUN/Creatinine Ratio: 13 (ref 10–24)
BUN: 10 mg/dL (ref 8–27)
Bilirubin Total: 1.1 mg/dL (ref 0.0–1.2)
CO2: 23 mmol/L (ref 20–29)
Calcium: 9.4 mg/dL (ref 8.6–10.2)
Chloride: 83 mmol/L — ABNORMAL LOW (ref 96–106)
Creatinine, Ser: 0.77 mg/dL (ref 0.76–1.27)
Globulin, Total: 4.2 g/dL (ref 1.5–4.5)
Glucose: 101 mg/dL — ABNORMAL HIGH (ref 70–99)
Potassium: 4.5 mmol/L (ref 3.5–5.2)
Sodium: 126 mmol/L — ABNORMAL LOW (ref 134–144)
Total Protein: 7.7 g/dL (ref 6.0–8.5)
eGFR: 98 mL/min/{1.73_m2} (ref >59)

## 2021-09-01 LAB — CBC WITH DIFFERENTIAL/PLATELET
Basophils Absolute: 0 10*3/uL (ref 0.0–0.2)
Basos: 0 %
EOS (ABSOLUTE): 0 10*3/uL (ref 0.0–0.4)
Eos: 0 %
Hematocrit: 41.7 % (ref 37.5–51.0)
Hemoglobin: 14.3 g/dL (ref 13.0–17.7)
Immature Grans (Abs): 0.1 10*3/uL (ref 0.0–0.1)
Immature Granulocytes: 1 %
Lymphocytes Absolute: 2 10*3/uL (ref 0.7–3.1)
Lymphs: 27 %
MCH: 29.1 pg (ref 26.6–33.0)
MCHC: 34.3 g/dL (ref 31.5–35.7)
MCV: 85 fL (ref 79–97)
Monocytes Absolute: 0.6 10*3/uL (ref 0.1–0.9)
Monocytes: 8 %
Neutrophils Absolute: 4.7 10*3/uL (ref 1.4–7.0)
Neutrophils: 64 %
Platelets: 303 10*3/uL (ref 150–450)
RBC: 4.91 x10E6/uL (ref 4.14–5.80)
RDW: 14.5 % (ref 11.6–15.4)
WBC: 7.4 10*3/uL (ref 3.4–10.8)

## 2021-09-01 LAB — LIPID PANEL
Chol/HDL Ratio: 3.3 ratio (ref 0.0–5.0)
Cholesterol, Total: 153 mg/dL (ref 100–199)
HDL: 47 mg/dL (ref >39)
LDL Chol Calc (NIH): 90 mg/dL (ref 0–99)
Triglycerides: 87 mg/dL (ref 0–149)
VLDL Cholesterol Cal: 16 mg/dL (ref 5–40)

## 2021-09-07 ENCOUNTER — Telehealth (INDEPENDENT_AMBULATORY_CARE_PROVIDER_SITE_OTHER): Payer: Self-pay

## 2021-09-07 NOTE — Telephone Encounter (Signed)
-----   Message from Kerin Perna, NP sent at 09/06/2021  4:49 PM EDT ----- Abnormal labs are caused by underlying problem alcohol use. Not eating healthy diet with meat and vegetables. alkaline phosphatase elevated. Please ask him to avoid foods such as processed cheeses, yogurt, soda, processed meat, fast food, nuts, beans, lentils

## 2021-09-07 NOTE — Telephone Encounter (Signed)
Patient is aware of all results and advise per PCP. Nat Christen, CMA

## 2021-09-08 ENCOUNTER — Ambulatory Visit: Payer: Medicare Other | Admitting: Podiatry

## 2021-09-27 ENCOUNTER — Ambulatory Visit (INDEPENDENT_AMBULATORY_CARE_PROVIDER_SITE_OTHER): Payer: Medicare Other | Admitting: Podiatry

## 2021-09-27 ENCOUNTER — Encounter: Payer: Self-pay | Admitting: Podiatry

## 2021-09-27 DIAGNOSIS — M79675 Pain in left toe(s): Secondary | ICD-10-CM | POA: Diagnosis not present

## 2021-09-27 DIAGNOSIS — B351 Tinea unguium: Secondary | ICD-10-CM

## 2021-09-27 DIAGNOSIS — L84 Corns and callosities: Secondary | ICD-10-CM

## 2021-09-27 DIAGNOSIS — M79674 Pain in right toe(s): Secondary | ICD-10-CM | POA: Diagnosis not present

## 2021-09-28 NOTE — Progress Notes (Signed)
Subjective:   Patient ID: Robert Molina, male   DOB: 71 y.o.   MRN: [id number]   HPI Patient presents with severe nail disease 1-5 both feet that are very thick and lesions bottom fifth metatarsal both feet that are keratotic painful when pressed make walking difficult.  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

## 2021-12-01 ENCOUNTER — Other Ambulatory Visit (INDEPENDENT_AMBULATORY_CARE_PROVIDER_SITE_OTHER): Payer: Self-pay | Admitting: Primary Care

## 2021-12-01 DIAGNOSIS — E782 Mixed hyperlipidemia: Secondary | ICD-10-CM

## 2021-12-01 DIAGNOSIS — I10 Essential (primary) hypertension: Secondary | ICD-10-CM

## 2021-12-01 DIAGNOSIS — Z760 Encounter for issue of repeat prescription: Secondary | ICD-10-CM

## 2021-12-01 NOTE — Telephone Encounter (Signed)
Routed to PCP 

## 2022-03-06 ENCOUNTER — Ambulatory Visit (INDEPENDENT_AMBULATORY_CARE_PROVIDER_SITE_OTHER): Payer: Medicare Other | Admitting: Primary Care

## 2022-07-27 IMAGING — CT CT HEAD W/O CM
4 series · 16 of 47 positions shown, 18 images · non-contrast
Comparison: None.

CLINICAL DATA: Unexplained fall

EXAM:
CT HEAD WITHOUT CONTRAST
TECHNIQUE: Contiguous axial images were obtained from the base of the skull
through the vertex without intravenous contrast.

[Series 3: head without · axial · non-contrast · 0.44mm/px · z∈[-121,+4]mm · 7 of 35 slices shown, 9 images]
[im 5/35  brain]
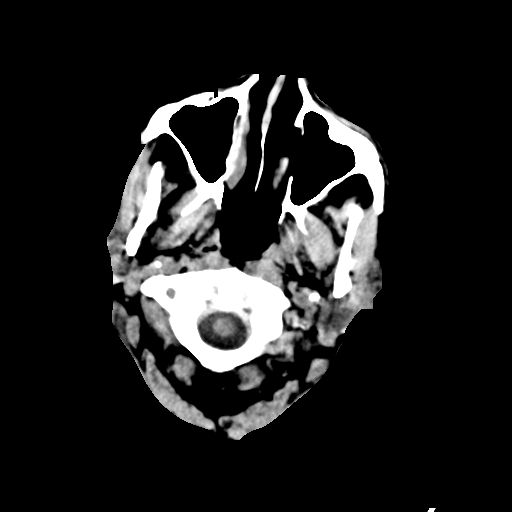
[im 5/35  bone]
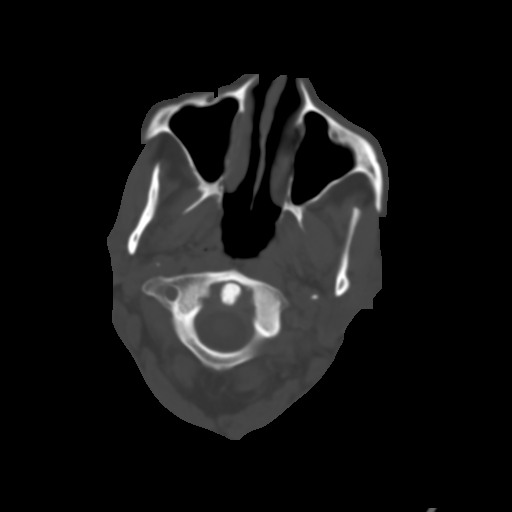
[im 9/35  brain]
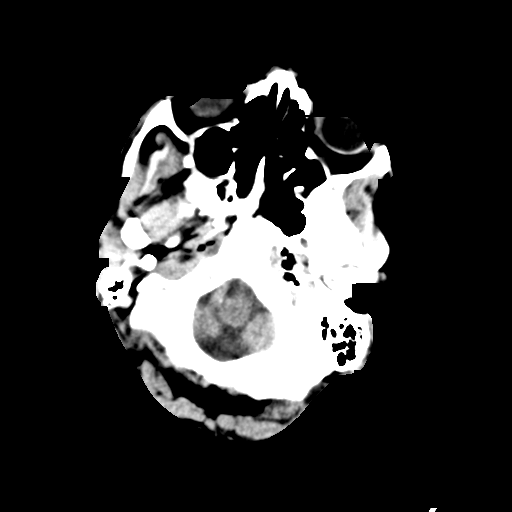
[im 13/35  brain]
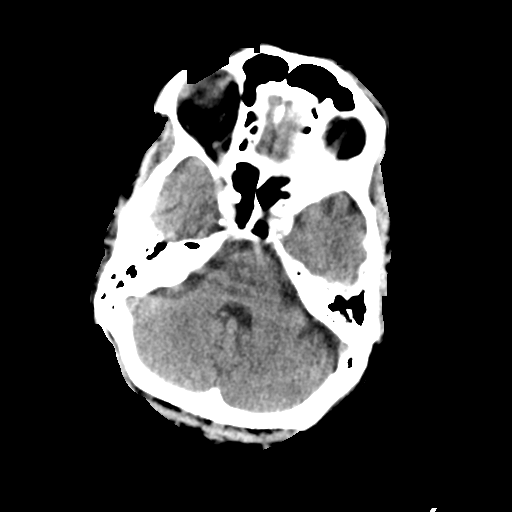
[im 18/35  brain]
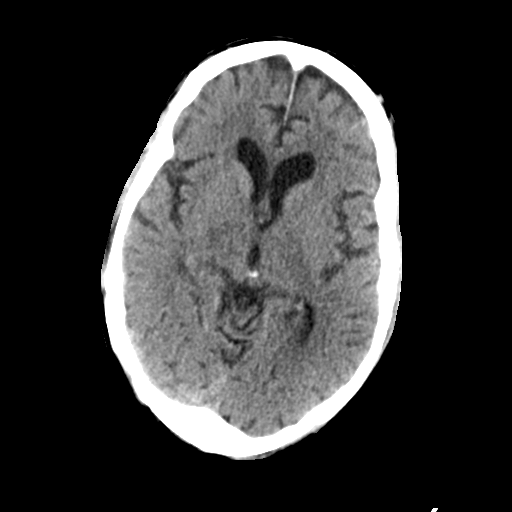
[im 22/35  brain]
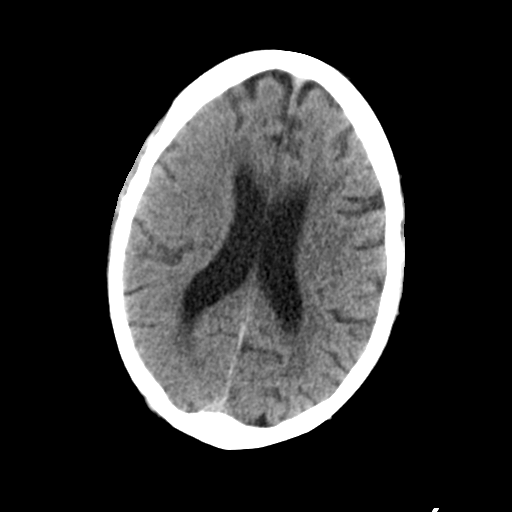
[im 22/35  bone]
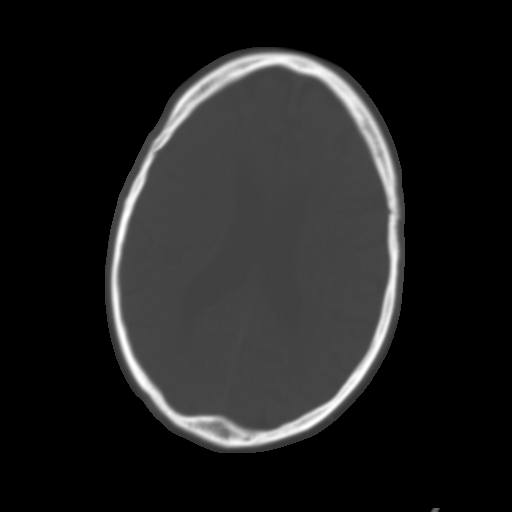
[im 26/35  brain]
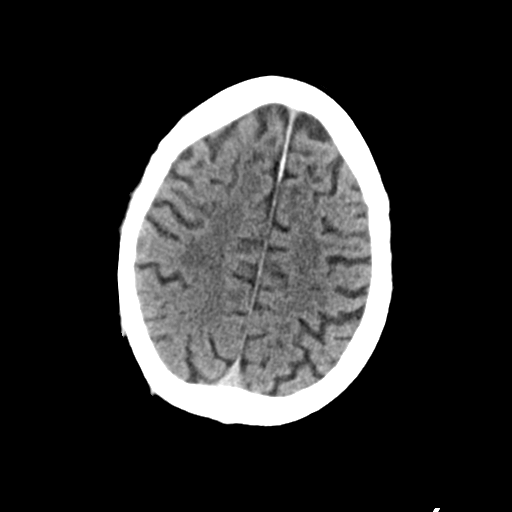
[im 30/35  brain]
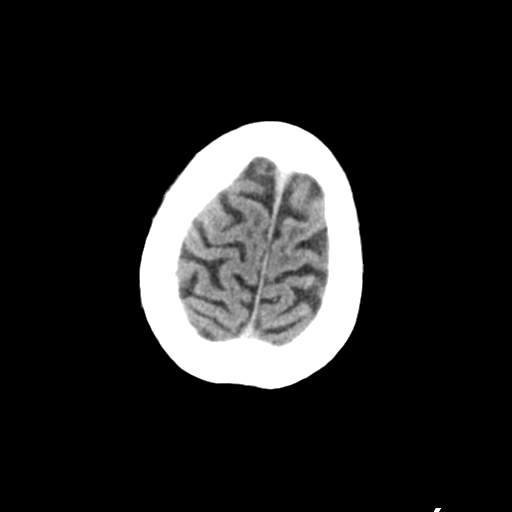

[Series 4: head bone · axial · 0.44mm/px · z∈[-125,-89]mm · 3 of 88 slices shown]
[im 9/88  bone]
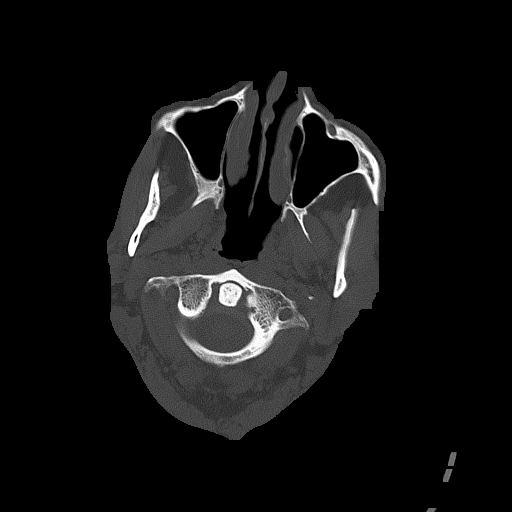
[im 18/88  bone]
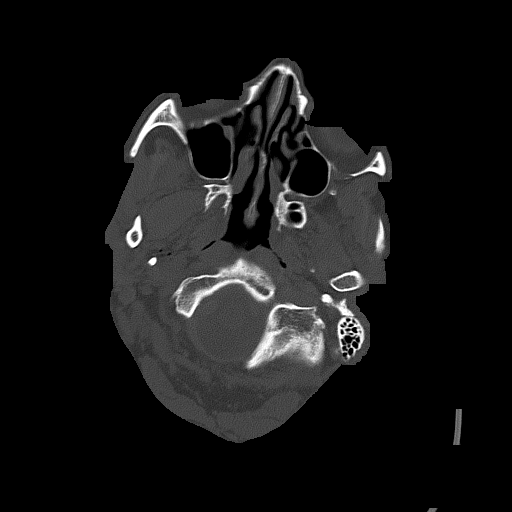
[im 27/88  bone]
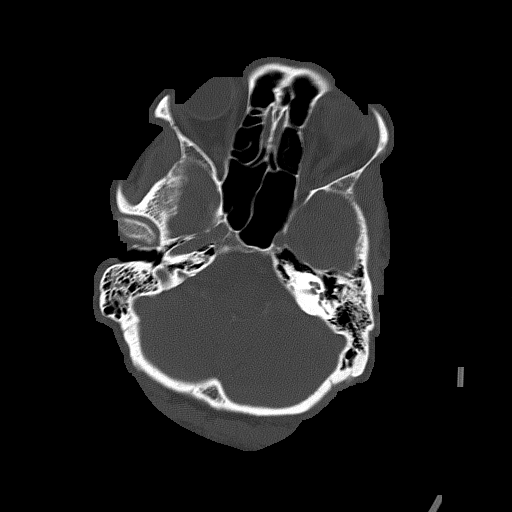

[Series 5: head without cor · coronal · non-contrast · 0.32mm/px · 3 of 73 slices shown]
[im 25/73  brain]
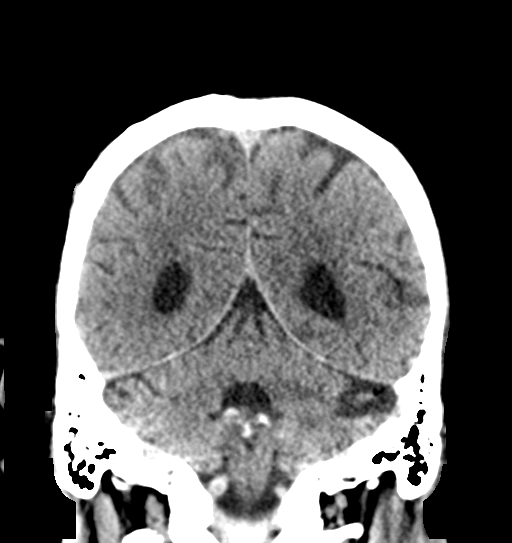
[im 33/73  brain]
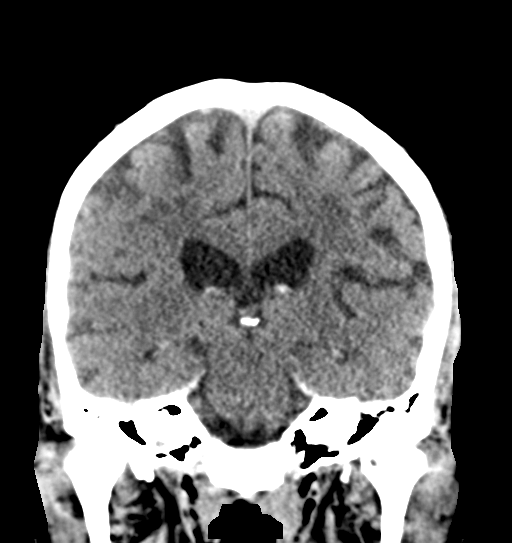
[im 41/73  brain]
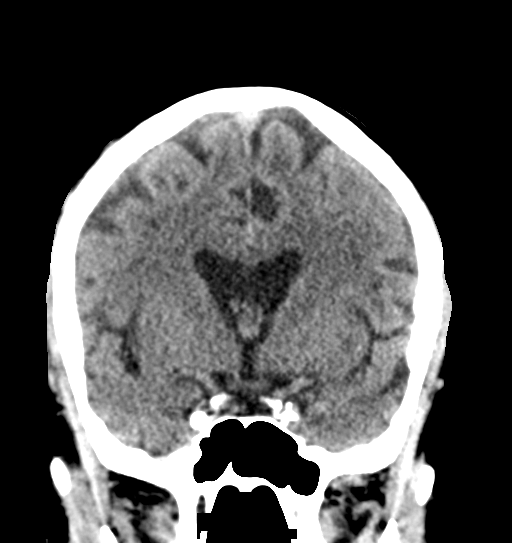

[Series 6: head without sag · sagittal · non-contrast · 0.30mm/px · 3 of 54 slices shown]
[im 18/54  brain]
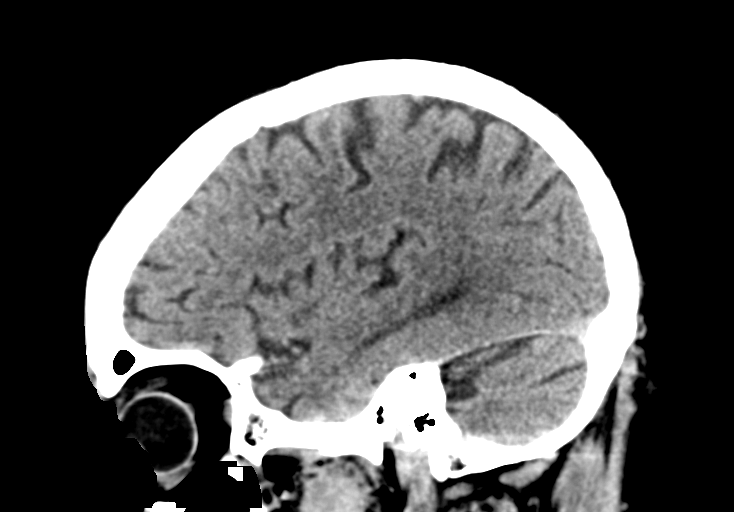
[im 27/54  brain]
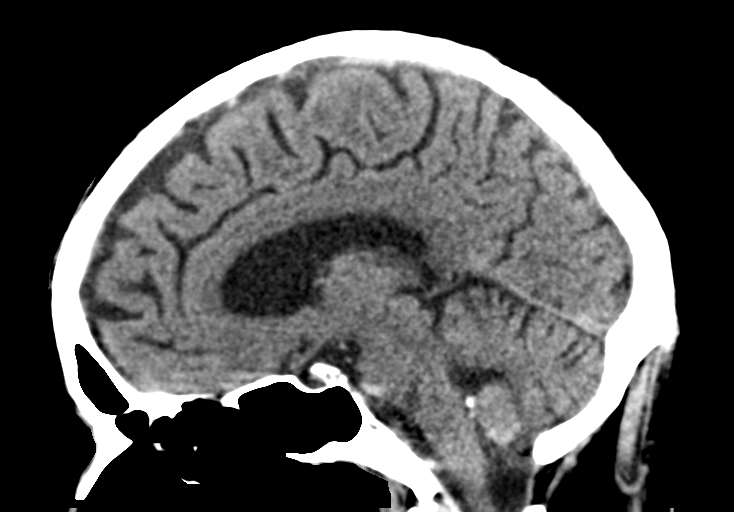
[im 36/54  brain]
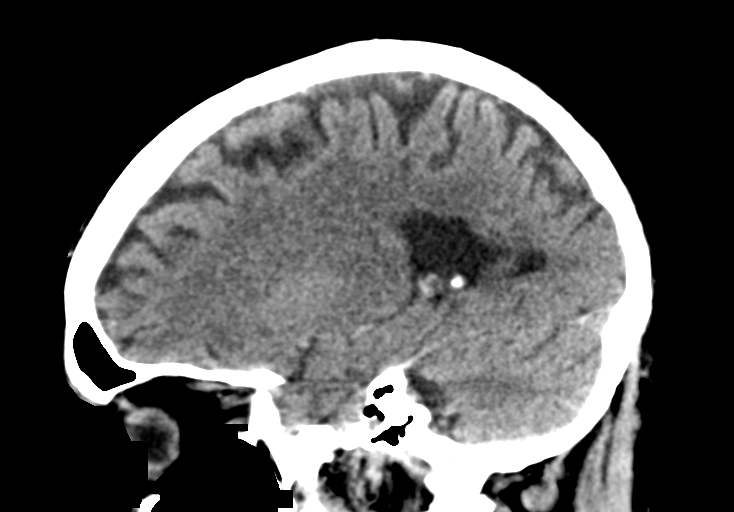

[16 of 47 positions shown; findings below may reference images not displayed]

FINDINGS: Brain: There is no acute intracranial hemorrhage, mass effect, or
edema. Gray-white differentiation is preserved. There is no
extra-axial fluid collection. Ventricles and sulci are within normal
limits in size and configuration. Patchy hypoattenuation in the
supratentorial white matter is nonspecific may reflect mild chronic
microvascular ischemic changes.

Vascular: There is atherosclerotic calcification at the skull base.

Skull: Calvarium is unremarkable.

Sinuses/Orbits: No acute finding.

Other: None.
IMPRESSION: No evidence of acute intracranial injury. Chronic microvascular
ischemic changes.

## 2022-08-02 ENCOUNTER — Telehealth (INDEPENDENT_AMBULATORY_CARE_PROVIDER_SITE_OTHER): Payer: Self-pay | Admitting: Primary Care

## 2022-08-02 NOTE — Telephone Encounter (Signed)
Called patient to schedule Medicare Annual Wellness Visit (AWV). No voicemail available to leave a message.  Last date of AWV: 06/09/2021   Please schedule an Virtual appointment at any time with Abby, NHA.  If any questions, please contact me at [phone].  Thank you,  Judeth Cornfield,  AMB Clinical Support [facility] AWV Program Direct Dial ??[phone]

## 2022-08-23 ENCOUNTER — Encounter (INDEPENDENT_AMBULATORY_CARE_PROVIDER_SITE_OTHER): Payer: Self-pay

## 2022-12-01 ENCOUNTER — Inpatient Hospital Stay (HOSPITAL_COMMUNITY): Payer: Medicare Other

## 2022-12-01 ENCOUNTER — Encounter (HOSPITAL_COMMUNITY): Payer: Self-pay

## 2022-12-01 ENCOUNTER — Emergency Department (HOSPITAL_COMMUNITY): Payer: Medicare Other

## 2022-12-01 ENCOUNTER — Inpatient Hospital Stay (HOSPITAL_COMMUNITY)
Admission: EM | Admit: 2022-12-01 | Discharge: 2022-12-03 | DRG: 871 | Disposition: A | Discharge status: Hospice | Payer: Medicare Other | Attending: Internal Medicine | Admitting: Internal Medicine

## 2022-12-01 ENCOUNTER — Other Ambulatory Visit: Payer: Self-pay

## 2022-12-01 DIAGNOSIS — C787 Secondary malignant neoplasm of liver and intrahepatic bile duct: Secondary | ICD-10-CM | POA: Diagnosis present

## 2022-12-01 DIAGNOSIS — T796XXA Traumatic ischemia of muscle, initial encounter: Secondary | ICD-10-CM | POA: Diagnosis present

## 2022-12-01 DIAGNOSIS — E870 Hyperosmolality and hypernatremia: Secondary | ICD-10-CM | POA: Diagnosis not present

## 2022-12-01 DIAGNOSIS — Z66 Do not resuscitate: Secondary | ICD-10-CM | POA: Diagnosis present

## 2022-12-01 DIAGNOSIS — Z5901 Sheltered homelessness: Secondary | ICD-10-CM | POA: Diagnosis not present

## 2022-12-01 DIAGNOSIS — I10 Essential (primary) hypertension: Secondary | ICD-10-CM | POA: Insufficient documentation

## 2022-12-01 DIAGNOSIS — Z7189 Other specified counseling: Secondary | ICD-10-CM | POA: Diagnosis not present

## 2022-12-01 DIAGNOSIS — E869 Volume depletion, unspecified: Secondary | ICD-10-CM | POA: Diagnosis present

## 2022-12-01 DIAGNOSIS — C7951 Secondary malignant neoplasm of bone: Secondary | ICD-10-CM | POA: Diagnosis present

## 2022-12-01 DIAGNOSIS — Z681 Body mass index (BMI) 19 or less, adult: Secondary | ICD-10-CM | POA: Diagnosis not present

## 2022-12-01 DIAGNOSIS — R7401 Elevation of levels of liver transaminase levels: Secondary | ICD-10-CM | POA: Insufficient documentation

## 2022-12-01 DIAGNOSIS — R6520 Severe sepsis without septic shock: Secondary | ICD-10-CM | POA: Diagnosis present

## 2022-12-01 DIAGNOSIS — Y900 Blood alcohol level of less than 20 mg/100 ml: Secondary | ICD-10-CM | POA: Diagnosis present

## 2022-12-01 DIAGNOSIS — F17213 Nicotine dependence, cigarettes, with withdrawal: Secondary | ICD-10-CM | POA: Diagnosis present

## 2022-12-01 DIAGNOSIS — Z79899 Other long term (current) drug therapy: Secondary | ICD-10-CM

## 2022-12-01 DIAGNOSIS — Z515 Encounter for palliative care: Secondary | ICD-10-CM | POA: Diagnosis not present

## 2022-12-01 DIAGNOSIS — G936 Cerebral edema: Secondary | ICD-10-CM | POA: Diagnosis present

## 2022-12-01 DIAGNOSIS — R911 Solitary pulmonary nodule: Secondary | ICD-10-CM | POA: Insufficient documentation

## 2022-12-01 DIAGNOSIS — R6521 Severe sepsis with septic shock: Secondary | ICD-10-CM | POA: Diagnosis not present

## 2022-12-01 DIAGNOSIS — E43 Unspecified severe protein-calorie malnutrition: Secondary | ICD-10-CM | POA: Diagnosis present

## 2022-12-01 DIAGNOSIS — R627 Adult failure to thrive: Secondary | ICD-10-CM | POA: Diagnosis present

## 2022-12-01 DIAGNOSIS — R64 Cachexia: Secondary | ICD-10-CM | POA: Diagnosis present

## 2022-12-01 DIAGNOSIS — E519 Thiamine deficiency, unspecified: Secondary | ICD-10-CM | POA: Insufficient documentation

## 2022-12-01 DIAGNOSIS — N400 Enlarged prostate without lower urinary tract symptoms: Secondary | ICD-10-CM | POA: Insufficient documentation

## 2022-12-01 DIAGNOSIS — N179 Acute kidney failure, unspecified: Secondary | ICD-10-CM | POA: Insufficient documentation

## 2022-12-01 DIAGNOSIS — G9341 Metabolic encephalopathy: Secondary | ICD-10-CM | POA: Diagnosis present

## 2022-12-01 DIAGNOSIS — A419 Sepsis, unspecified organism: Principal | ICD-10-CM | POA: Diagnosis present

## 2022-12-01 DIAGNOSIS — D649 Anemia, unspecified: Secondary | ICD-10-CM | POA: Insufficient documentation

## 2022-12-01 DIAGNOSIS — F1010 Alcohol abuse, uncomplicated: Secondary | ICD-10-CM | POA: Insufficient documentation

## 2022-12-01 DIAGNOSIS — M899 Disorder of bone, unspecified: Secondary | ICD-10-CM | POA: Diagnosis not present

## 2022-12-01 DIAGNOSIS — E874 Mixed disorder of acid-base balance: Secondary | ICD-10-CM | POA: Diagnosis present

## 2022-12-01 DIAGNOSIS — W19XXXA Unspecified fall, initial encounter: Secondary | ICD-10-CM | POA: Diagnosis present

## 2022-12-01 DIAGNOSIS — L8990 Pressure ulcer of unspecified site, unspecified stage: Secondary | ICD-10-CM | POA: Insufficient documentation

## 2022-12-01 DIAGNOSIS — E785 Hyperlipidemia, unspecified: Secondary | ICD-10-CM | POA: Insufficient documentation

## 2022-12-01 DIAGNOSIS — C7931 Secondary malignant neoplasm of brain: Secondary | ICD-10-CM | POA: Diagnosis present

## 2022-12-01 DIAGNOSIS — R54 Age-related physical debility: Secondary | ICD-10-CM | POA: Diagnosis present

## 2022-12-01 DIAGNOSIS — C61 Malignant neoplasm of prostate: Secondary | ICD-10-CM | POA: Diagnosis present

## 2022-12-01 DIAGNOSIS — I639 Cerebral infarction, unspecified: Secondary | ICD-10-CM

## 2022-12-01 DIAGNOSIS — E782 Mixed hyperlipidemia: Secondary | ICD-10-CM | POA: Diagnosis present

## 2022-12-01 DIAGNOSIS — F17200 Nicotine dependence, unspecified, uncomplicated: Secondary | ICD-10-CM | POA: Insufficient documentation

## 2022-12-01 DIAGNOSIS — R590 Localized enlarged lymph nodes: Secondary | ICD-10-CM | POA: Diagnosis present

## 2022-12-01 DIAGNOSIS — Y929 Unspecified place or not applicable: Secondary | ICD-10-CM | POA: Diagnosis not present

## 2022-12-01 DIAGNOSIS — L89894 Pressure ulcer of other site, stage 4: Secondary | ICD-10-CM | POA: Diagnosis present

## 2022-12-01 DIAGNOSIS — T796XXS Traumatic ischemia of muscle, sequela: Secondary | ICD-10-CM | POA: Diagnosis not present

## 2022-12-01 DIAGNOSIS — E872 Acidosis, unspecified: Secondary | ICD-10-CM | POA: Insufficient documentation

## 2022-12-01 DIAGNOSIS — C801 Malignant (primary) neoplasm, unspecified: Secondary | ICD-10-CM | POA: Diagnosis not present

## 2022-12-01 DIAGNOSIS — E876 Hypokalemia: Secondary | ICD-10-CM | POA: Insufficient documentation

## 2022-12-01 HISTORY — DX: Benign prostatic hyperplasia without lower urinary tract symptoms: N40.0

## 2022-12-01 HISTORY — DX: Tinea unguium: B35.1

## 2022-12-01 HISTORY — DX: Essential (primary) hypertension: I10

## 2022-12-01 HISTORY — DX: Mixed hyperlipidemia: E78.2

## 2022-12-01 LAB — URINALYSIS, ROUTINE W REFLEX MICROSCOPIC
Bilirubin Urine: NEGATIVE
Glucose, UA: NEGATIVE mg/dL
Ketones, ur: 20 mg/dL — AB
Nitrite: NEGATIVE
Protein, ur: NEGATIVE mg/dL
Specific Gravity, Urine: 1.016 (ref 1.005–1.030)
WBC, UA: >50 WBC/hpf (ref 0–5)
pH: 6 (ref 5.0–8.0)

## 2022-12-01 LAB — CBC WITH DIFFERENTIAL/PLATELET
Abs Immature Granulocytes: 0.06 10*3/uL (ref 0.00–0.07)
Basophils Absolute: 0 10*3/uL (ref 0.0–0.1)
Basophils Relative: 0 %
Eosinophils Absolute: 0 10*3/uL (ref 0.0–0.5)
Eosinophils Relative: 0 %
HCT: 36.1 % — ABNORMAL LOW (ref 39.0–52.0)
Hemoglobin: 10.7 g/dL — ABNORMAL LOW (ref 13.0–17.0)
Immature Granulocytes: 1 %
Lymphocytes Relative: 12 %
Lymphs Abs: 0.7 10*3/uL (ref 0.7–4.0)
MCH: 25.2 pg — ABNORMAL LOW (ref 26.0–34.0)
MCHC: 29.6 g/dL — ABNORMAL LOW (ref 30.0–36.0)
MCV: 84.9 fL (ref 80.0–100.0)
Monocytes Absolute: 0.6 10*3/uL (ref 0.1–1.0)
Monocytes Relative: 9 %
Neutro Abs: 4.6 10*3/uL (ref 1.7–7.7)
Neutrophils Relative %: 78 %
Platelets: 318 10*3/uL (ref 150–400)
RBC: 4.25 MIL/uL (ref 4.22–5.81)
RDW: 19.6 % — ABNORMAL HIGH (ref 11.5–15.5)
WBC: 6 10*3/uL (ref 4.0–10.5)
nRBC: 3.9 % — ABNORMAL HIGH (ref 0.0–0.2)

## 2022-12-01 LAB — I-STAT VENOUS BLOOD GAS, ED
Acid-base deficit: 20 mmol/L — ABNORMAL HIGH (ref 0.0–2.0)
Acid-base deficit: 8 mmol/L — ABNORMAL HIGH (ref 0.0–2.0)
Bicarbonate: 12.1 mmol/L — ABNORMAL LOW (ref 20.0–28.0)
Bicarbonate: 17.2 mmol/L — ABNORMAL LOW (ref 20.0–28.0)
Calcium, Ion: <0.3 mmol/L — CL (ref 1.15–1.40)
Calcium, Ion: 1.06 mmol/L — ABNORMAL LOW (ref 1.15–1.40)
HCT: 28 % — ABNORMAL LOW (ref 39.0–52.0)
HCT: 30 % — ABNORMAL LOW (ref 39.0–52.0)
Hemoglobin: 9.5 g/dL — ABNORMAL LOW (ref 13.0–17.0)
Hemoglobin: 10.2 g/dL — ABNORMAL LOW (ref 13.0–17.0)
O2 Saturation: 39 %
O2 Saturation: 83 %
Potassium: 2.6 mmol/L — CL (ref 3.5–5.1)
Potassium: 3.5 mmol/L (ref 3.5–5.1)
Sodium: 146 mmol/L — ABNORMAL HIGH (ref 135–145)
Sodium: 144 mmol/L (ref 135–145)
TCO2: 14 mmol/L — ABNORMAL LOW (ref 22–32)
TCO2: 18 mmol/L — ABNORMAL LOW (ref 22–32)
pCO2, Ven: 60.1 mmHg — ABNORMAL HIGH (ref 44–60)
pCO2, Ven: 34.4 mmHg — ABNORMAL LOW (ref 44–60)
pH, Ven: 6.912 — CL (ref 7.25–7.43)
pH, Ven: 7.307 (ref 7.25–7.43)
pO2, Ven: 37 mmHg (ref 32–45)
pO2, Ven: 52 mmHg — ABNORMAL HIGH (ref 32–45)

## 2022-12-01 LAB — COMPREHENSIVE METABOLIC PANEL
ALT: 63 U/L — ABNORMAL HIGH (ref 0–44)
ALT: 61 U/L — ABNORMAL HIGH (ref 0–44)
AST: 165 U/L — ABNORMAL HIGH (ref 15–41)
AST: 134 U/L — ABNORMAL HIGH (ref 15–41)
Albumin: 2.3 g/dL — ABNORMAL LOW (ref 3.5–5.0)
Albumin: 2.2 g/dL — ABNORMAL LOW (ref 3.5–5.0)
Alkaline Phosphatase: 395 U/L — ABNORMAL HIGH (ref 38–126)
Alkaline Phosphatase: 343 U/L — ABNORMAL HIGH (ref 38–126)
Anion gap: 17 — ABNORMAL HIGH (ref 5–15)
Anion gap: 15 (ref 5–15)
BUN: 55 mg/dL — ABNORMAL HIGH (ref 8–23)
BUN: 47 mg/dL — ABNORMAL HIGH (ref 8–23)
CO2: 14 mmol/L — ABNORMAL LOW (ref 22–32)
CO2: 20 mmol/L — ABNORMAL LOW (ref 22–32)
Calcium: 8.4 mg/dL — ABNORMAL LOW (ref 8.9–10.3)
Calcium: 8.4 mg/dL — ABNORMAL LOW (ref 8.9–10.3)
Chloride: 112 mmol/L — ABNORMAL HIGH (ref 98–111)
Chloride: 109 mmol/L (ref 98–111)
Creatinine, Ser: 1.37 mg/dL — ABNORMAL HIGH (ref 0.61–1.24)
Creatinine, Ser: 1.05 mg/dL (ref 0.61–1.24)
GFR, Estimated: 55 mL/min — ABNORMAL LOW (ref >60)
GFR, Estimated: >60 mL/min (ref >60)
Glucose, Bld: 99 mg/dL (ref 70–99)
Glucose, Bld: 124 mg/dL — ABNORMAL HIGH (ref 70–99)
Potassium: 3.9 mmol/L (ref 3.5–5.1)
Potassium: 3.7 mmol/L (ref 3.5–5.1)
Sodium: 143 mmol/L (ref 135–145)
Sodium: 144 mmol/L (ref 135–145)
Total Bilirubin: 0.8 mg/dL (ref 0.3–1.2)
Total Bilirubin: 1 mg/dL (ref 0.3–1.2)
Total Protein: 6.1 g/dL — ABNORMAL LOW (ref 6.5–8.1)
Total Protein: 5.8 g/dL — ABNORMAL LOW (ref 6.5–8.1)

## 2022-12-01 LAB — MAGNESIUM: Magnesium: 2.3 mg/dL (ref 1.7–2.4)

## 2022-12-01 LAB — CK
Total CK: 6035 U/L — ABNORMAL HIGH (ref 49–397)
Total CK: 4548 U/L — ABNORMAL HIGH (ref 49–397)

## 2022-12-01 LAB — RAPID URINE DRUG SCREEN, HOSP PERFORMED
Amphetamines: NOT DETECTED
Barbiturates: NOT DETECTED
Benzodiazepines: NOT DETECTED
Cocaine: NOT DETECTED
Opiates: NOT DETECTED
Tetrahydrocannabinol: NOT DETECTED

## 2022-12-01 LAB — PHOSPHORUS: Phosphorus: 2.6 mg/dL (ref 2.5–4.6)

## 2022-12-01 LAB — HEPATITIS PANEL, ACUTE
HCV Ab: NONREACTIVE
Hep A IgM: NONREACTIVE
Hep B C IgM: NONREACTIVE
Hepatitis B Surface Ag: NONREACTIVE

## 2022-12-01 LAB — PROTIME-INR
INR: 1.3 — ABNORMAL HIGH (ref 0.8–1.2)
Prothrombin Time: 16 seconds — ABNORMAL HIGH (ref 11.4–15.2)

## 2022-12-01 LAB — I-STAT CG4 LACTIC ACID, ED
Lactic Acid, Venous: 2.7 mmol/L (ref 0.5–1.9)
Lactic Acid, Venous: 3.5 mmol/L (ref 0.5–1.9)

## 2022-12-01 LAB — PSA: Prostatic Specific Antigen: >1500 ng/mL — ABNORMAL HIGH (ref 0.00–4.00)

## 2022-12-01 LAB — VITAMIN B12: Vitamin B-12: 772 pg/mL (ref 180–914)

## 2022-12-01 LAB — HIV ANTIBODY (ROUTINE TESTING W REFLEX): HIV Screen 4th Generation wRfx: NONREACTIVE

## 2022-12-01 LAB — TSH: TSH: 2.385 u[IU]/mL (ref 0.350–4.500)

## 2022-12-01 LAB — ETHANOL: Alcohol, Ethyl (B): <10 mg/dL (ref <10)

## 2022-12-01 LAB — CBG MONITORING, ED: Glucose-Capillary: 75 mg/dL (ref 70–99)

## 2022-12-01 LAB — APTT: aPTT: 24 seconds (ref 24–36)

## 2022-12-01 LAB — URIC ACID: Uric Acid, Serum: 13.9 mg/dL — ABNORMAL HIGH (ref 3.7–8.6)

## 2022-12-01 LAB — LACTIC ACID, PLASMA
Lactic Acid, Venous: 1.8 mmol/L (ref 0.5–1.9)
Lactic Acid, Venous: 1.7 mmol/L (ref 0.5–1.9)

## 2022-12-01 LAB — AMMONIA: Ammonia: 17 umol/L (ref 9–35)

## 2022-12-01 LAB — LACTATE DEHYDROGENASE: LDH: 1019 U/L — ABNORMAL HIGH (ref 98–192)

## 2022-12-01 MED ORDER — VANCOMYCIN HCL IN DEXTROSE 1-5 GM/200ML-% IV SOLN
1000.0000 mg | Freq: Once | INTRAVENOUS | Status: AC
  Administered 2022-12-01: 1000 mg via INTRAVENOUS
  Filled 2022-12-01: qty 200

## 2022-12-01 MED ORDER — LACTATED RINGERS IV BOLUS (SEPSIS)
500.0000 mL | Freq: Once | INTRAVENOUS | Status: AC
  Administered 2022-12-01: 500 mL via INTRAVENOUS

## 2022-12-01 MED ORDER — ASPIRIN 81 MG PO TBEC
81.0000 mg | DELAYED_RELEASE_TABLET | Freq: Every day | ORAL | Status: DC
  Administered 2022-12-01 – 2022-12-02 (×2): 81 mg via ORAL
  Filled 2022-12-01 (×2): qty 1

## 2022-12-01 MED ORDER — THIAMINE HCL 100 MG/ML IJ SOLN
100.0000 mg | Freq: Every day | INTRAMUSCULAR | Status: DC
  Filled 2022-12-01 (×2): qty 2

## 2022-12-01 MED ORDER — ADULT MULTIVITAMIN W/MINERALS CH
1.0000 | ORAL_TABLET | Freq: Every day | ORAL | Status: DC
  Administered 2022-12-01 – 2022-12-02 (×2): 1 via ORAL
  Filled 2022-12-01 (×2): qty 1

## 2022-12-01 MED ORDER — THIAMINE MONONITRATE 100 MG PO TABS
100.0000 mg | ORAL_TABLET | Freq: Every day | ORAL | Status: DC
  Administered 2022-12-01 – 2022-12-02 (×2): 100 mg via ORAL
  Filled 2022-12-01 (×2): qty 1

## 2022-12-01 MED ORDER — GADOBUTROL 1 MMOL/ML IV SOLN
5.0000 mL | Freq: Once | INTRAVENOUS | Status: AC | PRN
  Administered 2022-12-01: 5 mL via INTRAVENOUS

## 2022-12-01 MED ORDER — SODIUM CHLORIDE 0.9 % IV SOLN
2.0000 g | INTRAVENOUS | Status: DC
  Administered 2022-12-02: 2 g via INTRAVENOUS
  Filled 2022-12-01: qty 12.5

## 2022-12-01 MED ORDER — METRONIDAZOLE 500 MG/100ML IV SOLN
500.0000 mg | Freq: Two times a day (BID) | INTRAVENOUS | Status: DC
  Administered 2022-12-02 (×2): 500 mg via INTRAVENOUS
  Filled 2022-12-01 (×3): qty 100

## 2022-12-01 MED ORDER — CALCIUM CARBONATE 1250 (500 CA) MG PO TABS
1.0000 | ORAL_TABLET | Freq: Three times a day (TID) | ORAL | Status: DC
  Administered 2022-12-01 – 2022-12-02 (×3): 1250 mg via ORAL
  Filled 2022-12-01 (×5): qty 1

## 2022-12-01 MED ORDER — LORAZEPAM 1 MG PO TABS: 1.0000 mg | ORAL_TABLET | ORAL | Status: DC | PRN

## 2022-12-01 MED ORDER — TAMSULOSIN HCL 0.4 MG PO CAPS
0.4000 mg | ORAL_CAPSULE | Freq: Every day | ORAL | Status: DC
  Administered 2022-12-01 – 2022-12-02 (×2): 0.4 mg via ORAL
  Filled 2022-12-01 (×2): qty 1

## 2022-12-01 MED ORDER — LACTATED RINGERS IV SOLN: INTRAVENOUS | Status: DC

## 2022-12-01 MED ORDER — FOLIC ACID 1 MG PO TABS
1.0000 mg | ORAL_TABLET | Freq: Every day | ORAL | Status: DC
  Administered 2022-12-01 – 2022-12-02 (×2): 1 mg via ORAL
  Filled 2022-12-01 (×2): qty 1

## 2022-12-01 MED ORDER — ENOXAPARIN SODIUM 40 MG/0.4ML IJ SOSY
40.0000 mg | PREFILLED_SYRINGE | INTRAMUSCULAR | Status: DC
  Administered 2022-12-01 – 2022-12-02 (×2): 40 mg via SUBCUTANEOUS
  Filled 2022-12-01 (×2): qty 0.4

## 2022-12-01 MED ORDER — LACTATED RINGERS IV BOLUS
1000.0000 mL | Freq: Once | INTRAVENOUS | Status: AC
  Administered 2022-12-01: 1000 mL via INTRAVENOUS

## 2022-12-01 MED ORDER — POTASSIUM CHLORIDE 20 MEQ PO PACK
40.0000 meq | PACK | Freq: Once | ORAL | Status: AC
  Administered 2022-12-01: 40 meq via ORAL
  Filled 2022-12-01: qty 2

## 2022-12-01 MED ORDER — NICOTINE 14 MG/24HR TD PT24
14.0000 mg | MEDICATED_PATCH | Freq: Every day | TRANSDERMAL | Status: DC
  Filled 2022-12-01: qty 1

## 2022-12-01 MED ORDER — SODIUM CHLORIDE 0.9 % IV SOLN
2.0000 g | Freq: Once | INTRAVENOUS | Status: AC
  Administered 2022-12-01: 2 g via INTRAVENOUS
  Filled 2022-12-01: qty 12.5

## 2022-12-01 MED ORDER — SODIUM CHLORIDE 0.9 % IV BOLUS
2500.0000 mL | Freq: Once | INTRAVENOUS | Status: AC
  Administered 2022-12-01: 2500 mL via INTRAVENOUS

## 2022-12-01 MED ORDER — METRONIDAZOLE 500 MG/100ML IV SOLN
500.0000 mg | Freq: Once | INTRAVENOUS | Status: AC
  Administered 2022-12-01: 500 mg via INTRAVENOUS
  Filled 2022-12-01: qty 100

## 2022-12-01 NOTE — Assessment & Plan Note (Signed)
  Severe sepsis Unknown source, of note the patient does have a transaminitis and elevated alk phos so covering with cefepime Flagyl for intra-abdominal source also potential soft tissue source although the patient does not have any purulent wounds or concern for MRSA at this time.  Patient presented with systolic blood pressure 79 now up to 102 temperature 35.8 C and elevated lactate along with tachycardia lactate increased from 2.7-3.5. Plan will be to continue cefepime and Flagyl follow lactate the patient has already received 30 mL/kg crystalloids we will continue maintenance fluids, follow outstanding blood culture and urinalysis results, chest x-ray was clear

## 2022-12-01 NOTE — Progress Notes (Signed)
Pharmacy Antibiotic Note  Robert Molina is a 72 y.o. male admitted on 12/01/2022 presenting with AMS, concern for sepsis.  Pharmacy has been consulted for cefepime dosing.  Given cefepime 2g IV x 1 and vancomycin x1 in ED  Plan: Cefepime 2g IV every 24h Monitor renal function, Cx and clinical progression to narrow  Height: 5\' 11"  (180.3 cm) Weight: 52.2 kg (115 lb) IBW/kg (Calculated) : 75.3  Temp (24hrs), Avg:96.9 F (36.1 C), Min:96.4 F (35.8 C), Max:97.4 F (36.3 C)  Recent Labs  Lab 12/01/22 1027 12/01/22 1045 12/01/22 1050 12/01/22 1308  WBC 6.0  --   --   --   CREATININE  --  1.37*  --   --   LATICACIDVEN  --   --  2.7* 3.5*    Estimated Creatinine Clearance: 37 mL/min (A) (by C-G formula based on SCr of 1.37 mg/dL (H)).    No Known Allergies  Daylene Posey, PharmD, [facility] Clinical Pharmacist ED Pharmacist Phone # [phone] 12/01/2022 2:18 PM

## 2022-12-01 NOTE — Assessment & Plan Note (Signed)
  Benign prostatic hyperplasia Continue patient's home tamsulosin and bladder scans PSA pending , Bladder Scans

## 2022-12-01 NOTE — ED Triage Notes (Signed)
Per EMS, pt was found on bathroom floor, covered in urine and feces, unknown for how long patient was on the floor Pt initially was not A/O, on arrival to ED patient is alert to self, able to tell us his name  Skin breakdown noted by EMS  EMS gave 100 mL of NS

## 2022-12-01 NOTE — Assessment & Plan Note (Signed)
Hypokalemia Potassium 2.6 on admission now up to 3.5 Provides further potassium supplementation check magnesium and phosphate

## 2022-12-01 NOTE — Assessment & Plan Note (Signed)
Metabolic acidosis Initial pH 6.91 now increased to 7.3 with a bicarb 12 now up to 17 This is all likely secondary to sepsis and volume depletion, we will follow serially No evidence of respiratory failure

## 2022-12-01 NOTE — Assessment & Plan Note (Signed)
Pressure wounds present on admission Patient has left pectoral pressure wounds consulting wound care

## 2022-12-01 NOTE — Sepsis Progress Note (Signed)
Elink following code sepsis °

## 2022-12-01 NOTE — Assessment & Plan Note (Addendum)
Suspected thiamine deficiency Secondary to ethanol abuse Continue prophylactic thiamine repletion

## 2022-12-01 NOTE — Assessment & Plan Note (Signed)
Ethanol abuse CIWA protocol with PRN Benzodiazepines

## 2022-12-01 NOTE — Assessment & Plan Note (Signed)
  Nicotine dependence with withdrawal Patient has withdrawal symptoms off of nicotine is a long-term smoker will start nicotine patch

## 2022-12-01 NOTE — Assessment & Plan Note (Signed)
Hypertension Given the patient's hypotension we are holding home amlodipine Coreg and hydrochlorothiazide Unsure the patient's compliance to these medications as per the cousin at the bedside

## 2022-12-01 NOTE — Plan of Care (Signed)

## 2022-12-01 NOTE — Assessment & Plan Note (Signed)
  Anemia Hemoglobin 10.7 without any signs of ongoing bleeding We will continue to monitor

## 2022-12-01 NOTE — Assessment & Plan Note (Signed)
  Concern for cerebrovascular accident Patient cannot lift left upper extremity on examination which is new, nearly no power against gravity Given the patient has been in this state for greater than 3 days will not be a code stroke candidate, CT of note was unremarkable Will obtain an MRI with and without contrast as stat to exclude cerebrovascular accident or CNS metastases accounting for the symptoms Starting aspirin, holding off statin given the patient's rhabdomyolysis, will reevaluate once MRI results are available Will obtain x-ray of the left shoulder
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
## 2022-12-01 NOTE — ED Notes (Signed)
X-ray at bedside

## 2022-12-01 NOTE — ED Provider Notes (Signed)
[place] [facility]MENT AT [facility] Provider Note   CSN: [id number] Arrival date & time: 12/01/22  9629     History  Chief Complaint  Patient presents with   Altered Mental Status    Robert Molina is a 72 y.o. male.  Patient is a 72 year old male with a past medical history of hypertension and alcohol use disorder presenting to the emergency department with altered mental status.  The patient lives at a hotel and hotel staff called for a wellness check this morning.  He was found unresponsive in his bathroom.  Unsure how long he he had been on the floor for the patient does not remember falling.  He was found covered in urine and feces and was given gentle fluids and route by EMS.  Patient denies any recent alcohol use.  He reports he does not remember the fall and does not know how he got into the ground or when this occurred.  Further history is limited due to patient's mental status.  The history is provided by the patient and the EMS personnel. The history is limited by the condition of the patient.  Altered Mental Status      Home Medications Prior to Admission medications   Medication Sig Start Date End Date Taking? Authorizing Provider  amLODipine (NORVASC) 10 MG tablet TAKE 1 TABLET (10 MG TOTAL) BY MOUTH DAILY. 12/03/21   Grayce Sessions, NP  carvedilol (COREG) 3.125 MG tablet Take 1 tablet (3.125 mg total) by mouth 2 (two) times daily. 12/03/21   Grayce Sessions, NP  hydrochlorothiazide (HYDRODIURIL) 25 MG tablet Take 1 tablet (25 mg total) by mouth daily. 05/16/21   Grayce Sessions, NP  pravastatin (PRAVACHOL) 20 MG tablet TAKE 1 TABLET (20 MG TOTAL) BY MOUTH DAILY. 12/03/21   Grayce Sessions, NP  tamsulosin (FLOMAX) 0.4 MG CAPS capsule Take 1 capsule (0.4 mg total) by mouth daily. 05/16/21   Grayce Sessions, NP      Allergies    Patient has no known allergies.    Review of Systems   Review of Systems  Physical Exam Updated  Vital Signs BP 102/69   Pulse 94   Temp (!) 96.4 F (35.8 C)   Resp 12   Ht 5\' 11"  (1.803 m)   Wt 52.2 kg   SpO2 95%   BMI 16.04 kg/m  Physical Exam Vitals and nursing note reviewed.  Constitutional:      Comments: Frail and cachectic appearing  HENT:     Head: Normocephalic and atraumatic.     Nose: Nose normal.     Mouth/Throat:     Mouth: Mucous membranes are moist.     Pharynx: Oropharynx is clear.  Eyes:     Extraocular Movements: Extraocular movements intact.     Conjunctiva/sclera: Conjunctivae normal.     Pupils: Pupils are equal, round, and reactive to light.  Neck:     Comments: No midline neck tenderness Cardiovascular:     Rate and Rhythm: Normal rate and regular rhythm.     Heart sounds: Normal heart sounds.  Pulmonary:     Effort: Pulmonary effort is normal.     Breath sounds: Normal breath sounds.  Abdominal:     General: Abdomen is flat.     Palpations: Abdomen is soft.     Tenderness: There is no abdominal tenderness.  Musculoskeletal:        General: Normal range of motion.     Cervical back:  Normal range of motion and neck supple.     Comments: No bony tenderness of bilateral upper or lower extremities  Skin:    General: Skin is warm and dry.     Findings: Bruising (Left chest wall) present.     Comments: Areas of skin breakdown consistent with pressure wounds along left chest wall, left hip and right knee  Neurological:     General: No focal deficit present.     Mental Status: He is alert.     Comments: Oriented to person only  Psychiatric:        Mood and Affect: Mood normal.        Behavior: Behavior normal.     ED Results / Procedures / Treatments   Labs (all labs ordered are listed, but only abnormal results are displayed) Labs Reviewed  COMPREHENSIVE METABOLIC PANEL - Abnormal; Notable for the following components:      Result Value   Chloride 112 (*)    CO2 14 (*)    BUN 55 (*)    Creatinine, Ser 1.37 (*)    Calcium 8.4 (*)     Total Protein 6.1 (*)    Albumin 2.3 (*)    AST 165 (*)    ALT 63 (*)    Alkaline Phosphatase 395 (*)    GFR, Estimated 55 (*)    Anion gap 17 (*)    All other components within normal limits  CK - Abnormal; Notable for the following components:   Total CK 6,035 (*)    All other components within normal limits  CBC WITH DIFFERENTIAL/PLATELET - Abnormal; Notable for the following components:   Hemoglobin 10.7 (*)    HCT 36.1 (*)    MCH 25.2 (*)    MCHC 29.6 (*)    RDW 19.6 (*)    nRBC 3.9 (*)    All other components within normal limits  I-STAT VENOUS BLOOD GAS, ED - Abnormal; Notable for the following components:   pH, Ven 6.912 (*)    pCO2, Ven 60.1 (*)    Bicarbonate 12.1 (*)    TCO2 14 (*)    Acid-base deficit 20.0 (*)    Sodium 146 (*)    Potassium 2.6 (*)    Calcium, Ion <0.30 (*)    HCT 28.0 (*)    Hemoglobin 9.5 (*)    All other components within normal limits  I-STAT CG4 LACTIC ACID, ED - Abnormal; Notable for the following components:   Lactic Acid, Venous 2.7 (*)    All other components within normal limits  I-STAT CG4 LACTIC ACID, ED - Abnormal; Notable for the following components:   Lactic Acid, Venous 3.5 (*)    All other components within normal limits  I-STAT VENOUS BLOOD GAS, ED - Abnormal; Notable for the following components:   pCO2, Ven 34.4 (*)    pO2, Ven 52 (*)    Bicarbonate 17.2 (*)    TCO2 18 (*)    Acid-base deficit 8.0 (*)    Calcium, Ion 1.06 (*)    HCT 30.0 (*)    Hemoglobin 10.2 (*)    All other components within normal limits  CULTURE, BLOOD (ROUTINE X 2)  CULTURE, BLOOD (ROUTINE X 2)  ETHANOL  CBC WITH DIFFERENTIAL/PLATELET  URINALYSIS, ROUTINE W REFLEX MICROSCOPIC  RAPID URINE DRUG SCREEN, HOSP PERFORMED  PROTIME-INR  APTT  CBG MONITORING, ED    EKG EKG Interpretation Date/Time:  Saturday December 01 2022 10:05:33 EDT Ventricular Rate:  111 PR  Interval:  158 QRS Duration:  87 QT Interval:  344 QTC Calculation: 470 R  Axis:   82  Text Interpretation: Sinus tachycardia Borderline right axis deviation No significant change since last tracing Confirmed by Elayne Snare ([id number]) on 12/01/2022 10:19:12 AM  Radiology DG Pelvis Portable  Result Date: 12/01/2022 CLINICAL DATA:  Fall.  Patient found down on bathroom floor. EXAM: PORTABLE PELVIS 1-2 VIEWS COMPARISON:  None Available. FINDINGS: Diffuse sclerotic changes are present throughout the axial skeleton. No acute fractures are present. Soft tissues are unremarkable. IMPRESSION: 1. No acute abnormality. 2. Diffuse sclerotic changes throughout the axial skeleton compatible with metastatic disease. Electronically Signed   By: Marin Roberts M.D.   On: 12/01/2022 12:14   DG Chest Port 1 View  Result Date: 12/01/2022 CLINICAL DATA:  Fall. Trauma. Patient was found down on the bathroom floor. EXAM: PORTABLE CHEST 1 VIEW COMPARISON:  One-view chest x-ray 08/07/2019 FINDINGS: The heart size is normal. Changes of COPD are present. The lungs are clear. No edema or effusion is present. The visualized soft tissues and bony thorax are unremarkable. No acute fractures are present. Diffuse sclerotic changes are present throughout the axial skeleton, new since the prior exam. IMPRESSION: 1. No acute cardiopulmonary disease. 2. Diffuse sclerotic changes throughout the axial skeleton, new since the prior exam. Findings are consistent with metastatic disease or myeloma. Electronically Signed   By: Marin Roberts M.D.   On: 12/01/2022 12:12   CT Head Wo Contrast  Result Date: 12/01/2022 CLINICAL DATA:  Provided history: Head trauma, minor. Neck trauma. Patient found down on bathroom floor, skin breakdown noted by EMS. EXAM: CT HEAD WITHOUT CONTRAST CT CERVICAL SPINE WITHOUT CONTRAST TECHNIQUE: Multidetector CT imaging of the head and cervical spine was performed following the standard protocol without intravenous contrast. Multiplanar CT image reconstructions of the cervical  spine were also generated. RADIATION DOSE REDUCTION: This exam was performed according to the departmental dose-optimization program which includes automated exposure control, adjustment of the mA and/or kV according to patient size and/or use of iterative reconstruction technique. COMPARISON:  Head CT 11/13/2019.  Neck CT 04/10/2013. FINDINGS: CT HEAD FINDING Brain: Generalized cerebral atrophy. There is no acute intracranial hemorrhage. No demarcated cortical infarct. No extra-axial fluid collection. No evidence of an intracranial mass. No midline shift. Vascular: No hyperdense vessel. Skull: Portions of the imaged calvarium, skull base, mandible and maxillofacial structures have an irregular and sclerotic appearance. No calvarial fracture. Sinuses/Orbits: No mass or acute finding within the imaged orbits. Mucosal thickening within the bilateral frontal sinuses (mild right, mild to moderate left). Mild mucosal thickening within the bilateral sphenoid sinuses. CT CERVICAL SPINE FINDINGS Alignment: Levocurvature of the cervical spine. Nonspecific straightening of the expected cervical lordosis. Mild C5-C6 grade 1 retrolisthesis. Skull base and vertebrae: The basion-dental and atlanto-dental intervals are maintained.No evidence of acute fracture to the cervical spine. Diffuse irregular sclerotic appearance of the cervical and visualized thoracic spine, of portions of the bilateral clavicles and scapulae, and of bilateral ribs. Soft tissues and spinal canal: No prevertebral fluid or swelling. No visible canal hematoma. Disc levels: Cervical spondylosis with multilevel disc space narrowing, disc bulges/central disc protrusions and facet arthrosis. At C5-C6, there is advanced disc degeneration with a posterior disc osteophyte complex and left-sided uncovertebral hypertrophy. No appreciable high-grade spinal canal stenosis. Multilevel bony neural foraminal narrowing. Upper chest: No visible pneumothorax. Emphysema. 4 mm  right upper lobe pulmonary nodule, new from the prior neck CT of 04/10/2013 (series 5, image 107). A 4  mm nodule more anteriorly within the right lung apex has not significantly changed in size as compared to the prior neck CT and is considered benign (series 5, image 111). IMPRESSION: CT head: 1.  No evidence of an acute intracranial abnormality. 2. Generalized cerebral atrophy. 3. Portions of the imaged calvarium, skull base, mandible and maxillofacial structures have an irregular and sclerotic appearance. This likely reflects sequelae of widespread osseous metastatic disease (does the patient have a history of prostate cancer?) 4. Paranasal sinus disease as described. CT cervical spine: 1. No evidence of an acute cervical spine fracture. 2. Levocurvature of the cervical spine. 3. Nonspecific straightening of the expected cervical lordosis. 4. Mild C5-C6 grade 1 retrolisthesis. 5. Cervical spondylosis as described. 6. Diffuse irregular sclerotic appearance of the cervical and visualized thoracic spine, of portions of the bilateral clavicles and scapulae, and of bilateral ribs. This likely reflects sequelae of widespread osseous metastatic disease (does the patient have a history of prostate cancer?) 7. 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 8. Emphysema (ICD10-J43.9). Electronically Signed   By: Jackey Loge D.O.   On: 12/01/2022 11:33   CT Cervical Spine Wo Contrast  Result Date: 12/01/2022 CLINICAL DATA:  Provided history: Head trauma, minor. Neck trauma. Patient found down on bathroom floor, skin breakdown noted by EMS. EXAM: CT HEAD WITHOUT CONTRAST CT CERVICAL SPINE WITHOUT CONTRAST TECHNIQUE: Multidetector CT imaging of the head and cervical spine was performed following the standard protocol without intravenous contrast. Multiplanar CT image reconstructions of the cervical spine were also  generated. RADIATION DOSE REDUCTION: This exam was performed according to the departmental dose-optimization program which includes automated exposure control, adjustment of the mA and/or kV according to patient size and/or use of iterative reconstruction technique. COMPARISON:  Head CT 11/13/2019.  Neck CT 04/10/2013. FINDINGS: CT HEAD FINDING Brain: Generalized cerebral atrophy. There is no acute intracranial hemorrhage. No demarcated cortical infarct. No extra-axial fluid collection. No evidence of an intracranial mass. No midline shift. Vascular: No hyperdense vessel. Skull: Portions of the imaged calvarium, skull base, mandible and maxillofacial structures have an irregular and sclerotic appearance. No calvarial fracture. Sinuses/Orbits: No mass or acute finding within the imaged orbits. Mucosal thickening within the bilateral frontal sinuses (mild right, mild to moderate left). Mild mucosal thickening within the bilateral sphenoid sinuses. CT CERVICAL SPINE FINDINGS Alignment: Levocurvature of the cervical spine. Nonspecific straightening of the expected cervical lordosis. Mild C5-C6 grade 1 retrolisthesis. Skull base and vertebrae: The basion-dental and atlanto-dental intervals are maintained.No evidence of acute fracture to the cervical spine. Diffuse irregular sclerotic appearance of the cervical and visualized thoracic spine, of portions of the bilateral clavicles and scapulae, and of bilateral ribs. Soft tissues and spinal canal: No prevertebral fluid or swelling. No visible canal hematoma. Disc levels: Cervical spondylosis with multilevel disc space narrowing, disc bulges/central disc protrusions and facet arthrosis. At C5-C6, there is advanced disc degeneration with a posterior disc osteophyte complex and left-sided uncovertebral hypertrophy. No appreciable high-grade spinal canal stenosis. Multilevel bony neural foraminal narrowing. Upper chest: No visible pneumothorax. Emphysema. 4 mm right upper lobe  pulmonary nodule, new from the prior neck CT of 04/10/2013 (series 5, image 107). A 4 mm nodule more anteriorly within the right lung apex has not significantly changed in size as compared to the prior neck CT and is considered benign (series 5, image 111). IMPRESSION: CT head: 1.  No evidence of  an acute intracranial abnormality. 2. Generalized cerebral atrophy. 3. Portions of the imaged calvarium, skull base, mandible and maxillofacial structures have an irregular and sclerotic appearance. This likely reflects sequelae of widespread osseous metastatic disease (does the patient have a history of prostate cancer?) 4. Paranasal sinus disease as described. CT cervical spine: 1. No evidence of an acute cervical spine fracture. 2. Levocurvature of the cervical spine. 3. Nonspecific straightening of the expected cervical lordosis. 4. Mild C5-C6 grade 1 retrolisthesis. 5. Cervical spondylosis as described. 6. Diffuse irregular sclerotic appearance of the cervical and visualized thoracic spine, of portions of the bilateral clavicles and scapulae, and of bilateral ribs. This likely reflects sequelae of widespread osseous metastatic disease (does the patient have a history of prostate cancer?) 7. 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 8. Emphysema (ICD10-J43.9). Electronically Signed   By: Jackey Loge D.O.   On: 12/01/2022 11:33    Procedures .Critical Care  Performed by: Rexford Maus, DO Authorized by: Rexford Maus, DO   Critical care provider statement:    Critical care time (minutes):  40   Critical care time was exclusive of:  Separately billable procedures and treating other patients   Critical care was necessary to treat or prevent imminent or life-threatening deterioration of the following conditions:  Metabolic crisis and dehydration   Critical care was time spent personally by  me on the following activities:  Development of treatment plan with patient or surrogate, discussions with consultants, evaluation of patient's response to treatment, examination of patient, obtaining history from patient or surrogate, ordering and performing treatments and interventions, ordering and review of laboratory studies, ordering and review of radiographic studies, pulse oximetry, re-evaluation of patient's condition and review of old charts   I assumed direction of critical care for this patient from another provider in my specialty: no     Care discussed with: admitting provider       Medications Ordered in ED Medications  lactated ringers infusion ( Intravenous New Bag/Given 12/01/22 1219)  vancomycin (VANCOCIN) IVPB 1000 mg/200 mL premix (1,000 mg Intravenous New Bag/Given 12/01/22 1241)  lactated ringers bolus 1,000 mL (has no administration in time range)  lactated ringers bolus 1,000 mL (0 mLs Intravenous Stopped 12/01/22 1218)  lactated ringers bolus 500 mL (500 mLs Intravenous New Bag/Given 12/01/22 1220)  ceFEPIme (MAXIPIME) 2 g in sodium chloride 0.9 % 100 mL IVPB (0 g Intravenous Stopped 12/01/22 1218)  metroNIDAZOLE (FLAGYL) IVPB 500 mg (0 mg Intravenous Stopped 12/01/22 1246)    ED Course/ Medical Decision Making/ A&P Clinical Course as of 12/01/22 1322  Sat Dec 01, 2022  1111 Patient with combined metabolic and respiratory acidosis. Is awake and alert with no respiratory distress and does not need BiPAP at this time. Does have an elevated lactic and will be treated for sepsis. Initial low BP normalized on repeat. [VK]  1213 Patient's mental status is improving, now oriented to place and month, not year. CK elevated with mild AKI consistent with rhabdo. Will have repeat VBG to determine level of care for disposition. [VK]  1320 Lactate uptrending, however VBG significant improved. Will be given additional fluid bolus. Will be admitted to hospitalist. [VK]    Clinical Course  User Index [VK] Rexford Maus, DO  Medical Decision Making This patient presents to the ED with chief complaint(s) of fall, altered mental status with pertinent past medical history of hypertension, alcohol use disorder which further complicates the presenting complaint. The complaint involves an extensive differential diagnosis and also carries with it a high risk of complications and morbidity.    The differential diagnosis includes ICH, mass effect, dehydration, electrolyte abnormality, hypo or hyperglycemia, intoxication, rhabdo, infection  Additional history obtained: Additional history obtained from EMS  Records reviewed Primary Care Documents  ED Course and Reassessment: On patient's arrival to the emergency department he was mildly tachycardic and mildly hypothermic.  He is cachectic and frail-appearing oriented only to self but is following commands in all 4 extremities.  The patient had EKG on arrival that showed sinus tachycardia without acute ischemic changes.  The patient will have labs including CK, VBG and lactate will have head CT and C-spine to evaluate for traumatic injury and will be assessed for possible infection or intoxication.  He will be started on IV fluids and will be closely reassessed.  Independent labs interpretation:  The following labs were independently interpreted: combined metabolic and respiratory acidosis, rhabdo with AKI  Independent visualization of imaging: - I independently visualized the following imaging with scope of interpretation limited to determining acute life threatening conditions related to emergency care: CTH/C-spine, CXR, pelvis XR, which revealed no acute traumatic injury, sclerotic bony lesions concerning for metastasis  Consultation: - Consulted or discussed management/test interpretation w/ external professional: hospitalist  Consideration for admission or further workup: patient requires  admission for rhabdo with acidosis Social Determinants of health: N/A    Amount and/or Complexity of Data Reviewed Labs: ordered. Radiology: ordered.  Risk Prescription drug management. Decision regarding hospitalization.          Final Clinical Impression(s) / ED Diagnoses Final diagnoses:  Traumatic rhabdomyolysis, initial encounter (HCC)  AKI (acute kidney injury) (HCC)  Lytic lesion of bone on x-ray  Acidosis, metabolic, with respiratory acidosis    Rx / DC Orders ED Discharge Orders     None         Rexford Maus, DO 12/01/22 1322

## 2022-12-01 NOTE — Assessment & Plan Note (Signed)
  Right upper lobe nodule of the lung Interval follow-up with both pulmonology Notable in setting of possible Mets No active pulm symptoms

## 2022-12-01 NOTE — Progress Notes (Signed)
ED Pharmacy Antibiotic Sign Off An antibiotic consult was received from an ED provider for vancomycin and cefepime per pharmacy dosing for sepsis. A chart review was completed to assess appropriateness.   The following one time order(s) were placed:  Vancomycin 1g IV x 1 Cefepime 2g IV x 1  Further antibiotic and/or antibiotic pharmacy consults should be ordered by the admitting provider if indicated.   Thank you for allowing pharmacy to be a part of this patient's care.   Daylene Posey, [facility]  Clinical Pharmacist 12/01/22 11:12 AM

## 2022-12-01 NOTE — Assessment & Plan Note (Addendum)
Acute kidney injury Creatinine 1.37, likely secondary to volume depletion possibly sepsis in the patient's traumatic rhabdomyolysis Will continue crystalloid, obtain ultrasound of the retroperitoneum to rule out hydronephrosis given his concern for malignancy, bladder scans, serial BMP

## 2022-12-01 NOTE — Assessment & Plan Note (Signed)
Traumatic rhabdomyolysis Patient presents with CPK of 6035 on clinical exam no evidence of compartment syndrome This is likely from unconscious state lying in left lateral decubitus position Will continue IV fluids and follow serial CPK Holding statin

## 2022-12-01 NOTE — Assessment & Plan Note (Signed)
Transaminitis AST 165 ALT 63 with elevated alkaline phosphatase at 395 Obtain acute hepatitis panel right upper quadrant Possibly simply secondary to the patient's ethanol abuse or rhabdo

## 2022-12-01 NOTE — Assessment & Plan Note (Signed)
Metabolic encephalopathy Likely secondary to the patient's underlying sepsis Will obtain ammonia speech and language consultation TSH B12 and UDS CT of the head did not have any acute events

## 2022-12-01 NOTE — ED Notes (Signed)
ED TO INPATIENT HANDOFF REPORT  ED Nurse Name and Phone #: Darral Dash RN [phone]  S Name/Age/Gender Robert Molina 72 y.o. male Room/Bed: 034C/034C  Code Status   Code Status: Full Code  Home/SNF/Other Came from motel Patient oriented to: self Is this baseline? No   Triage Complete: Triage complete  Chief Complaint Traumatic rhabdomyolysis (HCC) [T79.6XXA]  Triage Note Per EMS, pt was found on bathroom floor, covered in urine and feces, unknown for how long patient was on the floor Pt initially was not A/O, on arrival to ED patient is alert to self, able to tell us his name  Skin breakdown noted by EMS  EMS gave 100 mL of NS    Allergies No Known Allergies  Level of Care/Admitting Diagnosis ED Disposition     ED Disposition  Admit   Condition  --   Comment  Hospital Area: [facility] [[id number]]  Level of Care: Telemetry Medical [[id number]]  May admit patient to [facility] or [facility] if equivalent level of care is available:: No  Covid Evaluation: Asymptomatic - no recent exposure (last 10 days) testing not required  Diagnosis: Traumatic rhabdomyolysis [facility]) [[id number]]  Admitting Physician: Princess Bruins [[id number]]  Attending Physician: Princess Bruins [[id number]]  Certification:: I certify this patient will need inpatient services for at least 2 midnights  Expected Medical Readiness: 12/04/2022          B Medical/Surgery History Past Medical History:  Diagnosis Date   Alcohol abuse    BPH (benign prostatic hyperplasia)    Hypertension    Mixed hyperlipidemia    Onychomycosis    Pneumonia    hx of about  14yrs ago   Past Surgical History:  Procedure Laterality Date   PAROTIDECTOMY Right 05/06/2013   Procedure: PAROTIDECTOMY;  Surgeon: Melvenia Beam, MD;  Location: [facility] OR;  Service: ENT;  Laterality: Right;  With Nerve Monitor   RADICAL NECK DISSECTION Right 05/06/2013   Procedure: RADICAL NECK DISSECTION;  Surgeon: Melvenia Beam, MD;  Location:  [facility] OR;  Service: ENT;  Laterality: Right;     A IV Location/Drains/Wounds Patient Lines/Drains/Airways Status     Active Line/Drains/Airways     Name Placement date Placement time Site Days   Peripheral IV 12/01/22 22 G Anterior;Left Forearm 12/01/22  0958  Forearm  less than 1   Peripheral IV 12/01/22 20 G Posterior;Right Wrist 12/01/22  1124  Wrist  less than 1   Closed System Drain 05/06/13  --  --  3496   Incision 05/06/13 Neck Right 05/06/13  1430  -- 3496            Intake/Output Last 24 hours No intake or output data in the 24 hours ending 12/01/22 1405  Labs/Imaging Results for orders placed or performed during the hospital encounter of 12/01/22 (from the past 48 hour(s))  CBC with Differential/Platelet     Status: Abnormal   Collection Time: 12/01/22 10:27 AM  Result Value Ref Range   WBC 6.0 4.0 - 10.5 K/uL   RBC 4.25 4.22 - 5.81 MIL/uL   Hemoglobin 10.7 (L) 13.0 - 17.0 g/dL   HCT 13.0 (L) 86.5 - 78.4 %   MCV 84.9 80.0 - 100.0 fL   MCH 25.2 (L) 26.0 - 34.0 pg   MCHC 29.6 (L) 30.0 - 36.0 g/dL   RDW 69.6 (H) 29.5 - 28.4 %   Platelets 318 150 - 400 K/uL    Comment: REPEATED TO VERIFY   nRBC 3.9 (  H) 0.0 - 0.2 %   Neutrophils Relative % 78 %   Neutro Abs 4.6 1.7 - 7.7 K/uL   Lymphocytes Relative 12 %   Lymphs Abs 0.7 0.7 - 4.0 K/uL   Monocytes Relative 9 %   Monocytes Absolute 0.6 0.1 - 1.0 K/uL   Eosinophils Relative 0 %   Eosinophils Absolute 0.0 0.0 - 0.5 K/uL   Basophils Relative 0 %   Basophils Absolute 0.0 0.0 - 0.1 K/uL   Immature Granulocytes 1 %   Abs Immature Granulocytes 0.06 0.00 - 0.07 K/uL    Comment: Performed at [facility], [address] [place], [place] [address]  CBG monitoring, ED     Status: None   Collection Time: 12/01/22 10:28 AM  Result Value Ref Range   Glucose-Capillary 75 70 - 99 mg/dL    Comment: Glucose reference range applies only to samples taken after fasting for at least 8 hours.  Comprehensive metabolic panel      Status: Abnormal   Collection Time: 12/01/22 10:45 AM  Result Value Ref Range   Sodium 143 135 - 145 mmol/L   Potassium 3.9 3.5 - 5.1 mmol/L   Chloride 112 (H) 98 - 111 mmol/L   CO2 14 (L) 22 - 32 mmol/L   Glucose, Bld 99 70 - 99 mg/dL    Comment: Glucose reference range applies only to samples taken after fasting for at least 8 hours.   BUN 55 (H) 8 - 23 mg/dL   Creatinine, Ser 1.91 (H) 0.61 - 1.24 mg/dL   Calcium 8.4 (L) 8.9 - 10.3 mg/dL   Total Protein 6.1 (L) 6.5 - 8.1 g/dL   Albumin 2.3 (L) 3.5 - 5.0 g/dL   AST 478 (H) 15 - 41 U/L   ALT 63 (H) 0 - 44 U/L   Alkaline Phosphatase 395 (H) 38 - 126 U/L   Total Bilirubin 0.8 0.3 - 1.2 mg/dL   GFR, Estimated 55 (L) >60 mL/min    Comment: (NOTE) Calculated using the CKD-EPI Creatinine Equation (2021)    Anion gap 17 (H) 5 - 15    Comment: Performed at [facility], [address] [place], [place] [address]  Ethanol     Status: None   Collection Time: 12/01/22 10:45 AM  Result Value Ref Range   Alcohol, Ethyl (B) <10 <10 mg/dL    Comment: (NOTE) Lowest detectable limit for serum alcohol is 10 mg/dL.  For medical purposes only. Performed at [facility], [address], [place], [place] [address]   CK     Status: Abnormal   Collection Time: 12/01/22 10:45 AM  Result Value Ref Range   Total CK 6,035 (H) 49 - 397 U/L    Comment: RESULT CONFIRMED BY MANUAL DILUTION Performed at [facility], [address] [place], [place] [address]   I-Stat venous blood gas, ED     Status: Abnormal   Collection Time: 12/01/22 10:49 AM  Result Value Ref Range   pH, Ven 6.912 (LL) 7.25 - 7.43   pCO2, Ven 60.1 (H) 44 - 60 mmHg   pO2, Ven 37 32 - 45 mmHg   Bicarbonate 12.1 (L) 20.0 - 28.0 mmol/L   TCO2 14 (L) 22 - 32 mmol/L   O2 Saturation 39 %   Acid-base deficit 20.0 (H) 0.0 - 2.0 mmol/L   Sodium 146 (H) 135 - 145 mmol/L   Potassium 2.6 (LL) 3.5 - 5.1 mmol/L   Calcium,  Ion <0.30 (LL) 1.15 - 1.40 mmol/L   HCT 28.0  (L) 39.0 - 52.0 %   Hemoglobin 9.5 (L) 13.0 - 17.0 g/dL   Sample type VENOUS    Comment NOTIFIED PHYSICIAN   I-Stat Lactic Acid     Status: Abnormal   Collection Time: 12/01/22 10:50 AM  Result Value Ref Range   Lactic Acid, Venous 2.7 (HH) 0.5 - 1.9 mmol/L   Comment NOTIFIED PHYSICIAN   Protime-INR     Status: Abnormal   Collection Time: 12/01/22  1:00 PM  Result Value Ref Range   Prothrombin Time 16.0 (H) 11.4 - 15.2 seconds   INR 1.3 (H) 0.8 - 1.2    Comment: (NOTE) INR goal varies based on device and disease states. Performed at [facility], [address] [place], [place] [address]   APTT     Status: None   Collection Time: 12/01/22  1:00 PM  Result Value Ref Range   aPTT 24 24 - 36 seconds    Comment: Performed at [facility], [address] [place], [place] [address]  I-Stat Lactic Acid     Status: Abnormal   Collection Time: 12/01/22  1:08 PM  Result Value Ref Range   Lactic Acid, Venous 3.5 (HH) 0.5 - 1.9 mmol/L   Comment NOTIFIED PHYSICIAN   I-Stat venous blood gas, ED     Status: Abnormal   Collection Time: 12/01/22  1:08 PM  Result Value Ref Range   pH, Ven 7.307 7.25 - 7.43   pCO2, Ven 34.4 (L) 44 - 60 mmHg   pO2, Ven 52 (H) 32 - 45 mmHg   Bicarbonate 17.2 (L) 20.0 - 28.0 mmol/L   TCO2 18 (L) 22 - 32 mmol/L   O2 Saturation 83 %   Acid-base deficit 8.0 (H) 0.0 - 2.0 mmol/L   Sodium 144 135 - 145 mmol/L   Potassium 3.5 3.5 - 5.1 mmol/L   Calcium, Ion 1.06 (L) 1.15 - 1.40 mmol/L   HCT 30.0 (L) 39.0 - 52.0 %   Hemoglobin 10.2 (L) 13.0 - 17.0 g/dL   Sample type VENOUS    DG Pelvis Portable  Result Date: 12/01/2022 CLINICAL DATA:  Fall.  Patient found down on bathroom floor. EXAM: PORTABLE PELVIS 1-2 VIEWS COMPARISON:  None Available. FINDINGS: Diffuse sclerotic changes are present throughout the axial skeleton. No acute fractures are present. Soft tissues are unremarkable. IMPRESSION: 1. No acute abnormality. 2. Diffuse sclerotic changes  throughout the axial skeleton compatible with metastatic disease. Electronically Signed   By: Marin Roberts M.D.   On: 12/01/2022 12:14   DG Chest Port 1 View  Result Date: 12/01/2022 CLINICAL DATA:  Fall. Trauma. Patient was found down on the bathroom floor. EXAM: PORTABLE CHEST 1 VIEW COMPARISON:  One-view chest x-ray 08/07/2019 FINDINGS: The heart size is normal. Changes of COPD are present. The lungs are clear. No edema or effusion is present. The visualized soft tissues and bony thorax are unremarkable. No acute fractures are present. Diffuse sclerotic changes are present throughout the axial skeleton, new since the prior exam. IMPRESSION: 1. No acute cardiopulmonary disease. 2. Diffuse sclerotic changes throughout the axial skeleton, new since the prior exam. Findings are consistent with metastatic disease or myeloma. Electronically Signed   By: Marin Roberts M.D.   On: 12/01/2022 12:12   CT Head Wo Contrast  Result Date: 12/01/2022 CLINICAL DATA:  Provided history: Head trauma, minor. Neck trauma. Patient found down on bathroom floor, skin breakdown  noted by EMS. EXAM: CT HEAD WITHOUT CONTRAST CT CERVICAL SPINE WITHOUT CONTRAST TECHNIQUE: Multidetector CT imaging of the head and cervical spine was performed following the standard protocol without intravenous contrast. Multiplanar CT image reconstructions of the cervical spine were also generated. RADIATION DOSE REDUCTION: This exam was performed according to the departmental dose-optimization program which includes automated exposure control, adjustment of the mA and/or kV according to patient size and/or use of iterative reconstruction technique. COMPARISON:  Head CT 11/13/2019.  Neck CT 04/10/2013. FINDINGS: CT HEAD FINDING Brain: Generalized cerebral atrophy. There is no acute intracranial hemorrhage. No demarcated cortical infarct. No extra-axial fluid collection. No evidence of an intracranial mass. No midline shift. Vascular: No  hyperdense vessel. Skull: Portions of the imaged calvarium, skull base, mandible and maxillofacial structures have an irregular and sclerotic appearance. No calvarial fracture. Sinuses/Orbits: No mass or acute finding within the imaged orbits. Mucosal thickening within the bilateral frontal sinuses (mild right, mild to moderate left). Mild mucosal thickening within the bilateral sphenoid sinuses. CT CERVICAL SPINE FINDINGS Alignment: Levocurvature of the cervical spine. Nonspecific straightening of the expected cervical lordosis. Mild C5-C6 grade 1 retrolisthesis. Skull base and vertebrae: The basion-dental and atlanto-dental intervals are maintained.No evidence of acute fracture to the cervical spine. Diffuse irregular sclerotic appearance of the cervical and visualized thoracic spine, of portions of the bilateral clavicles and scapulae, and of bilateral ribs. Soft tissues and spinal canal: No prevertebral fluid or swelling. No visible canal hematoma. Disc levels: Cervical spondylosis with multilevel disc space narrowing, disc bulges/central disc protrusions and facet arthrosis. At C5-C6, there is advanced disc degeneration with a posterior disc osteophyte complex and left-sided uncovertebral hypertrophy. No appreciable high-grade spinal canal stenosis. Multilevel bony neural foraminal narrowing. Upper chest: No visible pneumothorax. Emphysema. 4 mm right upper lobe pulmonary nodule, new from the prior neck CT of 04/10/2013 (series 5, image 107). A 4 mm nodule more anteriorly within the right lung apex has not significantly changed in size as compared to the prior neck CT and is considered benign (series 5, image 111). IMPRESSION: CT head: 1.  No evidence of an acute intracranial abnormality. 2. Generalized cerebral atrophy. 3. Portions of the imaged calvarium, skull base, mandible and maxillofacial structures have an irregular and sclerotic appearance. This likely reflects sequelae of widespread osseous metastatic  disease (does the patient have a history of prostate cancer?) 4. Paranasal sinus disease as described. CT cervical spine: 1. No evidence of an acute cervical spine fracture. 2. Levocurvature of the cervical spine. 3. Nonspecific straightening of the expected cervical lordosis. 4. Mild C5-C6 grade 1 retrolisthesis. 5. Cervical spondylosis as described. 6. Diffuse irregular sclerotic appearance of the cervical and visualized thoracic spine, of portions of the bilateral clavicles and scapulae, and of bilateral ribs. This likely reflects sequelae of widespread osseous metastatic disease (does the patient have a history of prostate cancer?) 7. 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 8. Emphysema (ICD10-J43.9). Electronically Signed   By: Jackey Loge D.O.   On: 12/01/2022 11:33   CT Cervical Spine Wo Contrast  Result Date: 12/01/2022 CLINICAL DATA:  Provided history: Head trauma, minor. Neck trauma. Patient found down on bathroom floor, skin breakdown noted by EMS. EXAM: CT HEAD WITHOUT CONTRAST CT CERVICAL SPINE WITHOUT CONTRAST TECHNIQUE: Multidetector CT imaging of the head and cervical spine was performed following the standard protocol without intravenous contrast. Multiplanar CT image reconstructions of the  cervical spine were also generated. RADIATION DOSE REDUCTION: This exam was performed according to the departmental dose-optimization program which includes automated exposure control, adjustment of the mA and/or kV according to patient size and/or use of iterative reconstruction technique. COMPARISON:  Head CT 11/13/2019.  Neck CT 04/10/2013. FINDINGS: CT HEAD FINDING Brain: Generalized cerebral atrophy. There is no acute intracranial hemorrhage. No demarcated cortical infarct. No extra-axial fluid collection. No evidence of an intracranial mass. No midline shift. Vascular: No hyperdense vessel.  Skull: Portions of the imaged calvarium, skull base, mandible and maxillofacial structures have an irregular and sclerotic appearance. No calvarial fracture. Sinuses/Orbits: No mass or acute finding within the imaged orbits. Mucosal thickening within the bilateral frontal sinuses (mild right, mild to moderate left). Mild mucosal thickening within the bilateral sphenoid sinuses. CT CERVICAL SPINE FINDINGS Alignment: Levocurvature of the cervical spine. Nonspecific straightening of the expected cervical lordosis. Mild C5-C6 grade 1 retrolisthesis. Skull base and vertebrae: The basion-dental and atlanto-dental intervals are maintained.No evidence of acute fracture to the cervical spine. Diffuse irregular sclerotic appearance of the cervical and visualized thoracic spine, of portions of the bilateral clavicles and scapulae, and of bilateral ribs. Soft tissues and spinal canal: No prevertebral fluid or swelling. No visible canal hematoma. Disc levels: Cervical spondylosis with multilevel disc space narrowing, disc bulges/central disc protrusions and facet arthrosis. At C5-C6, there is advanced disc degeneration with a posterior disc osteophyte complex and left-sided uncovertebral hypertrophy. No appreciable high-grade spinal canal stenosis. Multilevel bony neural foraminal narrowing. Upper chest: No visible pneumothorax. Emphysema. 4 mm right upper lobe pulmonary nodule, new from the prior neck CT of 04/10/2013 (series 5, image 107). A 4 mm nodule more anteriorly within the right lung apex has not significantly changed in size as compared to the prior neck CT and is considered benign (series 5, image 111). IMPRESSION: CT head: 1.  No evidence of an acute intracranial abnormality. 2. Generalized cerebral atrophy. 3. Portions of the imaged calvarium, skull base, mandible and maxillofacial structures have an irregular and sclerotic appearance. This likely reflects sequelae of widespread osseous metastatic disease (does the  patient have a history of prostate cancer?) 4. Paranasal sinus disease as described. CT cervical spine: 1. No evidence of an acute cervical spine fracture. 2. Levocurvature of the cervical spine. 3. Nonspecific straightening of the expected cervical lordosis. 4. Mild C5-C6 grade 1 retrolisthesis. 5. Cervical spondylosis as described. 6. Diffuse irregular sclerotic appearance of the cervical and visualized thoracic spine, of portions of the bilateral clavicles and scapulae, and of bilateral ribs. This likely reflects sequelae of widespread osseous metastatic disease (does the patient have a history of prostate cancer?) 7. 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 8. Emphysema (ICD10-J43.9). Electronically Signed   By: Jackey Loge D.O.   On: 12/01/2022 11:33    Pending Labs Unresulted Labs (From admission, onward)     Start     Ordered   12/08/22 0500  Creatinine, serum  (enoxaparin (LOVENOX)    CrCl >/= 30 ml/min)  Weekly,   R     Comments: while on enoxaparin therapy    12/01/22 1354   12/02/22 0500  Protime-INR  Tomorrow morning,   R        12/01/22 1354   12/02/22 0500  CK  Tomorrow morning,   R        12/01/22 1359   12/02/22 0500  CBC  Tomorrow morning,   R        12/01/22 1359   12/02/22 0500  Comprehensive metabolic panel  Tomorrow morning,   R        12/01/22 1359   12/01/22 1700  CK  Once-Timed,   TIMED        12/01/22 1359   12/01/22 1700  Comprehensive metabolic panel  Once-Timed,   TIMED        12/01/22 1359   12/01/22 1400  PSA  Once,   R        12/01/22 1359   12/01/22 1400  Phosphorus  Once,   R        12/01/22 1359   12/01/22 1359  Ammonia  Once,   R        12/01/22 1359   12/01/22 1359  Vitamin B12  Once,   R        12/01/22 1359   12/01/22 1359  TSH  Once,   R        12/01/22 1359   12/01/22 1359  Hepatitis panel, acute  Once,   R        12/01/22 1359    12/01/22 1357  Lactic acid, plasma  (Lactic Acid)  STAT Now then every 3 hours,   R (with STAT occurrences)      12/01/22 1359   12/01/22 1352  HIV Antibody (routine testing w rflx)  (HIV Antibody (Routine testing w reflex) panel)  Once,   R        12/01/22 1354   12/01/22 1057  Blood Culture (routine x 2)  (Undifferentiated presentation (screening labs and basic nursing orders))  BLOOD CULTURE X 2,   STAT      12/01/22 1056   12/01/22 1018  CBC with Differential  Once,   STAT        12/01/22 1018   12/01/22 1018  Urinalysis, Routine w reflex microscopic -Urine, Clean Catch  Once,   URGENT       Question:  Specimen Source  Answer:  Urine, Clean Catch   12/01/22 1018   12/01/22 1018  Urine rapid drug screen (hosp performed)  Once,   STAT        12/01/22 1018            Vitals/Pain Today's Vitals   12/01/22 1300 12/01/22 1315 12/01/22 1330 12/01/22 1401  BP: 102/69 109/66 127/62   Pulse: 94 87 98   Resp: 12 16 18    Temp:    (!) 97.4 F (36.3 C)  TempSrc:    Oral  SpO2: 95% 95% 96%   Weight:      Height:      PainSc:        Isolation Precautions No active isolations  Medications Medications  lactated ringers bolus 1,000 mL (has no administration in time range)  enoxaparin (LOVENOX) injection 40 mg (has no administration in time range)  aspirin EC tablet 81 mg (has no administration in time range)  metroNIDAZOLE (FLAGYL) IVPB 500 mg (500 mg Intravenous Not Given 12/01/22 1403)  lactated ringers infusion (has no administration in time range)  LORazepam (ATIVAN) tablet 1-4 mg (has no administration in time range)    Or  LORazepam (ATIVAN) tablet 1 mg (has no administration in time range)  thiamine (VITAMIN B1) tablet 100 mg (has no administration in time range)    Or  thiamine (VITAMIN B1) injection 100 mg (has no administration in time range)  folic acid (FOLVITE)  tablet 1 mg (has no administration in time range)  multivitamin with minerals tablet 1 tablet (has no  administration in time range)  lactated ringers bolus 1,000 mL (0 mLs Intravenous Stopped 12/01/22 1218)  lactated ringers bolus 500 mL (0 mLs Intravenous Stopped 12/01/22 1353)  ceFEPIme (MAXIPIME) 2 g in sodium chloride 0.9 % 100 mL IVPB (0 g Intravenous Stopped 12/01/22 1218)  metroNIDAZOLE (FLAGYL) IVPB 500 mg (0 mg Intravenous Stopped 12/01/22 1246)  vancomycin (VANCOCIN) IVPB 1000 mg/200 mL premix (0 mg Intravenous Stopped 12/01/22 1347)    Mobility walks     Focused Assessments Neuro Assessment Handoff:  Swallow screen pass?  N/A         Neuro Assessment: Exceptions to WDL (altered mental status) Neuro Checks:      Has TPA been given? No If patient is a Neuro Trauma and patient is going to OR before floor call report to 4N Charge nurse: [phone] or [phone]   R Recommendations: See Admitting Provider Note  Report given to:   Additional Notes: Patient alert and orientated to self, The only family member is his cousin, Number is [phone]

## 2022-12-01 NOTE — Assessment & Plan Note (Addendum)
Concern for malignancy Metastatic disease versus myeloma X-ray as well as CT revealed diffuse sclerotic lesions Will obtain a PSA and interval oncology consultation once clinical status is improved Ionized calcium was low on presentation of note We will obtain LDH uric acid 24-hour SPEP and UPEP however we will defer any serum free light chain assay and plasma cell fluorescence as part of the initial diagnostic workup to hematology oncology once patient is stabilized

## 2022-12-01 NOTE — Assessment & Plan Note (Signed)
Hypocalcemia Ionized calcium decreased on admission now up to 1.06 We will start oral supplementation

## 2022-12-01 NOTE — Assessment & Plan Note (Signed)
  Hyperlipidemia Hold statin in the setting of rhabdomyolysis

## 2022-12-01 NOTE — Consult Note (Signed)
Renal Service Consult Note [facility]  Robert Molina 12/01/2022 Maree Krabbe, MD Requesting Physician: Dr. Dorna Mai  Reason for Consult: Renal failure  HPI: The patient is a 72 y.o. year-old w/ PMH as below who presented to ED earlier today, found on bathroom floor, covered in urine and feces, not sure how long. Was alert to self on arrival to ED. In ED pt rec'd IV cefepime and vancomycin, and 2.5L bolus. CT head and C-spine showed no acute findings other than sclerotic lesions. Labs showed K+ 3.5 CO2 14  AG 17  Cl 112, BUN 55, creat 1.37 (b/l creatinine 0.77 from may 2023)  Ca 8.4  alb 2.3.  AST 165, ALT 63, NH3 17. Tbili 0.8  CK 6035, LDH 1019.  LA 3.5, after bolus repeat was 1.7.  WBC 6K  Hb 10.2.  Pt was admitted. Renal team consulted for renal failure.   Pt seen in his room. He is confused and a very poor historian. Does not follow commands.    ROS - n/a   Past Medical History  Past Medical History:  Diagnosis Date   Alcohol abuse    BPH (benign prostatic hyperplasia)    Hypertension    Mixed hyperlipidemia    Onychomycosis    Pneumonia    hx of about  24yrs ago   Past Surgical History  Past Surgical History:  Procedure Laterality Date   PAROTIDECTOMY Right 05/06/2013   Procedure: PAROTIDECTOMY;  Surgeon: Melvenia Beam, MD;  Location: [facility] OR;  Service: ENT;  Laterality: Right;  With Nerve Monitor   RADICAL NECK DISSECTION Right 05/06/2013   Procedure: RADICAL NECK DISSECTION;  Surgeon: Melvenia Beam, MD;  Location: [facility] OR;  Service: ENT;  Laterality: Right;   Family History  Family History  Problem Relation Age of Onset   Diabetes type II Neg Hx    Social History  reports that he has been smoking cigarettes. He has a 44 pack-year smoking history. He has never used smokeless tobacco. He reports current alcohol use. He reports that he does not use drugs. Allergies No Known Allergies Home medications Prior to Admission medications   Medication Sig Start  Date End Date Taking? Authorizing Provider  amLODipine (NORVASC) 10 MG tablet TAKE 1 TABLET (10 MG TOTAL) BY MOUTH DAILY. 12/03/21   Grayce Sessions, NP  carvedilol (COREG) 3.125 MG tablet Take 1 tablet (3.125 mg total) by mouth 2 (two) times daily. 12/03/21   Grayce Sessions, NP  hydrochlorothiazide (HYDRODIURIL) 25 MG tablet Take 1 tablet (25 mg total) by mouth daily. 05/16/21   Grayce Sessions, NP  pravastatin (PRAVACHOL) 20 MG tablet TAKE 1 TABLET (20 MG TOTAL) BY MOUTH DAILY. 12/03/21   Grayce Sessions, NP  tamsulosin (FLOMAX) 0.4 MG CAPS capsule Take 1 capsule (0.4 mg total) by mouth daily. 05/16/21   Grayce Sessions, NP     Vitals:   12/01/22 1430 12/01/22 1518 12/01/22 1800 12/01/22 2028  BP: 110/75 111/64 104/67 (!) 106/58  Pulse: 91 92 87 88  Resp: 16 15 16 16   Temp:  97.8 F (36.6 C) 97.8 F (36.6 C) 98 F (36.7 C)  TempSrc:  Oral Oral Axillary  SpO2: 98% 92% 99% 98%  Weight:      Height:       Exam Gen cachectic, thin AAM No rash, cyanosis or gangrene Sclera anicteric, throat w/ thick whitish secretions  No jvd or bruits, flat neck veins Chest clear bilat to bases, no rales/  wheezing RRR no RG Abd soft ntnd no mass or ascites +bs GU normal male MS no joint effusions or deformity, marked muscle wasting Ext no LE or UE edema, no wounds or ulcers Neuro is confused, does not follow commands, somnolent    Home meds include - nrovasc 10, coreg 3.125 bid, hydrochlorothiazide 25 every day, pravastatin, tamsulosin    VS today --> BP 106/58, HR 100, RR 16, temp 98   RA 98%   K+ 3.5 CO2 14  AG 17  Cl 112  BUN 55  creat 1.37 Ca 8.4  alb 2.3.  AST 165 ALT 63 NH3 17  Tbili 0.8  CK 6035 LDH 1019.  LA 3.5 after bolus repeat was 1.7  WBC 6K  Hb 10.2      Renal US 8/24 --> IMPRESSION: 1. Mild bilateral hydronephrosis. This may be due to bladder distension and chronic bladder outlet obstruction, the bladder appears trabeculated. R kidney 11.3cm, L kidney 11 cm.       UA 8/24 - mod Hb, ketones 20, smal LE, neg nitrite, neg protein, 6-10 rbc/ >50 wbc     UNa, UCr pending     Assessment/ Plan: AKI - b/l creatinine 0.77 from may 2023. Pt presenting w/ AMS, creat 1.3, confused, cachectic, severe wt loss, mild bilat hydronephrosis and multiple bony lesions of the spine suggesting myeloma or metastases. Foley placed earlier. Pt still looks quite volume depleted on exam, even after getting 2.5 L bolus. Will bolus again 2.5 L NS. Cont LR at 100 cc/hr. F/u labs in am. Doubt rhabdomyolysis is causing significant damage at CK 6000. Suspect  AKI due to volume depletion +/- obstruction. Will repeat renal US tomorrow since foley is in place now. Suspect AKI due to volume depletion.  Sepsis - BPs in 70s on presentation, sp IV abx and 2.5 L bolus w/ improved LA on repeat. CXR clear. Getting IV abx.  R/o rhabdomyolysis - CK is at a relatively low level to cause sig kidney damage. Will keep giving him IVF's for now.  HTN - bp's soft now, holding home HTN meds BPH - PSA pending Pressure ulcer - wound care to be consulted Transaminitis      Vinson Moselle  MD [facility] 12/01/2022, 9:58 PM  Recent Labs  Lab 12/01/22 1045 12/01/22 1049 12/01/22 1308 12/01/22 1550  HGB  --  9.5* 10.2*  --   ALBUMIN 2.3*  --   --   --   CALCIUM 8.4*  --   --   --   PHOS  --   --   --  2.6  CREATININE 1.37*  --   --   --   K 3.9 2.6* 3.5  --    Inpatient medications:  aspirin EC  81 mg Oral Daily   calcium carbonate  1 tablet Oral TID WC   enoxaparin (LOVENOX) injection  40 mg Subcutaneous Q24H   folic acid  1 mg Oral Daily   multivitamin with minerals  1 tablet Oral Daily   [START ON 12/02/2022] nicotine  14 mg Transdermal Q0600   tamsulosin  0.4 mg Oral Daily   thiamine  100 mg Oral Daily   Or   thiamine  100 mg Intravenous Daily    [START ON 12/02/2022] ceFEPime (MAXIPIME) IV     lactated ringers 100 mL/hr at 12/01/22 1616   metronidazole     LORazepam **OR** [DISCONTINUED]  LORazepam

## 2022-12-02 ENCOUNTER — Encounter (HOSPITAL_COMMUNITY): Payer: Self-pay | Admitting: Internal Medicine

## 2022-12-02 ENCOUNTER — Inpatient Hospital Stay (HOSPITAL_COMMUNITY): Payer: Medicare Other

## 2022-12-02 DIAGNOSIS — G9341 Metabolic encephalopathy: Secondary | ICD-10-CM

## 2022-12-02 DIAGNOSIS — T796XXS Traumatic ischemia of muscle, sequela: Secondary | ICD-10-CM | POA: Diagnosis not present

## 2022-12-02 DIAGNOSIS — M899 Disorder of bone, unspecified: Secondary | ICD-10-CM | POA: Diagnosis not present

## 2022-12-02 DIAGNOSIS — Z515 Encounter for palliative care: Secondary | ICD-10-CM | POA: Diagnosis not present

## 2022-12-02 DIAGNOSIS — Z7189 Other specified counseling: Secondary | ICD-10-CM | POA: Diagnosis not present

## 2022-12-02 LAB — CBC
HCT: 25.3 % — ABNORMAL LOW (ref 39.0–52.0)
Hemoglobin: 8.2 g/dL — ABNORMAL LOW (ref 13.0–17.0)
MCH: 26.7 pg (ref 26.0–34.0)
MCHC: 32.4 g/dL (ref 30.0–36.0)
MCV: 82.4 fL (ref 80.0–100.0)
Platelets: 247 10*3/uL (ref 150–400)
RBC: 3.07 MIL/uL — ABNORMAL LOW (ref 4.22–5.81)
RDW: 18.8 % — ABNORMAL HIGH (ref 11.5–15.5)
WBC: 4.9 10*3/uL (ref 4.0–10.5)
nRBC: 2.7 % — ABNORMAL HIGH (ref 0.0–0.2)

## 2022-12-02 LAB — COMPREHENSIVE METABOLIC PANEL
ALT: 56 U/L — ABNORMAL HIGH (ref 0–44)
AST: 113 U/L — ABNORMAL HIGH (ref 15–41)
Albumin: 2 g/dL — ABNORMAL LOW (ref 3.5–5.0)
Alkaline Phosphatase: 291 U/L — ABNORMAL HIGH (ref 38–126)
Anion gap: 13 (ref 5–15)
BUN: 40 mg/dL — ABNORMAL HIGH (ref 8–23)
CO2: 18 mmol/L — ABNORMAL LOW (ref 22–32)
Calcium: 8.1 mg/dL — ABNORMAL LOW (ref 8.9–10.3)
Chloride: 115 mmol/L — ABNORMAL HIGH (ref 98–111)
Creatinine, Ser: 1.03 mg/dL (ref 0.61–1.24)
GFR, Estimated: >60 mL/min (ref >60)
Glucose, Bld: 110 mg/dL — ABNORMAL HIGH (ref 70–99)
Potassium: 3.2 mmol/L — ABNORMAL LOW (ref 3.5–5.1)
Sodium: 146 mmol/L — ABNORMAL HIGH (ref 135–145)
Total Bilirubin: 0.9 mg/dL (ref 0.3–1.2)
Total Protein: 5.4 g/dL — ABNORMAL LOW (ref 6.5–8.1)

## 2022-12-02 LAB — CK: Total CK: 3479 U/L — ABNORMAL HIGH (ref 49–397)

## 2022-12-02 LAB — PROTIME-INR
INR: 1.2 (ref 0.8–1.2)
Prothrombin Time: 15.9 s — ABNORMAL HIGH (ref 11.4–15.2)

## 2022-12-02 MED ORDER — HALOPERIDOL LACTATE 2 MG/ML PO CONC: 0.5000 mg | ORAL | Status: DC | PRN

## 2022-12-02 MED ORDER — DEXAMETHASONE SODIUM PHOSPHATE 10 MG/ML IJ SOLN
6.0000 mg | Freq: Four times a day (QID) | INTRAMUSCULAR | Status: DC
  Administered 2022-12-02 – 2022-12-03 (×7): 6 mg via INTRAVENOUS
  Filled 2022-12-02 (×7): qty 1

## 2022-12-02 MED ORDER — HYDROMORPHONE HCL 1 MG/ML IJ SOLN
1.0000 mg | INTRAMUSCULAR | Status: DC
  Administered 2022-12-02 – 2022-12-03 (×3): 1 mg via INTRAVENOUS
  Filled 2022-12-02 (×3): qty 1

## 2022-12-02 MED ORDER — BIOTENE DRY MOUTH MT LIQD: 15.0000 mL | OROMUCOSAL | Status: DC | PRN

## 2022-12-02 MED ORDER — POTASSIUM CHLORIDE CRYS ER 20 MEQ PO TBCR
40.0000 meq | EXTENDED_RELEASE_TABLET | Freq: Once | ORAL | Status: DC
  Filled 2022-12-02: qty 2

## 2022-12-02 MED ORDER — HALOPERIDOL 0.5 MG PO TABS: 0.5000 mg | ORAL_TABLET | ORAL | Status: DC | PRN

## 2022-12-02 MED ORDER — CHLORHEXIDINE GLUCONATE CLOTH 2 % EX PADS
6.0000 | MEDICATED_PAD | Freq: Every day | CUTANEOUS | Status: DC
  Administered 2022-12-02: 6 via TOPICAL

## 2022-12-02 MED ORDER — ACETAMINOPHEN 325 MG PO TABS: 650.0000 mg | ORAL_TABLET | Freq: Four times a day (QID) | ORAL | Status: DC | PRN

## 2022-12-02 MED ORDER — SODIUM CHLORIDE 0.9 % IV SOLN: 2.0000 g | Freq: Two times a day (BID) | INTRAVENOUS | Status: DC

## 2022-12-02 MED ORDER — HALOPERIDOL LACTATE 5 MG/ML IJ SOLN: 0.5000 mg | INTRAMUSCULAR | Status: DC | PRN

## 2022-12-02 MED ORDER — HYDROMORPHONE HCL 1 MG/ML IJ SOLN
0.5000 mg | INTRAMUSCULAR | Status: DC | PRN
  Administered 2022-12-03: 0.5 mg via INTRAVENOUS
  Filled 2022-12-02: qty 0.5

## 2022-12-02 MED ORDER — ONDANSETRON 4 MG PO TBDP: 4.0000 mg | ORAL_TABLET | Freq: Four times a day (QID) | ORAL | Status: DC | PRN

## 2022-12-02 MED ORDER — ONDANSETRON HCL 4 MG/2ML IJ SOLN: 4.0000 mg | Freq: Four times a day (QID) | INTRAMUSCULAR | Status: DC | PRN

## 2022-12-02 MED ORDER — POLYVINYL ALCOHOL 1.4 % OP SOLN: 1.0000 [drp] | Freq: Four times a day (QID) | OPHTHALMIC | Status: DC | PRN

## 2022-12-02 MED ORDER — LORAZEPAM 2 MG/ML IJ SOLN
0.5000 mg | INTRAMUSCULAR | Status: DC
  Administered 2022-12-02 – 2022-12-03 (×3): 0.5 mg via INTRAVENOUS
  Filled 2022-12-02 (×3): qty 1

## 2022-12-02 MED ORDER — GLYCOPYRROLATE 0.2 MG/ML IJ SOLN: 0.2000 mg | INTRAMUSCULAR | Status: DC | PRN

## 2022-12-02 MED ORDER — DEXTROSE-SODIUM CHLORIDE 5-0.2 % IV SOLN: INTRAVENOUS | Status: DC

## 2022-12-02 MED ORDER — ACETAMINOPHEN 650 MG RE SUPP: 650.0000 mg | Freq: Four times a day (QID) | RECTAL | Status: DC | PRN

## 2022-12-02 MED ORDER — PANTOPRAZOLE SODIUM 40 MG IV SOLR
40.0000 mg | INTRAVENOUS | Status: DC
  Administered 2022-12-02: 40 mg via INTRAVENOUS
  Filled 2022-12-02: qty 10

## 2022-12-02 MED ORDER — LORAZEPAM 2 MG/ML IJ SOLN: 0.5000 mg | INTRAMUSCULAR | Status: DC | PRN

## 2022-12-02 MED ORDER — GLYCOPYRROLATE 1 MG PO TABS: 1.0000 mg | ORAL_TABLET | ORAL | Status: DC | PRN

## 2022-12-02 NOTE — Plan of Care (Signed)
  Problem: Education: Goal: Knowledge of General Education information will improve Description: Including pain rating scale, medication(s)/side effects and non-pharmacologic comfort measures Outcome: Progressing   Problem: Clinical Measurements: Goal: Will remain free from infection Outcome: Progressing   Problem: Nutrition: Goal: Adequate nutrition will be maintained Outcome: Progressing   Problem: Elimination: Goal: Will not experience complications related to bowel motility Outcome: Progressing   Problem: Safety: Goal: Ability to remain free from injury will improve Outcome: Progressing   Problem: Skin Integrity: Goal: Risk for impaired skin integrity will decrease Outcome: Progressing

## 2022-12-02 NOTE — Consult Note (Signed)
Palliative Medicine Inpatient Consult Note  Consulting Provider: Starleen Arms, MD   Reason for consult:   Palliative Care Consult Services Palliative Medicine Consult  Reason for Consult? goals of care   12/02/2022  HPI:  Per intake H&P --> [name] is a 72 year old gentleman with a past medical history of hypertension hyperlipidemia who was found unresponsive at the hotel he was living. 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 The PMT has been asked to support additional goals of care conversations.   Clinical Assessment/Goals of Care:  *Please note that this is a verbal dictation therefore any spelling or grammatical errors are due to the "Dragon Medical One" system interpretation.  I have reviewed medical records including EPIC notes, labs and imaging, received report from bedside RN, assessed the patient who is quite confused at the time of assessment.   I met with patient's cousin, [name] and aunt, Rowe Pavy (in [place]) on speaker phone to further discuss diagnosis prognosis, GOC, EOL wishes, disposition and options.   I introduced Palliative Medicine as specialized medical care for people living with serious illness. It focuses on providing relief from the symptoms and stress of a serious illness. The goal is to improve quality of life for both the patient and the family.  Medical History Review and Understanding:  A review of originals past medical history inclusive of hypertension, hyperlipidemia, alcohol misuse, smoking was held.  Social History:  [name] is originally from [place].  He had a 20-year partnership which ended in January 01, 2020 as she passed away of substance abuse.  He does have a son whom he is estranged from in [place].  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  His cousin shares that he has a propensity towards drinking and smoking and is never really taking good care of himself.  He does have  faith.  Functional and Nutritional State:  Preceding hospitalization Macaleb was living in a motel being paid for by [name] and his wife.  He was apparently able to do for himself while at the motel.  Advance Directives:  A detailed discussion was had today regarding advanced directives.  Tiant does not have advanced directives though his aunt Talbert Forest and [place] speak to him regularly and are quite involved in his life.  By default they are his next of kin.  Code Status:  Concepts specific to code status, artifical feeding and hydration, continued IV antibiotics and rehospitalization was had.  The difference between a aggressive medical intervention path  and a palliative comfort care path for this patient at this time was had.   Encouraged patient/family to consider DNR/DNI status understanding evidenced based poor outcomes in similar hospitalized patient, as the cause of arrest is likely associated with advanced chronic/terminal illness rather than an easily reversible acute cardio-pulmonary event. I explained that DNR/DNI does not change the medical plan and it only comes into effect after a person has arrested (died).  It is a protective measure to keep [name] from harming the patient in their last moments of life.  Patient's family is in agreement with CODE STATUS being DO NOT RESUSCITATE DO NOT INTUBATE.  Discussion:  Dr. Randol Kern was able to join this afternoon for conversations regarding goals of care.  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  He shares he had spoken to the oncology team who feel additional  treatment may cause more harm and burden than benefit.  We discussed the options of an aggressive modality of care versus a comfort emphasis of care. We talked about transition to comfort measures in house and what that would entail inclusive of medications to control pain, dyspnea,  agitation, nausea, itching, and hiccups.   We discussed stopping all uneccessary measures such as cardiac monitoring, blood draws, needle sticks, and frequent vital signs. Utilized reflective listening throughout our time together.   Patient's family agree that putting [name] through more than he is already been through would not be [place].  They feel strongly about making him comfortable and allowing him to have a peaceful passing from this earth.  We discussed starting comfort emphasis of care inpatient and sending referrals out for hospice homes.  Patient's family has no preference in regards to which hospice home he goes to.  Discussed the importance of continued conversation with family and their  medical providers regarding overall plan of care and treatment options, ensuring decisions are within the context of the patients values and GOCs. ________________________________ Addendum:  Patient's aunt Talbert Forest called me this late evening and I provided her an update on the above information.  She is in agreement with keeping [name] comfortable and allowing him dignified passing from this earth also.  Decision Maker: Dreakward,Shirley Celine Ahr): [phone]  SUMMARY OF RECOMMENDATIONS   DNAR/DNI  Comfort care  Comfort medications per MAR --> Will initiate low-dose Ativan for potential ETOH withdrawals as well as low-dose Dilaudid for generalized body pain from metastatic disease  Unrestricted visitation  Appreciate the transitions of care team involvement for hospice home placement  Ongoing palliative care support  Code Status/Advance Care Planning: DNAR/DNI  Palliative Prophylaxis:  Aspiration, Bowel Regimen, Delirium Protocol, Frequent Pain Assessment, Oral Care, Palliative Wound Care, and Turn Reposition  Additional Recommendations (Limitations, Scope, Preferences): Comfort care  Psycho-social/Spiritual:  Desire for further Chaplaincy support: Yes Additional  Recommendations: Education on end-of-life in the setting of metastatic disease   Prognosis: Limited weeks  Discharge Planning: Discharge to inpatient hospice once a bed is identified  Vitals:   12/02/22 0654 12/02/22 0800  BP: 108/69 101/61  Pulse: (!) 103 (!) 102  Resp: 20 17  Temp: 98 F (36.7 C) 97.8 F (36.6 C)  SpO2: 93% 91%    Intake/Output Summary (Last 24 hours) at 12/02/2022 1307 Last data filed at 12/02/2022 0400 Gross per 24 hour  Intake 0 ml  Output 1475 ml  Net -1475 ml   Last Weight  Most recent update: 12/02/2022  5:42 AM    Weight  52.1 kg (114 lb 13.8 oz)            Gen:  Elderly AA M chronically ill in appearance HEENT: Poor dentition, moist mucous membranes CV: Irregular rate and rhythm  PULM:  On RA, breathing is even and nonlabored ABD: soft/nontender  EXT: Profound muscle wasting in all extremities Neuro: Disoriented  PPS: 10%   This conversation/these recommendations were discussed with patient primary care team, Dr. Randol Kern   Total Time: 45 Billing based on MDM: High  Problems Addressed: One acute or chronic illness or injury that poses a threat to life or bodily function  Amount and/or Complexity of Data: Category 3:Discussion of management or test interpretation with external physician/other qualified health care professional/appropriate source (not separately reported)  Risks: Decision regarding elective major surgery with identified patient or procedure risk factors and Decision not to resuscitate or to de-escalate care because of poor prognosis  ______________________________________________________ Lamarr Lulas [place] Palliative Medicine Team Team Cell Phone: [phone] Please utilize secure chat with additional questions, if there is no response within 30 minutes please call the above phone number  Palliative Medicine Team providers are available by phone from 7am to 7pm daily and can be reached through the team cell  phone.  Should this patient require assistance outside of these hours, please call the patient's attending physician.

## 2022-12-02 NOTE — Progress Notes (Signed)
   Palliative Medicine Inpatient Follow Up Note   The PMT has observed the consultation for [facility].  At this time, Robert Molina is unable to meaningfully participate in goals of care conversations.   Presently VM's have been left for patients aunt and cousin. Initial consult to be completed after family is spoken with.   No Charge. ______________________________________________________________________________________ Robert Molina [place] Palliative Medicine Team Team Cell Phone: [phone] Please utilize secure chat with additional questions, if there is no response within 30 minutes please call the above phone number  Palliative Medicine Team providers are available by phone from 7am to 7pm daily and can be reached through the team cell phone.  Should this patient require assistance outside of these hours, please call the patient's attending physician.

## 2022-12-02 NOTE — Progress Notes (Signed)
[facility] Progress Note  Subjective: rec'd 2/5 L bolus last night. 1 L UOP yest and 400 cc today so far. Creat down to 1.03, eGFR > 60 ml/min. CO2 18, K 3.2.  Na 146 which is up. CPK down to 3400.   Vitals:   12/01/22 2215 12/02/22 0033 12/02/22 0500 12/02/22 0610  BP:  110/62  92/63  Pulse:  98  (!) 101  Resp:  17  19  Temp:  98.2 F (36.8 C)  97.8 F (36.6 C)  TempSrc:  Axillary  Axillary  SpO2:  96%  94%  Weight: 47.8 kg  52.1 kg   Height:        Exam: Gen cachectic, thin AAM No rash, cyanosis or gangrene No jvd or bruits, flat neck veins Chest clear bilat to bases RRR no RG Abd soft ntnd no mass or ascites +bs MS marked muscle wasting Ext no LE or UE edema Neuro confused, somnolent      Home meds include - nrovasc 10, coreg 3.125 bid, hydrochlorothiazide 25 every day, pravastatin, tamsulosin     VS today --> BP 106/58, HR 100, RR 16, temp 98   RA 98%    Renal US 8/24 --> IMPRESSION: 1. Mild bilateral hydronephrosis. This may be due to bladder distension and chronic bladder outlet obstruction, the bladder appears trabeculated. R kidney 11.3cm, L kidney 11 cm.      UA 8/24 - mod Hb, ketones 20, smal LE, neg nitrite, neg protein, 6-10 rbc/ >50 wbc   Assessment/ Plan: AKI - b/l creatinine 0.77 from may 2023. Pt presenting w/ AMS, creat 1.3, confused, cachectic, severe wt loss, mild bilat hydronephrosis and multiple bony lesions of the spine suggesting myeloma or metastases. Foley placed earlier. Pt still looks quite volume depleted on exam despite prior 2.5 L bolus. Doubt rhabdomyolysis is causing significant renal damage at CK 6000. Suspect AKI is due to volume depletion +/- obstruction.  Foley has been placed. We gave another 2.5 L NS bolus and LR cont'd overnight at 100 cc/hr. Creat is normal at 1.0 today. No further suggestions, will sign off.   Mild bilat hydronephrosis - foley cath placed yesterday. CT abd this am shows no hydronephrosis since foley cath  placed. Renal US pending as well. Suspect foley has corrected the obstruction.  Sepsis - BPs in 70s on presentation, high LA initially then improved after IV abx and 2.5 L bolus fluids. BP's now stable in the low 100s. CXR clear. IV abx per pmd  Hypernatremia - dc'd LR and started D5 1/4 NS at 75 cc/hr to help get Na+ down. This can be titrated down or off as patient's po intake improves.  Acute rhabdomyolysis - Cause may have been pressure injury from lying on the floor for protracted amount of time. CK is relatively low, doubt this has caused sig kidney damage.  HTN - bp's soft, home HTN meds not needed at this time Pressure ulcer  Transaminitis     Vinson Moselle MD  [facility] 12/02/2022, 6:52 AM  Recent Labs  Lab 12/01/22 1308 12/01/22 1550 12/01/22 2118 12/02/22 0434  HGB 10.2*  --   --  8.2*  ALBUMIN  --   --  2.2* 2.0*  CALCIUM  --   --  8.4* 8.1*  PHOS  --  2.6  --   --   CREATININE  --   --  1.05 1.03  K 3.5  --  3.7 3.2*   No results for input(s): "IRON", "  TIBC", "FERRITIN" in the last 168 hours. Inpatient medications:  aspirin EC  81 mg Oral Daily   calcium carbonate  1 tablet Oral TID WC   enoxaparin (LOVENOX) injection  40 mg Subcutaneous Q24H   folic acid  1 mg Oral Daily   multivitamin with minerals  1 tablet Oral Daily   nicotine  14 mg Transdermal Q0600   tamsulosin  0.4 mg Oral Daily   thiamine  100 mg Oral Daily   Or   thiamine  100 mg Intravenous Daily    ceFEPime (MAXIPIME) IV     lactated ringers 100 mL/hr at 12/02/22 0544   metronidazole 500 mg (12/02/22 0127)   LORazepam **OR** [DISCONTINUED] LORazepam

## 2022-12-02 NOTE — Plan of Care (Signed)

## 2022-12-02 NOTE — Progress Notes (Signed)
Pharmacy Antibiotic Note  Robert Molina is a 72 y.o. male admitted on 12/01/2022 presenting with AMS, concern for sepsis.  Pharmacy has been consulted for cefepime dosing.  Given cefepime 2g IV x 1 and vancomycin x1 in ED  Renal function has improved and seems concerns for sepsis remains, so will adjust cefepime dose appropriately.  Plan: Change cefepime to 2g IV q12h Monitor renal function, Cx and clinical progression to narrow  Height: 5\' 11"  (180.3 cm) Weight: 52.1 kg (114 lb 13.8 oz) IBW/kg (Calculated) : 75.3  Temp (24hrs), Avg:97.9 F (36.6 C), Min:97.4 F (36.3 C), Max:98.4 F (36.9 C)  Recent Labs  Lab 12/01/22 1027 12/01/22 1045 12/01/22 1050 12/01/22 1308 12/01/22 1550 12/01/22 2118 12/02/22 0434  WBC 6.0  --   --   --   --   --  4.9  CREATININE  --  1.37*  --   --   --  1.05 1.03  LATICACIDVEN  --   --  2.7* 3.5* 1.8 1.7  --     Estimated Creatinine Clearance: 49.2 mL/min (by C-G formula based on SCr of 1.03 mg/dL).    No Known Allergies  Nicole Kindred, PharmD PGY1 Pharmacy Resident 12/02/2022 2:01 PM

## 2022-12-02 NOTE — Progress Notes (Signed)
PROGRESS NOTE    Robert Molina  NWG:[id number] DOB: 12-11-1950 DOA: 12/01/2022 PCP: Grayce Sessions, NP    Chief Complaint  Patient presents with   Altered Mental Status    Brief Narrative:   Robert Molina is a 72 year old gentleman with a past medical history of hypertension hyperlipidemia who was found unresponsive at the hotel he was living.  Collateral history was obtained from the patient's cousin Robert Molina at the bedside who reports since Wednesday, 11/28/2022 he has been pounding on the patient's door with no answer last known well Monday, 11/26/2022.  This culminated today and a wellness check in which the patient was found unresponsive reportedly upon discussion with my ER colleague arriving oriented only to self.  -In ED patient was altered, treated empirically with antibiotics, including CT head, C-spine, and CT abdomen pelvis, was significant for widely metastatic sclerotic lesions, concern for CVA was present as well, so MRI brain has been obtained, which was significant for brain metastasis, patient PSA significantly elevated at>1500.     Assessment & Plan:   Principal Problem:   Traumatic rhabdomyolysis (HCC) Active Problems:   Severe sepsis (HCC)   CVA (cerebral vascular accident) (HCC)   Acute metabolic encephalopathy   AKI (acute kidney injury) (HCC)   Metabolic acidosis   Hypokalemia   Hypocalcemia   Normocytic anemia   Lung nodule   Malignancy (HCC)   Alcohol abuse   Thiamine deficiency   Transaminitis   Pressure ulcer   BPH (benign prostatic hyperplasia)   Essential hypertension   Hyperlipidemia   Nicotine dependence  Wildly metastatic malignancy Brain metastasis Elevated PSA -Patient imaging significant for widely metastatic disease, including his entire skeletal system, retroperitoneal adenopathy, right brain with evidence of multiple brain mets with edema. -PSA significantly elevated -Will start on IV Decadron -Follow on multiple myeloma  workup  Traumatic rhabdomyolysis [facility]) Traumatic rhabdomyolysis Patient presents with CPK of 6035 on clinical exam no evidence of compartment syndrome This is likely from unconscious state lying in left lateral decubitus position Continue with IV fluids, trend CPK.  Sepsis -Workup only significant for positive UA, has been treated for UTI, continue cefepime.  Acute metabolic encephalopathy -Patient significantly confused, oriented x 1, this is most likely in the setting of widely metastatic disease, failure to thrive, and brain metastasis.  -Ammonia, B12 and TSH within normal limit   Nicotine dependence -He remains on nicotine patch   Hyperlipidemia Hold statin in the setting of rhabdomyolysis   Essential hypertension -Blood pressures acceptable soft, continue to hold meds  Hypokalemia -Repleted  BPH (benign prostatic hyperplasia) -Continue with Flomax   Pressure ulcer POA Pressure wounds present on admission Patient has left pectoral pressure wounds consulting wound care   Transaminitis AST 165 ALT 63 with elevated alkaline phosphatase at 395 Most likely due shock Evidence of liver mets on imaging   Alcohol abuse Ethanol abuse CIWA protocol with PRN Benzodiazepines   Failure to thrive Severe protein calorie malnutrition -Is most likely in setting of malignancy, will start on supplements, will monitor for refeeding syndrome   Lung nodule  Right upper lobe nodule of the lung Interval follow-up with both pulmonology Notable in setting of possible Mets No active pulm symptoms   Normocytic anemia  Hypocalcemia Hypocalcemia Ionized calcium decreased on admission now up to 1.06 We will start oral supplementation   Hypokalemia Repleted   Metabolic acidosis Metabolic acidosis Initial pH 6.91 now increased to 7.3 with a bicarb 12 now up to 17 This is all  likely secondary to sepsis and volume depletion, we will follow serially No evidence of respiratory  failure   AKI (acute kidney injury) (HCC) Acute kidney injury Creatinine 1.37, likely secondary to volume depletion possibly sepsis in the patient's traumatic rhabdomyolysis Will continue crystalloid, obtain ultrasound of the retroperitoneum to rule out hydronephrosis given his concern for malignancy, bladder scans, serial BMP       DVT prophylaxis: (lovenox) Code Status: (Full) Family Communication: Disussed with cousin Robert Molina this afternoon, will admit him this afternoon Disposition:   Status is: Inpatient    Consultants:  renal Palliative  Subjective:  Significant events overnight as discussed with staff, patient is confused, unable to provide any reliable complaints.  Objective: Vitals:   12/02/22 0500 12/02/22 0610 12/02/22 0654 12/02/22 0800  BP:  92/63 108/69 101/61  Pulse:  (!) 101 (!) 103 (!) 102  Resp:  19 20 17   Temp:  97.8 F (36.6 C) 98 F (36.7 C) 97.8 F (36.6 C)  TempSrc:  Axillary Axillary Oral  SpO2:  94% 93% 91%  Weight: 52.1 kg     Height:        Intake/Output Summary (Last 24 hours) at 12/02/2022 1317 Last data filed at 12/02/2022 0400 Gross per 24 hour  Intake 0 ml  Output 1475 ml  Net -1475 ml   Filed Weights   12/01/22 1010 12/01/22 2215 12/02/22 0500  Weight: 52.2 kg 47.8 kg 52.1 kg    Examination:  Awake, oriented x 1, extremely cachectic, emaciated, deconditioned, frail, ill-appearing Symmetrical Chest wall movement, Good air movement bilaterally,  RRR,No Gallops,Rubs or new Murmurs, No Parasternal Heave +ve B.Sounds, Abd Soft, avoid No Cyanosis, Clubbing or edema, with pressure ulcer in chest, right knee and left hip area, please see pictures below           Data Reviewed: I have personally reviewed following labs and imaging studies  CBC: Recent Labs  Lab 12/01/22 1027 12/01/22 1049 12/01/22 1308 12/02/22 0434  WBC 6.0  --   --  4.9  NEUTROABS 4.6  --   --   --   HGB 10.7* 9.5* 10.2* 8.2*  HCT 36.1* 28.0*  30.0* 25.3*  MCV 84.9  --   --  82.4  PLT 318  --   --  247    Basic Metabolic Panel: Recent Labs  Lab 12/01/22 1045 12/01/22 1049 12/01/22 1308 12/01/22 1550 12/01/22 2118 12/02/22 0434  NA 143 146* 144  --  144 146*  K 3.9 2.6* 3.5  --  3.7 3.2*  CL 112*  --   --   --  109 115*  CO2 14*  --   --   --  20* 18*  GLUCOSE 99  --   --   --  124* 110*  BUN 55*  --   --   --  47* 40*  CREATININE 1.37*  --   --   --  1.05 1.03  CALCIUM 8.4*  --   --   --  8.4* 8.1*  MG  --   --   --  2.3  --   --   PHOS  --   --   --  2.6  --   --     GFR: Estimated Creatinine Clearance: 49.2 mL/min (by C-G formula based on SCr of 1.03 mg/dL).  Liver Function Tests: Recent Labs  Lab 12/01/22 1045 12/01/22 2118 12/02/22 0434  AST 165* 134* 113*  ALT 63* 61* 56*  ALKPHOS 395*  343* 291*  BILITOT 0.8 1.0 0.9  PROT 6.1* 5.8* 5.4*  ALBUMIN 2.3* 2.2* 2.0*    CBG: Recent Labs  Lab 12/01/22 1028  GLUCAP 75     Recent Results (from the past 240 hour(s))  Blood Culture (routine x 2)     Status: None (Preliminary result)   Collection Time: 12/01/22 10:57 AM   Specimen: BLOOD RIGHT WRIST  Result Value Ref Range Status   Specimen Description BLOOD RIGHT WRIST  Final   Special Requests   Final    BOTTLES DRAWN AEROBIC ONLY Blood Culture results may not be optimal due to an inadequate volume of blood received in culture bottles   Culture   Final    NO GROWTH < 24 HOURS Performed at [facility], [address], [place], [place] [address]    Report Status PENDING  Incomplete  Blood Culture (routine x 2)     Status: None (Preliminary result)   Collection Time: 12/01/22  3:50 PM   Specimen: BLOOD  Result Value Ref Range Status   Specimen Description BLOOD BLOOD RIGHT ARM  Final   Special Requests   Final    BOTTLES DRAWN AEROBIC AND ANAEROBIC Blood Culture adequate volume   Culture   Final    NO GROWTH < 24 HOURS Performed at [facility], [address], [place],  [place] [address]    Report Status PENDING  Incomplete         Radiology Studies: US RENAL  Result Date: 12/02/2022 CLINICAL DATA:  Follow-up hydronephrosis. Interval placement of Foley catheter. EXAM: RENAL / URINARY TRACT ULTRASOUND COMPLETE COMPARISON:  CT abdomen and pelvis without contrast 12/02/2022. Renal ultrasound 12/01/2022. FINDINGS: Right Kidney: Renal measurements: 11.3 x 4.2 x 5.0 cm = volume: 125 mL. The renal parenchyma is isoechoic to the index organ, the liver. No focal lesions are present. No residual obstruction is present. Left Kidney: Renal measurements: 12.0 x 4.8 x 5.0 cm = volume: 147 mL. Mild residual fullness is present in the left renal collecting system, improved. Renal parenchyma is isoechoic to the index organ, the spleen. Bladder: Urinary bladder is now decompressed around a Foley catheter. Other: None. IMPRESSION: 1. Mild fullness of the left renal collecting system, improved compared to the prior ultrasound. Right-sided hydronephrosis is resolved. 2. Urinary bladder is now decompressed around a Foley catheter. 3. Slight increased echogenicity of the renal parenchyma is nonspecific, but can be seen in the setting of medical renal disease. Electronically Signed   By: Marin Roberts M.D.   On: 12/02/2022 12:43   CT ABDOMEN PELVIS WO CONTRAST  Result Date: 12/02/2022 CLINICAL DATA:  Question bladder neck obstruction. Postrenal obstruction from cancer. EXAM: CT ABDOMEN AND PELVIS WITHOUT CONTRAST TECHNIQUE: Multidetector CT imaging of the abdomen and pelvis was performed following the standard protocol without IV contrast. RADIATION DOSE REDUCTION: This exam was performed according to the departmental dose-optimization program which includes automated exposure control, adjustment of the mA and/or kV according to patient size and/or use of iterative reconstruction technique. COMPARISON:  None Available. FINDINGS: Lower chest: Coronary atherosclerosis. Trace pleural effusions  and mild atelectasis Hepatobiliary: No focal liver abnormality.No evidence of biliary obstruction or stone. Pancreas: Difficult to discretely visualize. No detected abnormality Spleen: Unremarkable. Adrenals/Urinary Tract: The adrenal glands are difficult to discretely visualize.No hydronephrosis. Right more than left renal hilar calcification which could be urinary or atheromatous. The urinary bladder contains a Foley catheter which is normally positioned. Full decompression of the bladder. Stomach/Bowel:  No evidence of bowel obstruction . Vascular/Lymphatic: Diffuse atheromatous plaque. Limited assessment due to paucity of intra-abdominal fat but still there is evidence of confluent adenopathy in the retroperitoneum indistinguishable from the aorta and cava. Reproductive:Symmetric mild enlargement of the prostate Other: No ascites or pneumoperitoneum. Musculoskeletal: Diffuse sclerotic appearance of the skeleton attributed to metastatic disease. IMPRESSION: 1. No hydronephrosis. A Foley catheter decompresses the urinary bladder. 2. Retroperitoneal adenopathy and widespread osseous metastatic disease, correlate with PSA. 3. Extensive atherosclerosis. Electronically Signed   By: Tiburcio Pea M.D.   On: 12/02/2022 06:02   MR BRAIN W WO CONTRAST  Result Date: 12/01/2022 CLINICAL DATA:  Neuro deficit, acute, stroke suspected. EXAM: MRI HEAD WITHOUT AND WITH CONTRAST TECHNIQUE: Multiplanar, multiecho pulse sequences of the brain and surrounding structures were obtained without and with intravenous contrast. CONTRAST:  5mL GADAVIST GADOBUTROL 1 MMOL/ML IV SOLN COMPARISON:  CT head and cervical spine 12/01/2022 FINDINGS: The study is mildly to moderately motion degraded throughout, and this reduces sensitivity for detection of small brain lesions. Brain: There are greater than 15 small enhancing lesions throughout the cerebellum with the largest measuring 9 mm in the left hemisphere (series 17, image 11). There is  only minimal edema associated with some lesions, and there is no posterior fossa mass effect. A 3 mm lesion is questioned in the right parietal lobe on sagittal imaging (series 23, image 8), however this is not confirmed on coronal or axial imaging. There is mild diffuse dural thickening and enhancement associated with suspected diffuse calvarial osseous metastatic disease without significant dural nodularity. No acute infarct, intracranial hemorrhage, midline shift, or extra-axial fluid collection is identified. There is mild cerebral atrophy. No significant white matter disease is seen for age. Vascular: Major intracranial vascular flow voids are preserved. Skull and upper cervical spine: Diffusely abnormal bone marrow signal throughout the skull and included upper cervical spine as well as portions of the mandible corresponding to sclerosis on CT. Sinuses/Orbits: Unremarkable orbits. Mild mucosal thickening in the paranasal sinuses. Small left mastoid effusion. Other: None. IMPRESSION: 1. Motion degraded examination. 2. Numerous small enhancing cerebellar lesions consistent with metastases. Minimal edema without mass effect. 3. Questionable 3 mm right parietal metastasis versus artifact or vascular enhancement. 4. Suspected widespread osteoblastic bone metastases. Electronically Signed   By: Sebastian Ache M.D.   On: 12/01/2022 20:20   US Abdomen Limited RUQ (LIVER/GB)  Result Date: 12/01/2022 CLINICAL DATA:  Elevated LFTs. EXAM: ULTRASOUND ABDOMEN LIMITED RIGHT UPPER QUADRANT COMPARISON:  None Available. FINDINGS: Gallbladder: Physiologically distended. No gallstones. 3 mm gallbladder polyp. Diffuse gallbladder wall thickening, 5-6 mm. No sonographic Murphy sign noted by sonographer. Common bile duct: Diameter: 3-4 mm, normal. Liver: No focal lesion identified. Diffusely increased in parenchymal echogenicity. No definite capsular nodularity. Portal vein is patent on color Doppler imaging with normal direction  of blood flow towards the liver. Other: No right upper quadrant ascites. IMPRESSION: 1. Diffuse gallbladder wall thickening is nonspecific, may be related to chronic liver disease. No gallstones or abnormal gallbladder distension. 2. No biliary dilatation. 3. Diffusely increased hepatic parenchymal echogenicity, hepatic steatosis or other intrinsic hepatocellular disease. Electronically Signed   By: Narda Rutherford M.D.   On: 12/01/2022 17:14   US RENAL  Result Date: 12/01/2022 CLINICAL DATA:  Kidney injury. EXAM: RENAL / URINARY TRACT ULTRASOUND COMPLETE COMPARISON:  None Available. FINDINGS: Right Kidney: Renal measurements: 11.3 x 4.2 x 5.7 cm = volume: 140 mL. Mild hydronephrosis. Borderline increased parenchymal echogenicity. No stone or focal lesion. Left Kidney:  Renal measurements: 11 x 3.2 x 3.8 cm = volume: 70 mL. Mild hydronephrosis. Borderline increased parenchymal echogenicity. No stone or focal lesion. Bladder: Moderately distended. Layering dependent debris. Bladder wall appears trabeculated. Other: None. IMPRESSION: 1. Mild bilateral hydronephrosis. This may be due to bladder distension and chronic bladder outlet obstruction, the bladder appears trabeculated. 2. Layering debris in the urinary bladder, recommend correlation with urinalysis to assess for infection. Electronically Signed   By: Narda Rutherford M.D.   On: 12/01/2022 17:13   DG Shoulder Left  Result Date: 12/01/2022 CLINICAL DATA:  Traumatic rhabdomyolysis. Left shoulder and humerus pain. EXAM: LEFT HUMERUS - 2+ VIEW; LEFT SHOULDER - 2+ VIEW COMPARISON:  None Available. FINDINGS: Shoulder: No acute fracture or dislocation. Diffusely heterogeneous and sclerotic appearance of the osseous structures suspicious for metastatic disease. No soft tissue calcifications. Minor acromioclavicular degenerative change. Humerus: No humeral fracture. Heterogeneous appearance of the humerus with multiple ill-defined sclerotic densities. Soft  tissues appear atrophic. Included left ribs demonstrate heterogeneous bony sclerosis. IMPRESSION: 1. No acute fracture or dislocation of the left shoulder or humerus. 2. Diffusely heterogeneous and sclerotic appearance of the osseous structures suspicious for metastatic disease. Most common etiology in the male patient of this age is prostate cancer. Recommend correlation with PSA and clinical history. Electronically Signed   By: Narda Rutherford M.D.   On: 12/01/2022 15:04   DG Humerus Left  Result Date: 12/01/2022 CLINICAL DATA:  Traumatic rhabdomyolysis. Left shoulder and humerus pain. EXAM: LEFT HUMERUS - 2+ VIEW; LEFT SHOULDER - 2+ VIEW COMPARISON:  None Available. FINDINGS: Shoulder: No acute fracture or dislocation. Diffusely heterogeneous and sclerotic appearance of the osseous structures suspicious for metastatic disease. No soft tissue calcifications. Minor acromioclavicular degenerative change. Humerus: No humeral fracture. Heterogeneous appearance of the humerus with multiple ill-defined sclerotic densities. Soft tissues appear atrophic. Included left ribs demonstrate heterogeneous bony sclerosis. IMPRESSION: 1. No acute fracture or dislocation of the left shoulder or humerus. 2. Diffusely heterogeneous and sclerotic appearance of the osseous structures suspicious for metastatic disease. Most common etiology in the male patient of this age is prostate cancer. Recommend correlation with PSA and clinical history. Electronically Signed   By: Narda Rutherford M.D.   On: 12/01/2022 15:04   DG Pelvis Portable  Result Date: 12/01/2022 CLINICAL DATA:  Fall.  Patient found down on bathroom floor. EXAM: PORTABLE PELVIS 1-2 VIEWS COMPARISON:  None Available. FINDINGS: Diffuse sclerotic changes are present throughout the axial skeleton. No acute fractures are present. Soft tissues are unremarkable. IMPRESSION: 1. No acute abnormality. 2. Diffuse sclerotic changes throughout the axial skeleton compatible with  metastatic disease. Electronically Signed   By: Marin Roberts M.D.   On: 12/01/2022 12:14   DG Chest Port 1 View  Result Date: 12/01/2022 CLINICAL DATA:  Fall. Trauma. Patient was found down on the bathroom floor. EXAM: PORTABLE CHEST 1 VIEW COMPARISON:  One-view chest x-ray 08/07/2019 FINDINGS: The heart size is normal. Changes of COPD are present. The lungs are clear. No edema or effusion is present. The visualized soft tissues and bony thorax are unremarkable. No acute fractures are present. Diffuse sclerotic changes are present throughout the axial skeleton, new since the prior exam. IMPRESSION: 1. No acute cardiopulmonary disease. 2. Diffuse sclerotic changes throughout the axial skeleton, new since the prior exam. Findings are consistent with metastatic disease or myeloma. Electronically Signed   By: Marin Roberts M.D.   On: 12/01/2022 12:12   CT Head Wo Contrast  Result Date: 12/01/2022 CLINICAL DATA:  Provided history: Head trauma, minor. Neck trauma. Patient found down on bathroom floor, skin breakdown noted by EMS. EXAM: CT HEAD WITHOUT CONTRAST CT CERVICAL SPINE WITHOUT CONTRAST TECHNIQUE: Multidetector CT imaging of the head and cervical spine was performed following the standard protocol without intravenous contrast. Multiplanar CT image reconstructions of the cervical spine were also generated. RADIATION DOSE REDUCTION: This exam was performed according to the departmental dose-optimization program which includes automated exposure control, adjustment of the mA and/or kV according to patient size and/or use of iterative reconstruction technique. COMPARISON:  Head CT 11/13/2019.  Neck CT 04/10/2013. FINDINGS: CT HEAD FINDING Brain: Generalized cerebral atrophy. There is no acute intracranial hemorrhage. No demarcated cortical infarct. No extra-axial fluid collection. No evidence of an intracranial mass. No midline shift. Vascular: No hyperdense vessel. Skull: Portions of the imaged  calvarium, skull base, mandible and maxillofacial structures have an irregular and sclerotic appearance. No calvarial fracture. Sinuses/Orbits: No mass or acute finding within the imaged orbits. Mucosal thickening within the bilateral frontal sinuses (mild right, mild to moderate left). Mild mucosal thickening within the bilateral sphenoid sinuses. CT CERVICAL SPINE FINDINGS Alignment: Levocurvature of the cervical spine. Nonspecific straightening of the expected cervical lordosis. Mild C5-C6 grade 1 retrolisthesis. Skull base and vertebrae: The basion-dental and atlanto-dental intervals are maintained.No evidence of acute fracture to the cervical spine. Diffuse irregular sclerotic appearance of the cervical and visualized thoracic spine, of portions of the bilateral clavicles and scapulae, and of bilateral ribs. Soft tissues and spinal canal: No prevertebral fluid or swelling. No visible canal hematoma. Disc levels: Cervical spondylosis with multilevel disc space narrowing, disc bulges/central disc protrusions and facet arthrosis. At C5-C6, there is advanced disc degeneration with a posterior disc osteophyte complex and left-sided uncovertebral hypertrophy. No appreciable high-grade spinal canal stenosis. Multilevel bony neural foraminal narrowing. Upper chest: No visible pneumothorax. Emphysema. 4 mm right upper lobe pulmonary nodule, new from the prior neck CT of 04/10/2013 (series 5, image 107). A 4 mm nodule more anteriorly within the right lung apex has not significantly changed in size as compared to the prior neck CT and is considered benign (series 5, image 111). IMPRESSION: CT head: 1.  No evidence of an acute intracranial abnormality. 2. Generalized cerebral atrophy. 3. Portions of the imaged calvarium, skull base, mandible and maxillofacial structures have an irregular and sclerotic appearance. This likely reflects sequelae of widespread osseous metastatic disease (does the patient have a history of  prostate cancer?) 4. Paranasal sinus disease as described. CT cervical spine: 1. No evidence of an acute cervical spine fracture. 2. Levocurvature of the cervical spine. 3. Nonspecific straightening of the expected cervical lordosis. 4. Mild C5-C6 grade 1 retrolisthesis. 5. Cervical spondylosis as described. 6. Diffuse irregular sclerotic appearance of the cervical and visualized thoracic spine, of portions of the bilateral clavicles and scapulae, and of bilateral ribs. This likely reflects sequelae of widespread osseous metastatic disease (does the patient have a history of prostate cancer?) 7. 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 8. Emphysema (ICD10-J43.9). Electronically Signed   By: Jackey Loge D.O.   On: 12/01/2022 11:33   CT Cervical Spine Wo Contrast  Result Date: 12/01/2022 CLINICAL DATA:  Provided history: Head trauma, minor. Neck trauma. Patient found down on bathroom floor, skin breakdown noted by EMS. EXAM: CT HEAD WITHOUT CONTRAST CT CERVICAL SPINE WITHOUT CONTRAST TECHNIQUE: Multidetector CT imaging of the head and cervical spine  was performed following the standard protocol without intravenous contrast. Multiplanar CT image reconstructions of the cervical spine were also generated. RADIATION DOSE REDUCTION: This exam was performed according to the departmental dose-optimization program which includes automated exposure control, adjustment of the mA and/or kV according to patient size and/or use of iterative reconstruction technique. COMPARISON:  Head CT 11/13/2019.  Neck CT 04/10/2013. FINDINGS: CT HEAD FINDING Brain: Generalized cerebral atrophy. There is no acute intracranial hemorrhage. No demarcated cortical infarct. No extra-axial fluid collection. No evidence of an intracranial mass. No midline shift. Vascular: No hyperdense vessel. Skull: Portions of the imaged calvarium,  skull base, mandible and maxillofacial structures have an irregular and sclerotic appearance. No calvarial fracture. Sinuses/Orbits: No mass or acute finding within the imaged orbits. Mucosal thickening within the bilateral frontal sinuses (mild right, mild to moderate left). Mild mucosal thickening within the bilateral sphenoid sinuses. CT CERVICAL SPINE FINDINGS Alignment: Levocurvature of the cervical spine. Nonspecific straightening of the expected cervical lordosis. Mild C5-C6 grade 1 retrolisthesis. Skull base and vertebrae: The basion-dental and atlanto-dental intervals are maintained.No evidence of acute fracture to the cervical spine. Diffuse irregular sclerotic appearance of the cervical and visualized thoracic spine, of portions of the bilateral clavicles and scapulae, and of bilateral ribs. Soft tissues and spinal canal: No prevertebral fluid or swelling. No visible canal hematoma. Disc levels: Cervical spondylosis with multilevel disc space narrowing, disc bulges/central disc protrusions and facet arthrosis. At C5-C6, there is advanced disc degeneration with a posterior disc osteophyte complex and left-sided uncovertebral hypertrophy. No appreciable high-grade spinal canal stenosis. Multilevel bony neural foraminal narrowing. Upper chest: No visible pneumothorax. Emphysema. 4 mm right upper lobe pulmonary nodule, new from the prior neck CT of 04/10/2013 (series 5, image 107). A 4 mm nodule more anteriorly within the right lung apex has not significantly changed in size as compared to the prior neck CT and is considered benign (series 5, image 111). IMPRESSION: CT head: 1.  No evidence of an acute intracranial abnormality. 2. Generalized cerebral atrophy. 3. Portions of the imaged calvarium, skull base, mandible and maxillofacial structures have an irregular and sclerotic appearance. This likely reflects sequelae of widespread osseous metastatic disease (does the patient have a history of prostate  cancer?) 4. Paranasal sinus disease as described. CT cervical spine: 1. No evidence of an acute cervical spine fracture. 2. Levocurvature of the cervical spine. 3. Nonspecific straightening of the expected cervical lordosis. 4. Mild C5-C6 grade 1 retrolisthesis. 5. Cervical spondylosis as described. 6. Diffuse irregular sclerotic appearance of the cervical and visualized thoracic spine, of portions of the bilateral clavicles and scapulae, and of bilateral ribs. This likely reflects sequelae of widespread osseous metastatic disease (does the patient have a history of prostate cancer?) 7. 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 8. Emphysema (ICD10-J43.9). Electronically Signed   By: Jackey Loge D.O.   On: 12/01/2022 11:33        Scheduled Meds:  aspirin EC  81 mg Oral Daily   calcium carbonate  1 tablet Oral TID WC   Chlorhexidine Gluconate Cloth  6 each Topical Daily   dexamethasone (DECADRON) injection  6 mg Intravenous Q6H   enoxaparin (LOVENOX) injection  40 mg Subcutaneous Q24H   folic acid  1 mg Oral Daily   multivitamin with minerals  1 tablet Oral Daily   nicotine  14 mg Transdermal Q0600   pantoprazole (PROTONIX) IV  40 mg Intravenous Q24H   tamsulosin  0.4 mg Oral Daily   thiamine  100 mg Oral Daily   Or   thiamine  100 mg Intravenous Daily   Continuous Infusions:  ceFEPime (MAXIPIME) IV 2 g (12/02/22 1303)   dextrose 5 % and 0.2 % NaCl 75 mL/hr at 12/02/22 0701   metronidazole 500 mg (12/02/22 1301)     LOS: 1 day        Huey Bienenstock, MD [facility]   To contact the attending provider between 7A-7P or the covering provider during after hours 7P-7A, please log into the web site [email or web address] and access using universal [place] password for that web site. If you do not have the password, please call the hospital operator.  12/02/2022, 1:17 PM

## 2022-12-03 DIAGNOSIS — G9341 Metabolic encephalopathy: Secondary | ICD-10-CM | POA: Diagnosis not present

## 2022-12-03 DIAGNOSIS — T796XXS Traumatic ischemia of muscle, sequela: Secondary | ICD-10-CM

## 2022-12-03 DIAGNOSIS — C801 Malignant (primary) neoplasm, unspecified: Secondary | ICD-10-CM

## 2022-12-03 DIAGNOSIS — Z7189 Other specified counseling: Secondary | ICD-10-CM | POA: Diagnosis not present

## 2022-12-03 DIAGNOSIS — Z515 Encounter for palliative care: Secondary | ICD-10-CM | POA: Diagnosis not present

## 2022-12-03 LAB — URINE CULTURE
Culture: NO GROWTH
Special Requests: NORMAL

## 2022-12-03 MED ORDER — HYDROMORPHONE HCL 1 MG/ML IJ SOLN: 0.5000 mg | Freq: Four times a day (QID) | INTRAMUSCULAR | Status: AC

## 2022-12-03 MED ORDER — LORAZEPAM 2 MG/ML IJ SOLN: 0.5000 mg | Freq: Four times a day (QID) | INTRAMUSCULAR | Status: AC

## 2022-12-03 MED ORDER — HYDROMORPHONE HCL 1 MG/ML IJ SOLN
0.5000 mg | Freq: Four times a day (QID) | INTRAMUSCULAR | Status: DC
  Administered 2022-12-03 (×2): 0.5 mg via INTRAVENOUS
  Filled 2022-12-03 (×2): qty 0.5

## 2022-12-03 MED ORDER — LORAZEPAM 2 MG/ML IJ SOLN
0.5000 mg | Freq: Four times a day (QID) | INTRAMUSCULAR | Status: DC
  Administered 2022-12-03 (×2): 0.5 mg via INTRAVENOUS
  Filled 2022-12-03 (×2): qty 1

## 2022-12-03 MED ORDER — HALOPERIDOL 0.5 MG PO TABS: 0.5000 mg | ORAL_TABLET | ORAL | Status: AC | PRN

## 2022-12-03 NOTE — TOC Transition Note (Signed)
Transition of Care [facility]) - CM/SW Discharge Note   Patient Details  Name: Robert Molina MRN: [id number] Date of Birth: January 14, 1951  [facility]) CM/SW Contact:  Mearl Latin, LCSW Phone Number: 12/03/2022, 1:35 PM   Clinical Narrative:    Patient will DC to: [facility] Anticipated DC date: 12/03/22 Family notified: Talbert Forest and [name] Transport by: Angela Nevin   Per MD patient ready for DC to [facility]. RN to call report prior to discharge [phone]). RN, patient, patient's family, and facility notified of DC. Discharge Summary sent to facility. DC packet on chart including signed DNR. Ambulance transport requested for patient.   CSW will sign off for now as social work intervention is no longer needed. Please consult [name] again if new needs arise.     Final next level of care: Skilled Nursing Facility Barriers to Discharge: Barriers Resolved   Patient Goals and CMS Choice CMS Medicare.gov Compare Post Acute Care list provided to:: Patient Represenative (must comment) Choice offered to / list presented to :  (Aunt)  Discharge Placement                Patient chooses bed at:  [facility] Patient to be transferred to facility by: [facility] Name of family member notified: Talbert Forest and [name] Patient and family notified of of transfer: 12/03/22  Discharge Plan and Services Additional resources added to the After Visit Summary for   In-house Referral: Clinical Social Work, Hospice / Palliative Care   Post Acute Care Choice: Residential Hospice Bed                               Social Determinants of Health (SDOH) Interventions SDOH Screenings   Food Insecurity: No Food Insecurity (12/02/2022)  Housing: Patient Unable To Answer (12/02/2022)  Transportation Needs: Patient Unable To Answer (12/02/2022)  Utilities: Patient Unable To Answer (12/02/2022)  Alcohol Screen: Low Risk  (06/09/2021)  Depression (PHQ2-9): Low Risk  (08/31/2021)   Financial Resource Strain: Low Risk  (06/09/2021)  Physical Activity: Sufficiently Active (06/09/2021)  Social Connections: Socially Isolated (06/09/2021)  Stress: No Stress Concern Present (06/09/2021)  Tobacco Use: High Risk (12/02/2022)     Readmission Risk Interventions     No data to display

## 2022-12-03 NOTE — Progress Notes (Addendum)
   Palliative Medicine Inpatient Follow Up Note HPI: Robert Molina is a 72 year old gentleman with a past medical history of hypertension hyperlipidemia who was found unresponsive at the hotel he was living. 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 The PMT has been asked to support additional goals of care conversations.   Today's Discussion 12/03/2022  *Please note that this is a verbal dictation therefore any spelling or grammatical errors are due to the "Dragon Medical One" system interpretation.  Chart reviewed inclusive of vital signs, progress notes, laboratory results, and diagnostic images.   I spoke to patients RN, Pooja this morning who shares in the middle of the night, Robert Molina was more somnolent therefore additional medications were held. We discussed decreasing medication frequency.  On Assessment, Robert Molina is resting comfortably in NAD. He appears to exemplify no non-verbal indicators of discomfort.  Questions and concerns addressed/Palliative Support Provided.   Objective Assessment: Vital Signs Vitals:   12/02/22 1600 12/02/22 1931  BP: 117/70 (!) 87/60  Pulse: 98 90  Resp: 14 10  Temp: 97.8 F (36.6 C) 97.8 F (36.6 C)  SpO2: 93% 90%    Intake/Output Summary (Last 24 hours) at 12/03/2022 0744 Last data filed at 12/02/2022 2314 Gross per 24 hour  Intake 0 ml  Output 600 ml  Net -600 ml   Last Weight  Most recent update: 12/02/2022  5:42 AM    Weight  52.1 kg (114 lb 13.8 oz)            Gen:  Elderly AA M chronically ill in appearance HEENT: Poor dentition, moist mucous membranes CV: Irregular rate and rhythm  PULM:  On 2LPM [place], breathing is even and nonlabored ABD: soft/nontender  EXT: Profound muscle wasting in all extremities Neuro: Somnolent  SUMMARY OF RECOMMENDATIONS   DNAR/DNI   Comfort care   Comfort medications per MAR --> Low dose ativan and dilaudid Q6H ATC  Unrestricted  visitation   Appreciate the transitions of care team involvement for hospice home placement   Ongoing palliative care support  Billing based on MDM: High --> Parenteral control substance modification ______________________________________________________________________________________ Robert Molina Palliative Medicine Team Team Cell Phone: [phone] Please utilize secure chat with additional questions, if there is no response within 30 minutes please call the above phone number  Palliative Medicine Team providers are available by phone from 7am to 7pm daily and can be reached through the team cell phone.  Should this patient require assistance outside of these hours, please call the patient's attending physician.

## 2022-12-03 NOTE — Progress Notes (Signed)
Report given to [facility] at [facility]. Both PIV's are to be left in place as Braylenn is being transferred to [facility] this evening.

## 2022-12-03 NOTE — TOC Initial Note (Addendum)
Transition of Care [facility]) - Initial/Assessment Note    Patient Details  Name: Robert Molina MRN: [id number] Date of Birth: 1951-01-27  [facility] CM/SW Contact:    Mearl Latin, LCSW Phone Number: 12/03/2022, 12:12 PM  Clinical Narrative:                 12pm-CSW received consult for hospice facility placement. CSW spoke with patient's Nyoka Lint and provided facility choice. She is going to contact their local [place] family and confirm their choice and call CSW back.  12:34pm-Per Palliative, patient's nephew [name] selected [facility] and referral was sent for review. CSW will continue to follow for bed availability.  12:51pm-CSW received call from [place] and she stated she had spoken with [name] and he does want [facility]. [facility] consents with him today.   Expected Discharge Plan: Hospice Medical Facility Barriers to Discharge: Hospice Bed not available   Patient Goals and CMS Choice Patient states their goals for this hospitalization and ongoing recovery are:: Comfort CMS Medicare.gov Compare Post Acute Care list provided to:: Patient Represenative (must comment) Choice offered to / list presented to :  (Aunt) [place] ownership interest in [facility].provided to::  Midwife)    Expected Discharge Plan and Services In-house Referral: Clinical Social Work, Hospice / Palliative Care   Post Acute Care Choice: Residential Hospice Bed Living arrangements for the past 2 months: Hotel/Motel                                      Prior Living Arrangements/Services Living arrangements for the past 2 months: Hotel/Motel Lives with:: Self Patient language and need for interpreter reviewed:: Yes Do you feel safe going back to the place where you live?: No      Need for Family Participation in Patient Care: Yes (Comment) Care giver support system in place?: Yes (comment)   Criminal Activity/Legal Involvement Pertinent to Current  Situation/Hospitalization: No - Comment as needed  Activities of Daily Living Home Assistive Devices/Equipment: None ADL Screening (condition at time of admission) Patient's cognitive ability adequate to safely complete daily activities?: No Is the patient deaf or have difficulty hearing?: No Does the patient have difficulty seeing, even when wearing glasses/contacts?: No Does the patient have difficulty concentrating, remembering, or making decisions?: Yes Patient able to express need for assistance with ADLs?: Yes Does the patient have difficulty dressing or bathing?: Yes Independently performs ADLs?: No Communication: Needs assistance Is this a change from baseline?: Pre-admission baseline Dressing (OT): Needs assistance Is this a change from baseline?: Pre-admission baseline Grooming: Needs assistance Is this a change from baseline?: Pre-admission baseline Feeding: Independent Bathing: Needs assistance Is this a change from baseline?: Pre-admission baseline Toileting: Needs assistance Is this a change from baseline?: Pre-admission baseline In/Out Bed: Needs assistance Is this a change from baseline?: Pre-admission baseline Walks in Home: Needs assistance Is this a change from baseline?: Pre-admission baseline Does the patient have difficulty walking or climbing stairs?: Yes Weakness of Legs: Both Weakness of Arms/Hands: None  Permission Sought/Granted Permission sought to share information with : Facility Medical sales representative, Family Supports Permission granted to share information with : No  Share Information with NAME: Talbert Forest  Permission granted to share info w AGENCY: Hospice  Permission granted to share info w Relationship: Aunt  Permission granted to share info w Contact Information: [phone]  Emotional Assessment Appearance:: Appears stated age  Attitude/Demeanor/Rapport: Unable to Assess Affect (typically observed): Unable to Assess Orientation: :   (Disoriented x4) Alcohol / Substance Use: Not Applicable Psych Involvement: No (comment)  Admission diagnosis:  Lytic lesion of bone on x-ray [M89.9] AKI (acute kidney injury) (HCC) [N17.9] Acidosis, metabolic, with respiratory acidosis [E87.4] Traumatic rhabdomyolysis (HCC) [T79.6XXA] Traumatic rhabdomyolysis, initial encounter (HCC) [T79.6XXA] Patient Active Problem List   Diagnosis Date Noted   Traumatic rhabdomyolysis (HCC) 12/01/2022   Severe sepsis (HCC) 12/01/2022   CVA (cerebral vascular accident) (HCC) 12/01/2022   AKI (acute kidney injury) (HCC) 12/01/2022   Metabolic acidosis 12/01/2022   Hypokalemia 12/01/2022   Hypocalcemia 12/01/2022   Normocytic anemia 12/01/2022   Lung nodule 12/01/2022   Malignancy (HCC) 12/01/2022   Alcohol abuse 12/01/2022   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 [place] [address] Phone: [phone] Fax: [phone]     Social Determinants of Health (SDOH) Social History: SDOH Screenings   Food Insecurity: No Food Insecurity (12/02/2022)  Housing: Patient Unable To Answer (12/02/2022)  Transportation Needs: Patient Unable To Answer (12/02/2022)  Utilities: Patient Unable To Answer (12/02/2022)  Alcohol Screen: Low Risk  (06/09/2021)  Depression (PHQ2-9): Low Risk  (08/31/2021)  Financial Resource Strain: Low Risk  (06/09/2021)  Physical Activity: Sufficiently Active (06/09/2021)  Social Connections: Socially Isolated (06/09/2021)  Stress: No Stress Concern Present (06/09/2021)  Tobacco  Use: High Risk (12/02/2022)   SDOH Interventions:     Readmission Risk Interventions     No data to display

## 2022-12-03 NOTE — Discharge Summary (Signed)
Physician Discharge Summary  [name] GNF:[id number] DOB: Aug 19, 1950 DOA: 12/01/2022  PCP: Grayce Sessions, NP  Admit date: 12/01/2022 Discharge date: 12/03/2022  Admitted From: (Home) Disposition:  [facility])  Recommendations for Outpatient Follow-up:  Management per hospice   Brief/Interim Summary:  [name] is a 72 year old gentleman with a past medical history of hypertension hyperlipidemia who was found unresponsive at the hotel he was living.  Collateral history was obtained from [name] at the bedside who reports since Wednesday, 11/28/2022 he has been pounding on the patient's door with no answer last known well Monday, 11/26/2022.  This culminated today and a wellness check in which the patient was found unresponsive reportedly upon discussion with my ER colleague arriving oriented only to self.  -In ED patient was altered, treated empirically with antibiotics, including CT head, C-spine, and CT abdomen pelvis, was significant for widely metastatic sclerotic lesions, concern for CVA was present as well, so MRI brain has been obtained, which was significant for brain metastasis, patient PSA significantly elevated at>1500.     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  He was treated with steroids during hospital stay -PSA significantly elevated -Notable myeloma workup has been sent   Traumatic rhabdomyolysis [facility]) Traumatic rhabdomyolysis Patient presents with CPK of 6035 on clinical exam no evidence of compartment syndrome  Sepsis -Workup only significant for positive UA   Acute metabolic encephalopathy -Patient significantly confused, oriented x 1, this is most likely in the setting of  widely metastatic disease, failure to thrive, and brain metastasis.  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 Bertis Ruddy, he is not a candidate for any treatment therapy, and best approach would be palliative, for which they have been consulted, after family meeting, decision has been made to proceed with full comfort measures, patient is being [facility] to [facility]     Discharge Diagnoses:  Principal Problem:   Traumatic rhabdomyolysis [facility]) Active Problems:   Severe sepsis (HCC)   CVA (cerebral vascular accident) (HCC)   Acute metabolic encephalopathy   AKI (acute kidney injury) (HCC)   Metabolic acidosis   Hypokalemia   Hypocalcemia   Normocytic anemia   Lung nodule   Malignancy (HCC)   Alcohol abuse  Thiamine deficiency   Transaminitis   Pressure ulcer   BPH (benign prostatic hyperplasia)    Essential hypertension   Hyperlipidemia   Nicotine dependence    Discharge Instructions  Discharge Instructions     Diet - low sodium heart healthy   Complete by: As directed    Discharge instructions   Complete by: As directed    Management per hospice   Increase activity slowly   Complete by: As directed    No wound care   Complete by: As directed       Allergies as of 12/03/2022   No Known Allergies      Medication List     STOP taking these medications    amLODipine 10 MG tablet Commonly known as: NORVASC   carvedilol 3.125 MG tablet Commonly known as: COREG   hydrochlorothiazide 25 MG tablet Commonly known as: HYDRODIURIL   pravastatin 20 MG tablet Commonly known as: PRAVACHOL   tamsulosin 0.4 MG Caps capsule Commonly known as: FLOMAX       TAKE these medications    haloperidol 0.5 MG tablet Commonly known as: HALDOL Take 1 tablet (0.5 mg total) by mouth every 4 (four) hours as needed for agitation (or delirium).   HYDROmorphone 1 MG/ML injection Commonly known as: DILAUDID Inject 0.5 mLs (0.5 mg total) into the vein every 6 (six) hours.   LORazepam 2 MG/ML injection Commonly known as: ATIVAN Inject 0.25 mLs (0.5 mg total) into the vein every 6 (six) hours.        No Known Allergies  Consultations: Palliative medicine   Procedures/Studies: US RENAL  Result Date: 12/02/2022 CLINICAL DATA:  Follow-up hydronephrosis. Interval placement of Foley catheter. EXAM: RENAL / URINARY TRACT ULTRASOUND COMPLETE COMPARISON:  CT abdomen and pelvis without contrast 12/02/2022. Renal ultrasound 12/01/2022. FINDINGS: Right Kidney: Renal measurements: 11.3 x 4.2 x 5.0 cm = volume: 125 mL. The renal parenchyma is isoechoic to the index organ, the liver. No focal lesions are present. No residual obstruction is present. Left Kidney: Renal measurements: 12.0 x 4.8 x 5.0 cm = volume: 147 mL. Mild residual fullness is present in the left renal collecting  system, improved. Renal parenchyma is isoechoic to the index organ, the spleen. Bladder: Urinary bladder is now decompressed around a Foley catheter. Other: None. IMPRESSION: 1. Mild fullness of the left renal collecting system, improved compared to the prior ultrasound. Right-sided hydronephrosis is resolved. 2. Urinary bladder is now decompressed around a Foley catheter. 3. Slight increased echogenicity of the renal parenchyma is nonspecific, but can be seen in the setting of medical renal disease. Electronically Signed   By: Marin Roberts M.D.   On: 12/02/2022 12:43   CT ABDOMEN PELVIS WO CONTRAST  Result Date: 12/02/2022 CLINICAL DATA:  Question bladder neck obstruction. Postrenal obstruction from cancer. EXAM: CT ABDOMEN AND PELVIS WITHOUT CONTRAST TECHNIQUE: Multidetector CT imaging of the abdomen and pelvis was performed following the standard protocol without IV contrast. RADIATION DOSE REDUCTION: This exam was performed according to the departmental dose-optimization program which includes automated exposure control, adjustment of the mA and/or kV according to patient size and/or use of iterative reconstruction technique. COMPARISON:  None Available. FINDINGS: Lower chest: Coronary atherosclerosis. Trace pleural effusions and mild atelectasis Hepatobiliary: No focal liver abnormality.No evidence of biliary obstruction or stone. Pancreas: Difficult to discretely visualize. No detected abnormality Spleen: Unremarkable. Adrenals/Urinary Tract: The adrenal glands are difficult to discretely visualize.No hydronephrosis. Right more than left renal hilar calcification which could be urinary or atheromatous.  The urinary bladder contains a Foley catheter which is normally positioned. Full decompression of the bladder. Stomach/Bowel:  No evidence of bowel obstruction . Vascular/Lymphatic: Diffuse atheromatous plaque. Limited assessment due to paucity of intra-abdominal fat but still there is evidence of  confluent adenopathy in the retroperitoneum indistinguishable from the aorta and cava. Reproductive:Symmetric mild enlargement of the prostate Other: No ascites or pneumoperitoneum. Musculoskeletal: Diffuse sclerotic appearance of the skeleton attributed to metastatic disease. IMPRESSION: 1. No hydronephrosis. A Foley catheter decompresses the urinary bladder. 2. Retroperitoneal adenopathy and widespread osseous metastatic disease, correlate with PSA. 3. Extensive atherosclerosis. Electronically Signed   By: Tiburcio Pea M.D.   On: 12/02/2022 06:02   MR BRAIN W WO CONTRAST  Result Date: 12/01/2022 CLINICAL DATA:  Neuro deficit, acute, stroke suspected. EXAM: MRI HEAD WITHOUT AND WITH CONTRAST TECHNIQUE: Multiplanar, multiecho pulse sequences of the brain and surrounding structures were obtained without and with intravenous contrast. CONTRAST:  5mL GADAVIST GADOBUTROL 1 MMOL/ML IV SOLN COMPARISON:  CT head and cervical spine 12/01/2022 FINDINGS: The study is mildly to moderately motion degraded throughout, and this reduces sensitivity for detection of small brain lesions. Brain: There are greater than 15 small enhancing lesions throughout the cerebellum with the largest measuring 9 mm in the left hemisphere (series 17, image 11). There is only minimal edema associated with some lesions, and there is no posterior fossa mass effect. A 3 mm lesion is questioned in the right parietal lobe on sagittal imaging (series 23, image 8), however this is not confirmed on coronal or axial imaging. There is mild diffuse dural thickening and enhancement associated with suspected diffuse calvarial osseous metastatic disease without significant dural nodularity. No acute infarct, intracranial hemorrhage, midline shift, or extra-axial fluid collection is identified. There is mild cerebral atrophy. No significant white matter disease is seen for age. Vascular: Major intracranial vascular flow voids are preserved. Skull and upper  cervical spine: Diffusely abnormal bone marrow signal throughout the skull and included upper cervical spine as well as portions of the mandible corresponding to sclerosis on CT. Sinuses/Orbits: Unremarkable orbits. Mild mucosal thickening in the paranasal sinuses. Small left mastoid effusion. Other: None. IMPRESSION: 1. Motion degraded examination. 2. Numerous small enhancing cerebellar lesions consistent with metastases. Minimal edema without mass effect. 3. Questionable 3 mm right parietal metastasis versus artifact or vascular enhancement. 4. Suspected widespread osteoblastic bone metastases. Electronically Signed   By: Sebastian Ache M.D.   On: 12/01/2022 20:20   US Abdomen Limited RUQ (LIVER/GB)  Result Date: 12/01/2022 CLINICAL DATA:  Elevated LFTs. EXAM: ULTRASOUND ABDOMEN LIMITED RIGHT UPPER QUADRANT COMPARISON:  None Available. FINDINGS: Gallbladder: Physiologically distended. No gallstones. 3 mm gallbladder polyp. Diffuse gallbladder wall thickening, 5-6 mm. No sonographic Murphy sign noted by sonographer. Common bile duct: Diameter: 3-4 mm, normal. Liver: No focal lesion identified. Diffusely increased in parenchymal echogenicity. No definite capsular nodularity. Portal vein is patent on color Doppler imaging with normal direction of blood flow towards the liver. Other: No right upper quadrant ascites. IMPRESSION: 1. Diffuse gallbladder wall thickening is nonspecific, may be related to chronic liver disease. No gallstones or abnormal gallbladder distension. 2. No biliary dilatation. 3. Diffusely increased hepatic parenchymal echogenicity, hepatic steatosis or other intrinsic hepatocellular disease. Electronically Signed   By: Narda Rutherford M.D.   On: 12/01/2022 17:14   US RENAL  Result Date: 12/01/2022 CLINICAL DATA:  Kidney injury. EXAM: RENAL / URINARY TRACT ULTRASOUND COMPLETE COMPARISON:  None Available. FINDINGS: Right Kidney: Renal measurements: 11.3 x 4.2 x 5.7  cm = volume: 140 mL. Mild  hydronephrosis. Borderline increased parenchymal echogenicity. No stone or focal lesion. Left Kidney: Renal measurements: 11 x 3.2 x 3.8 cm = volume: 70 mL. Mild hydronephrosis. Borderline increased parenchymal echogenicity. No stone or focal lesion. Bladder: Moderately distended. Layering dependent debris. Bladder wall appears trabeculated. Other: None. IMPRESSION: 1. Mild bilateral hydronephrosis. This may be due to bladder distension and chronic bladder outlet obstruction, the bladder appears trabeculated. 2. Layering debris in the urinary bladder, recommend correlation with urinalysis to assess for infection. Electronically Signed   By: Narda Rutherford M.D.   On: 12/01/2022 17:13   DG Shoulder Left  Result Date: 12/01/2022 CLINICAL DATA:  Traumatic rhabdomyolysis. Left shoulder and humerus pain. EXAM: LEFT HUMERUS - 2+ VIEW; LEFT SHOULDER - 2+ VIEW COMPARISON:  None Available. FINDINGS: Shoulder: No acute fracture or dislocation. Diffusely heterogeneous and sclerotic appearance of the osseous structures suspicious for metastatic disease. No soft tissue calcifications. Minor acromioclavicular degenerative change. Humerus: No humeral fracture. Heterogeneous appearance of the humerus with multiple ill-defined sclerotic densities. Soft tissues appear atrophic. Included left ribs demonstrate heterogeneous bony sclerosis. IMPRESSION: 1. No acute fracture or dislocation of the left shoulder or humerus. 2. Diffusely heterogeneous and sclerotic appearance of the osseous structures suspicious for metastatic disease. Most common etiology in the male patient of this age is prostate cancer. Recommend correlation with PSA and clinical history. Electronically Signed   By: Narda Rutherford M.D.   On: 12/01/2022 15:04   DG Humerus Left  Result Date: 12/01/2022 CLINICAL DATA:  Traumatic rhabdomyolysis. Left shoulder and humerus pain. EXAM: LEFT HUMERUS - 2+ VIEW; LEFT SHOULDER - 2+ VIEW COMPARISON:  None Available.  FINDINGS: Shoulder: No acute fracture or dislocation. Diffusely heterogeneous and sclerotic appearance of the osseous structures suspicious for metastatic disease. No soft tissue calcifications. Minor acromioclavicular degenerative change. Humerus: No humeral fracture. Heterogeneous appearance of the humerus with multiple ill-defined sclerotic densities. Soft tissues appear atrophic. Included left ribs demonstrate heterogeneous bony sclerosis. IMPRESSION: 1. No acute fracture or dislocation of the left shoulder or humerus. 2. Diffusely heterogeneous and sclerotic appearance of the osseous structures suspicious for metastatic disease. Most common etiology in the male patient of this age is prostate cancer. Recommend correlation with PSA and clinical history. Electronically Signed   By: Narda Rutherford M.D.   On: 12/01/2022 15:04   DG Pelvis Portable  Result Date: 12/01/2022 CLINICAL DATA:  Fall.  Patient found down on bathroom floor. EXAM: PORTABLE PELVIS 1-2 VIEWS COMPARISON:  None Available. FINDINGS: Diffuse sclerotic changes are present throughout the axial skeleton. No acute fractures are present. Soft tissues are unremarkable. IMPRESSION: 1. No acute abnormality. 2. Diffuse sclerotic changes throughout the axial skeleton compatible with metastatic disease. Electronically Signed   By: Marin Roberts M.D.   On: 12/01/2022 12:14   DG Chest Port 1 View  Result Date: 12/01/2022 CLINICAL DATA:  Fall. Trauma. Patient was found down on the bathroom floor. EXAM: PORTABLE CHEST 1 VIEW COMPARISON:  One-view chest x-ray 08/07/2019 FINDINGS: The heart size is normal. Changes of COPD are present. The lungs are clear. No edema or effusion is present. The visualized soft tissues and bony thorax are unremarkable. No acute fractures are present. Diffuse sclerotic changes are present throughout the axial skeleton, new since the prior exam. IMPRESSION: 1. No acute cardiopulmonary disease. 2. Diffuse sclerotic  changes throughout the axial skeleton, new since the prior exam. Findings are consistent with metastatic disease or myeloma. Electronically Signed   By: Marin Roberts  M.D.   On: 12/01/2022 12:12   CT Head Wo Contrast  Result Date: 12/01/2022 CLINICAL DATA:  Provided history: Head trauma, minor. Neck trauma. Patient found down on bathroom floor, skin breakdown noted by EMS. EXAM: CT HEAD WITHOUT CONTRAST CT CERVICAL SPINE WITHOUT CONTRAST TECHNIQUE: Multidetector CT imaging of the head and cervical spine was performed following the standard protocol without intravenous contrast. Multiplanar CT image reconstructions of the cervical spine were also generated. RADIATION DOSE REDUCTION: This exam was performed according to the departmental dose-optimization program which includes automated exposure control, adjustment of the mA and/or kV according to patient size and/or use of iterative reconstruction technique. COMPARISON:  Head CT 11/13/2019.  Neck CT 04/10/2013. FINDINGS: CT HEAD FINDING Brain: Generalized cerebral atrophy. There is no acute intracranial hemorrhage. No demarcated cortical infarct. No extra-axial fluid collection. No evidence of an intracranial mass. No midline shift. Vascular: No hyperdense vessel. Skull: Portions of the imaged calvarium, skull base, mandible and maxillofacial structures have an irregular and sclerotic appearance. No calvarial fracture. Sinuses/Orbits: No mass or acute finding within the imaged orbits. Mucosal thickening within the bilateral frontal sinuses (mild right, mild to moderate left). Mild mucosal thickening within the bilateral sphenoid sinuses. CT CERVICAL SPINE FINDINGS Alignment: Levocurvature of the cervical spine. Nonspecific straightening of the expected cervical lordosis. Mild C5-C6 grade 1 retrolisthesis. Skull base and vertebrae: The basion-dental and atlanto-dental intervals are maintained.No evidence of acute fracture to the cervical spine. Diffuse  irregular sclerotic appearance of the cervical and visualized thoracic spine, of portions of the bilateral clavicles and scapulae, and of bilateral ribs. Soft tissues and spinal canal: No prevertebral fluid or swelling. No visible canal hematoma. Disc levels: Cervical spondylosis with multilevel disc space narrowing, disc bulges/central disc protrusions and facet arthrosis. At C5-C6, there is advanced disc degeneration with a posterior disc osteophyte complex and left-sided uncovertebral hypertrophy. No appreciable high-grade spinal canal stenosis. Multilevel bony neural foraminal narrowing. Upper chest: No visible pneumothorax. Emphysema. 4 mm right upper lobe pulmonary nodule, new from the prior neck CT of 04/10/2013 (series 5, image 107). A 4 mm nodule more anteriorly within the right lung apex has not significantly changed in size as compared to the prior neck CT and is considered benign (series 5, image 111). IMPRESSION: CT head: 1.  No evidence of an acute intracranial abnormality. 2. Generalized cerebral atrophy. 3. Portions of the imaged calvarium, skull base, mandible and maxillofacial structures have an irregular and sclerotic appearance. This likely reflects sequelae of widespread osseous metastatic disease (does the patient have a history of prostate cancer?) 4. Paranasal sinus disease as described. CT cervical spine: 1. No evidence of an acute cervical spine fracture. 2. Levocurvature of the cervical spine. 3. Nonspecific straightening of the expected cervical lordosis. 4. Mild C5-C6 grade 1 retrolisthesis. 5. Cervical spondylosis as described. 6. Diffuse irregular sclerotic appearance of the cervical and visualized thoracic spine, of portions of the bilateral clavicles and scapulae, and of bilateral ribs. This likely reflects sequelae of widespread osseous metastatic disease (does the patient have a history of prostate cancer?) 7. 4 mm right upper lobe pulmonary nodule, new from the prior neck CT of  04/10/2013 (in this patient with probable widespread osseous metastatic disease). 8. Emphysema (ICD10-J43.9). Electronically Signed   By: Jackey Loge D.O.   On: 12/01/2022 11:33   CT Cervical Spine Wo Contrast  Result Date: 12/01/2022 CLINICAL DATA:  Provided history: Head trauma, minor. Neck trauma. Patient found down on bathroom floor, skin breakdown noted by EMS. EXAM:  CT HEAD WITHOUT CONTRAST CT CERVICAL SPINE WITHOUT CONTRAST TECHNIQUE: Multidetector CT imaging of the head and cervical spine was performed following the standard protocol without intravenous contrast. Multiplanar CT image reconstructions of the cervical spine were also generated. RADIATION DOSE REDUCTION: This exam was performed according to the departmental dose-optimization program which includes automated exposure control, adjustment of the mA and/or kV according to patient size and/or use of iterative reconstruction technique. COMPARISON:  Head CT 11/13/2019.  Neck CT 04/10/2013. FINDINGS: CT HEAD FINDING Brain: Generalized cerebral atrophy. There is no acute intracranial hemorrhage. No demarcated cortical infarct. No extra-axial fluid collection. No evidence of an intracranial mass. No midline shift. Vascular: No hyperdense vessel. Skull: Portions of the imaged calvarium, skull base, mandible and maxillofacial structures have an irregular and sclerotic appearance. No calvarial fracture. Sinuses/Orbits: No mass or acute finding within the imaged orbits. Mucosal thickening within the bilateral frontal sinuses (mild right, mild to moderate left). Mild mucosal thickening within the bilateral sphenoid sinuses. CT CERVICAL SPINE FINDINGS Alignment: Levocurvature of the cervical spine. Nonspecific straightening of the expected cervical lordosis. Mild C5-C6 grade 1 retrolisthesis. Skull base and vertebrae: The basion-dental and atlanto-dental intervals are maintained.No evidence of acute fracture to the cervical spine. Diffuse irregular  sclerotic appearance of the cervical and visualized thoracic spine, of portions of the bilateral clavicles and scapulae, and of bilateral ribs. Soft tissues and spinal canal: No prevertebral fluid or swelling. No visible canal hematoma. Disc levels: Cervical spondylosis with multilevel disc space narrowing, disc bulges/central disc protrusions and facet arthrosis. At C5-C6, there is advanced disc degeneration with a posterior disc osteophyte complex and left-sided uncovertebral hypertrophy. No appreciable high-grade spinal canal stenosis. Multilevel bony neural foraminal narrowing. Upper chest: No visible pneumothorax. Emphysema. 4 mm right upper lobe pulmonary nodule, new from the prior neck CT of 04/10/2013 (series 5, image 107). A 4 mm nodule more anteriorly within the right lung apex has not significantly changed in size as compared to the prior neck CT and is considered benign (series 5, image 111). IMPRESSION: CT head: 1.  No evidence of an acute intracranial abnormality. 2. Generalized cerebral atrophy. 3. Portions of the imaged calvarium, skull base, mandible and maxillofacial structures have an irregular and sclerotic appearance. This likely reflects sequelae of widespread osseous metastatic disease (does the patient have a history of prostate cancer?) 4. Paranasal sinus disease as described. CT cervical spine: 1. No evidence of an acute cervical spine fracture. 2. Levocurvature of the cervical spine. 3. Nonspecific straightening of the expected cervical lordosis. 4. Mild C5-C6 grade 1 retrolisthesis. 5. Cervical spondylosis as described. 6. Diffuse irregular sclerotic appearance of the cervical and visualized thoracic spine, of portions of the bilateral clavicles and scapulae, and of bilateral ribs. This likely reflects sequelae of widespread osseous metastatic disease (does the patient have a history of prostate cancer?) 7. 4 mm right upper lobe pulmonary nodule, new from the prior neck CT of 04/10/2013  (in this patient with probable widespread osseous metastatic disease). 8. Emphysema (ICD10-J43.9). Electronically Signed   By: Jackey Loge D.O.   On: 12/01/2022 11:33      Subjective: No significant events overnight as discussed with staff  Discharge Exam: Vitals:   12/02/22 1931 12/03/22 0816  BP: (!) 87/60 104/72  Pulse: 90 99  Resp: 10 17  Temp: 97.8 F (36.6 C) 97.9 F (36.6 C)  SpO2: 90% 93%   Vitals:   12/02/22 1200 12/02/22 1600 12/02/22 1931 12/03/22 0816  BP: 106/67 117/70 (!) 87/60 104/72  Pulse: 100 98 90 99  Resp: 16 14 10 17   Temp: 98.4 F (36.9 C) 97.8 F (36.6 C) 97.8 F (36.6 C) 97.9 F (36.6 C)  TempSrc: Oral Oral Axillary Oral  SpO2: 92% 93% 90% 93%  Weight:      Height:        General: Somnolent, minimally responsive, no apparent distress, extremely frail, chronically ill-appearing, cachectic Cardiovascular: RRR, S1/S2 +, no rubs, no gallops Respiratory: Manage air entry bilaterally i Abdominal: Soft, NT, ND, bowel sounds + Extremities: no edema, no cyanosis, multiple pressure ulcers and upper chest, right knee and left hip area, please see pictures under media section.    The results of significant diagnostics from this hospitalization (including imaging, microbiology, ancillary and laboratory) are listed below for reference.     Microbiology: Recent Results (from the past 240 hour(s))  Blood Culture (routine x 2)     Status: None (Preliminary result)   Collection Time: 12/01/22 10:57 AM   Specimen: BLOOD RIGHT WRIST  Result Value Ref Range Status   Specimen Description BLOOD RIGHT WRIST  Final   Special Requests   Final    BOTTLES DRAWN AEROBIC ONLY Blood Culture results may not be optimal due to an inadequate volume of blood received in culture bottles   Culture   Final    NO GROWTH 2 DAYS Performed at [facility], [address], [place], [place] [address]    Report Status PENDING  Incomplete  Blood Culture (routine x 2)      Status: None (Preliminary result)   Collection Time: 12/01/22  3:50 PM   Specimen: BLOOD  Result Value Ref Range Status   Specimen Description BLOOD BLOOD RIGHT ARM  Final   Special Requests   Final    BOTTLES DRAWN AEROBIC AND ANAEROBIC Blood Culture adequate volume   Culture   Final    NO GROWTH 2 DAYS Performed at [facility], [address], [place], [place] [address]    Report Status PENDING  Incomplete  Urine Culture (for pregnant, neutropenic or urologic patients or patients with an indwelling urinary catheter)     Status: None   Collection Time: 12/01/22  6:48 PM   Specimen: Urine, Catheterized  Result Value Ref Range Status   Specimen Description URINE, CATHETERIZED  Final   Special Requests Normal  Final   Culture   Final    NO GROWTH Performed at [facility], [address] [place], [place] [address]    Report Status 12/03/2022 FINAL  Final     Labs: BNP (last 3 results) No results for input(s): "BNP" in the last 8760 hours. Basic Metabolic Panel: Recent Labs  Lab 12/01/22 1045 12/01/22 1049 12/01/22 1308 12/01/22 1550 12/01/22 2118 12/02/22 0434  NA 143 146* 144  --  144 146*  K 3.9 2.6* 3.5  --  3.7 3.2*  CL 112*  --   --   --  109 115*  CO2 14*  --   --   --  20* 18*  GLUCOSE 99  --   --   --  124* 110*  BUN 55*  --   --   --  47* 40*  CREATININE 1.37*  --   --   --  1.05 1.03  CALCIUM 8.4*  --   --   --  8.4* 8.1*  MG  --   --   --  2.3  --   --  PHOS  --   --   --  2.6  --   --    Liver Function Tests: Recent Labs  Lab 12/01/22 1045 12/01/22 2118 12/02/22 0434  AST 165* 134* 113*  ALT 63* 61* 56*  ALKPHOS 395* 343* 291*  BILITOT 0.8 1.0 0.9  PROT 6.1* 5.8* 5.4*  ALBUMIN 2.3* 2.2* 2.0*   No results for input(s): "LIPASE", "AMYLASE" in the last 168 hours. Recent Labs  Lab 12/01/22 1550  AMMONIA 17   CBC: Recent Labs  Lab 12/01/22 1027 12/01/22 1049 12/01/22 1308 12/02/22 0434  WBC 6.0  --   --  4.9  NEUTROABS 4.6   --   --   --   HGB 10.7* 9.5* 10.2* 8.2*  HCT 36.1* 28.0* 30.0* 25.3*  MCV 84.9  --   --  82.4  PLT 318  --   --  247   Cardiac Enzymes: Recent Labs  Lab 12/01/22 1045 12/01/22 2118 12/02/22 0434  CKTOTAL 6,035* 4,548* 3,479*   BNP: Invalid input(s): "POCBNP" CBG: Recent Labs  Lab 12/01/22 1028  GLUCAP 75   D-Dimer No results for input(s): "DDIMER" in the last 72 hours. Hgb A1c No results for input(s): "HGBA1C" in the last 72 hours. Lipid Profile No results for input(s): "CHOL", "HDL", "LDLCALC", "TRIG", "CHOLHDL", "LDLDIRECT" in the last 72 hours. Thyroid function studies Recent Labs    12/01/22 1550  TSH 2.385   Anemia work up Recent Labs    12/01/22 1550  VITAMINB12 772   Urinalysis    Component Value Date/Time   COLORURINE YELLOW 12/01/2022 1438   APPEARANCEUR HAZY (A) 12/01/2022 1438   LABSPEC 1.016 12/01/2022 1438   PHURINE 6.0 12/01/2022 1438   GLUCOSEU NEGATIVE 12/01/2022 1438   HGBUR MODERATE (A) 12/01/2022 1438   BILIRUBINUR NEGATIVE 12/01/2022 1438   KETONESUR 20 (A) 12/01/2022 1438   PROTEINUR NEGATIVE 12/01/2022 1438   NITRITE NEGATIVE 12/01/2022 1438   LEUKOCYTESUR SMALL (A) 12/01/2022 1438   Sepsis Labs Recent Labs  Lab 12/01/22 1027 12/02/22 0434  WBC 6.0 4.9   Microbiology Recent Results (from the past 240 hour(s))  Blood Culture (routine x 2)     Status: None (Preliminary result)   Collection Time: 12/01/22 10:57 AM   Specimen: BLOOD RIGHT WRIST  Result Value Ref Range Status   Specimen Description BLOOD RIGHT WRIST  Final   Special Requests   Final    BOTTLES DRAWN AEROBIC ONLY Blood Culture results may not be optimal due to an inadequate volume of blood received in culture bottles   Culture   Final    NO GROWTH 2 DAYS Performed at [facility], [address] [place], [place] [address]    Report Status PENDING  Incomplete  Blood Culture (routine x 2)     Status: None (Preliminary result)   Collection Time:  12/01/22  3:50 PM   Specimen: BLOOD  Result Value Ref Range Status   Specimen Description BLOOD BLOOD RIGHT ARM  Final   Special Requests   Final    BOTTLES DRAWN AEROBIC AND ANAEROBIC Blood Culture adequate volume   Culture   Final    NO GROWTH 2 DAYS Performed at [facility], [address], [place], [place] [address]    Report Status PENDING  Incomplete  Urine Culture (for pregnant, neutropenic or urologic patients or patients with an indwelling urinary catheter)     Status: None   Collection Time: 12/01/22  6:48 PM  Specimen: Urine, Catheterized  Result Value Ref Range Status   Specimen Description URINE, CATHETERIZED  Final   Special Requests Normal  Final   Culture   Final    NO GROWTH Performed at [facility], [address], [place], [place] [address]    Report Status 12/03/2022 FINAL  Final     Time coordinating discharge: Over 30 minutes  SIGNED:   Huey Bienenstock, MD  [facility] 12/03/2022, 1:19 PM Pager   If 7PM-7AM, please contact night-coverage [email or web address] Password [email or web address]

## 2022-12-03 NOTE — Plan of Care (Signed)
  Problem: Respiratory: Goal: Ability to maintain adequate ventilation will improve Outcome: Progressing   Problem: Pain Managment: Goal: General experience of comfort will improve Outcome: Progressing   Problem: Safety: Goal: Ability to remain free from injury will improve Outcome: Progressing   Problem: Skin Integrity: Goal: Risk for impaired skin integrity will decrease Outcome: Progressing   

## 2022-12-03 NOTE — Progress Notes (Signed)
Redge Gainer [phone] [facility] hospital liaison note   Referral received for [facility]. Talked with [name] and hospice services and philosophy were discussed as well as answering all questions.    Patient has been approved and [facility] is able to accept patient this afternoon once consents are complete.    RN staff, you may call report at any time to [phone], room is assigned when report is called.  Please leave IV intact.    Please send completed DNR with patient.   Updated attending and [facility] manager via [employer]. Thank you for the opportunity to participate in this patient's care Thea Gist, BSN, RN Hospice nurse liaison [phone]

## 2022-12-03 NOTE — Discharge Instructions (Addendum)
Management per hospice

## 2022-12-05 LAB — PROTEIN ELECTROPHORESIS, SERUM
A/G Ratio: 0.8 (ref 0.7–1.7)
Albumin ELP: 2.4 g/dL — ABNORMAL LOW (ref 2.9–4.4)
Alpha-1-Globulin: 0.5 g/dL — ABNORMAL HIGH (ref 0.0–0.4)
Alpha-2-Globulin: 0.8 g/dL (ref 0.4–1.0)
Beta Globulin: 0.9 g/dL (ref 0.7–1.3)
Gamma Globulin: 0.8 g/dL (ref 0.4–1.8)
Globulin, Total: 3 g/dL (ref 2.2–3.9)
Total Protein ELP: 5.4 g/dL — ABNORMAL LOW (ref 6.0–8.5)

## 2022-12-06 LAB — CULTURE, BLOOD (ROUTINE X 2)
Culture: NO GROWTH
Culture: NO GROWTH
Special Requests: ADEQUATE

## 2023-01-08 DEATH — deceased
